# Patient Record
Sex: Male | Born: 1975 | Race: White | Hispanic: No | Marital: Married | Smoking: Never smoker
Health system: Southern US, Community
[De-identification: ages and names within clinical notes are randomized; demographics above are authoritative.]

## PROBLEM LIST (undated history)

## (undated) DIAGNOSIS — Z8619 Personal history of other infectious and parasitic diseases: Secondary | ICD-10-CM

## (undated) DIAGNOSIS — F32A Depression, unspecified: Secondary | ICD-10-CM

## (undated) DIAGNOSIS — F329 Major depressive disorder, single episode, unspecified: Secondary | ICD-10-CM

## (undated) DIAGNOSIS — M5126 Other intervertebral disc displacement, lumbar region: Secondary | ICD-10-CM

## (undated) DIAGNOSIS — M51369 Other intervertebral disc degeneration, lumbar region without mention of lumbar back pain or lower extremity pain: Secondary | ICD-10-CM

## (undated) DIAGNOSIS — K219 Gastro-esophageal reflux disease without esophagitis: Secondary | ICD-10-CM

## (undated) DIAGNOSIS — Z87442 Personal history of urinary calculi: Secondary | ICD-10-CM

## (undated) DIAGNOSIS — M5136 Other intervertebral disc degeneration, lumbar region: Secondary | ICD-10-CM

## (undated) HISTORY — PX: WISDOM TOOTH EXTRACTION: SHX21

## (undated) HISTORY — DX: Depression, unspecified: F32.A

## (undated) HISTORY — DX: Other intervertebral disc displacement, lumbar region: M51.26

## (undated) HISTORY — DX: Gastro-esophageal reflux disease without esophagitis: K21.9

## (undated) HISTORY — DX: Other intervertebral disc degeneration, lumbar region without mention of lumbar back pain or lower extremity pain: M51.369

## (undated) HISTORY — DX: Major depressive disorder, single episode, unspecified: F32.9

## (undated) HISTORY — DX: Personal history of other infectious and parasitic diseases: Z86.19

## (undated) HISTORY — DX: Other intervertebral disc degeneration, lumbar region: M51.36

---

## 2004-11-22 ENCOUNTER — Ambulatory Visit: Payer: Self-pay | Admitting: Internal Medicine

## 2005-02-13 ENCOUNTER — Ambulatory Visit: Payer: Self-pay | Admitting: Internal Medicine

## 2005-02-20 ENCOUNTER — Ambulatory Visit: Payer: Self-pay | Admitting: Internal Medicine

## 2006-01-08 ENCOUNTER — Ambulatory Visit: Payer: Self-pay | Admitting: Internal Medicine

## 2006-02-08 ENCOUNTER — Ambulatory Visit: Payer: Self-pay | Admitting: Internal Medicine

## 2006-07-11 ENCOUNTER — Ambulatory Visit: Payer: Self-pay | Admitting: Internal Medicine

## 2006-07-11 LAB — CONVERTED CEMR LAB: Influenza B Ag: NEGATIVE

## 2007-01-10 ENCOUNTER — Ambulatory Visit: Payer: Self-pay | Admitting: Internal Medicine

## 2007-02-21 ENCOUNTER — Ambulatory Visit: Payer: Self-pay | Admitting: Internal Medicine

## 2007-08-15 ENCOUNTER — Telehealth: Payer: Self-pay | Admitting: Internal Medicine

## 2007-08-15 ENCOUNTER — Encounter: Payer: Self-pay | Admitting: Internal Medicine

## 2008-02-05 ENCOUNTER — Ambulatory Visit: Payer: Self-pay | Admitting: Internal Medicine

## 2008-02-05 DIAGNOSIS — G4700 Insomnia, unspecified: Secondary | ICD-10-CM

## 2008-02-05 DIAGNOSIS — K5900 Constipation, unspecified: Secondary | ICD-10-CM | POA: Insufficient documentation

## 2008-05-25 ENCOUNTER — Telehealth: Payer: Self-pay | Admitting: Internal Medicine

## 2008-09-03 ENCOUNTER — Telehealth: Payer: Self-pay | Admitting: Internal Medicine

## 2009-05-04 ENCOUNTER — Ambulatory Visit: Payer: Self-pay | Admitting: Internal Medicine

## 2009-05-04 DIAGNOSIS — L259 Unspecified contact dermatitis, unspecified cause: Secondary | ICD-10-CM | POA: Insufficient documentation

## 2009-05-04 DIAGNOSIS — K589 Irritable bowel syndrome without diarrhea: Secondary | ICD-10-CM | POA: Insufficient documentation

## 2009-07-02 ENCOUNTER — Ambulatory Visit: Payer: Self-pay | Admitting: Internal Medicine

## 2009-07-02 DIAGNOSIS — E785 Hyperlipidemia, unspecified: Secondary | ICD-10-CM

## 2009-07-02 LAB — CONVERTED CEMR LAB
AST: 15 units/L (ref 0–37)
Albumin: 4.7 g/dL (ref 3.5–5.2)
Alkaline Phosphatase: 74 units/L (ref 39–117)
Calcium: 9.9 mg/dL (ref 8.4–10.5)
Creatinine, Ser: 0.96 mg/dL (ref 0.40–1.50)
HDL: 36 mg/dL — ABNORMAL LOW (ref >39)
Tissue Transglutaminase Ab, IgA: 0.4 units (ref <7)
Total Bilirubin: 0.5 mg/dL (ref 0.3–1.2)
Triglycerides: 131 mg/dL (ref <150)

## 2009-07-08 ENCOUNTER — Ambulatory Visit: Payer: Self-pay | Admitting: Internal Medicine

## 2009-07-08 DIAGNOSIS — F419 Anxiety disorder, unspecified: Secondary | ICD-10-CM

## 2009-07-08 DIAGNOSIS — F329 Major depressive disorder, single episode, unspecified: Secondary | ICD-10-CM

## 2009-10-26 ENCOUNTER — Telehealth (INDEPENDENT_AMBULATORY_CARE_PROVIDER_SITE_OTHER): Payer: Self-pay | Admitting: *Deleted

## 2009-11-24 ENCOUNTER — Telehealth: Payer: Self-pay | Admitting: Internal Medicine

## 2010-07-07 NOTE — Assessment & Plan Note (Signed)
Summary: cpx/hea   Vital Signs:  Patient profile:   35 year old male Weight:      199 pounds BMI:     28.66 O2 Sat:      100 % on Room air Temp:     97.4 degrees F oral Pulse rate:   76 / minute Pulse rhythm:   regular Resp:     16 per minute BP sitting:   130 / 80  (left arm) Cuff size:   large  Vitals Entered By: Glendell Docker CMA (July 08, 2009 8:27 AM)  O2 Flow:  Room air  Primary Care Provider:  D. Thomos Lemons DO  CC:  CPX.  History of Present Illness: CPX  35 y/o white male for routine CPX.   IBS - BMs are more regular.  no loose stools.  No blood in stools  Asthma - asymptomatic.  minimal wheezing with change of seasons atopic dermatitis -  xyzal did not help.  skin on upper neck and chest gets red when he feels stressed or nervous  Preventive Screening-Counseling & Management  Alcohol-Tobacco     Smoking Status: never  Allergies: No Known Drug Allergies  Past History:  Past Medical History: Asthma  GERD     Hx of Adjustment disorder with depressed mood IBS  Family History: Brother has hypothyroidism Mother - tobacco abuse, hypothyroidism    colon cancer - no prostate cancer - no    Social History: Occupation: Personnel officer Divorced Never Smoked Alcohol use-yes (occasional)      Physical Exam  General:  alert, well-developed, and well-nourished.   Head:  normocephalic and atraumatic.   Eyes:  pupils equal, pupils round, and pupils reactive to light.   Ears:  R ear normal and L ear normal.   Mouth:  Oral mucosa and oropharynx without lesions or exudates.  Teeth in good repair. Neck:  No deformities, masses, or tenderness noted. Lungs:  Normal respiratory effort, chest expands symmetrically. Lungs are clear to auscultation, no crackles or wheezes. Heart:  Normal rate and regular rhythm. S1 and S2 normal without gallop, murmur, click, rub or other extra sounds. Abdomen:  Bowel sounds positive,abdomen soft and non-tender without  masses, organomegaly or hernias noted. Pulses:  dorsalis pedis and posterior tibial pulses are full and equal bilaterally Extremities:  No lower extremity edema' Neurologic:  cranial nerves II-XII intact and gait normal.   Psych:  normally interactive, good eye contact, not anxious appearing, and not depressed appearing.     Impression & Recommendations:  Problem # 1:  PREVENTIVE HEALTH CARE (ICD-V70.0) Reviewed adult health maintenance protocols.  Td Booster: Historical (10/20/2003)   Flu Vax: Fluvax Non-MCR (07/08/2009)   Chol: 192 (07/02/2009)   HDL: 36 (07/02/2009)   LDL: 130 (07/02/2009)   TG: 131 (07/02/2009) TSH: 1.806 (07/02/2009)     Problem # 2:  ANXIETY (ICD-300.00) Pt gets very anxious before public speaking.  trial of low dose b blocker.  Complete Medication List: 1)  Qvar 40 Mcg/act Aers (Beclomethasone dipropionate) .... 2 puffs two times a day 2)  Proair Hfa 108 (90 Base) Mcg/act Aers (Albuterol sulfate) .... 2 puffs q 4-6 hrs as needed 3)  Triamcinolone Acetonide 0.1 % Crea (Triamcinolone acetonide) .... Apply two times a day as directed x 2 weeks 4)  Xyzal 5 Mg Tabs (Levocetirizine dihydrochloride) .... One by mouth once daily 5)  Metoprolol Tartrate 25 Mg Tabs (Metoprolol tartrate) .... 1/2 to one tab once daily as directed  Other Orders: Influenza  Vaccine NON MCR [phone]) Admin 1st Vaccine ([id number])  Patient Instructions: 1)  Please schedule a follow-up appointment as needed. Prescriptions: METOPROLOL TARTRATE 25 MG TABS (METOPROLOL TARTRATE) 1/2 to one tab once daily as directed  #30 x 1   Entered and Authorized by:   D. Thomos Lemons DO   Signed by:   D. Thomos Lemons DO on 07/08/2009   Method used:   Print then Give to Patient   RxID:   [id number]    Immunization History:  Tetanus/Td Immunization History:    Tetanus/Td:  historical (10/20/2003)  Immunizations Administered:  Influenza Vaccine # 1:    Vaccine Type: Fluvax Non-MCR    Site: right  deltoid    Mfr: GlaxoSmithKline    Dose: 0.5 ml    Route: IM    Given by: Glendell Docker CMA    Exp. Date: 12/02/2009    Lot #: [id number]    VIS given: 01/12/2009  Flu Vaccine Consent Questions:    Do you have a history of severe allergic reactions to this vaccine? no    Any prior history of allergic reactions to egg and/or gelatin? no    Do you have a sensitivity to the preservative Thimersol? no    Do you have a past history of Guillan-Barre Syndrome? no    Do you currently have an acute febrile illness? no    Have you ever had a severe reaction to latex? no    Vaccine information given and explained to patient? yes

## 2010-07-07 NOTE — Progress Notes (Signed)
  Phone Note Other Incoming   Request: Send information Summary of Call: Request for records received from [facility].Request forwarded to [facility].

## 2010-07-07 NOTE — Progress Notes (Signed)
Summary: Proair Inhaler  Phone Note Refill Request Message from:  Fax from Pharmacy on November 24, 2009 12:43 PM  Refills Requested: Medication #1:  PROAIR HFA 108 (90 BASE) MCG/ACT AERS 2 puffs q 4-6 hrs as needed   Dosage confirmed as above?Dosage Confirmed   Brand Name Necessary? No   Supply Requested: 1 month   Last Refilled: 05/02/2009  Method Requested: Electronic Next Appointment Scheduled: None Initial call taken by: Glendell Docker CMA,  November 24, 2009 12:44 PM  Follow-up for Phone Call        Pt going out of town tomorrow at [employer], needs Rx  Follow-up by: Lannette Donath,  November 25, 2009 4:46 PM  Additional Follow-up for Phone Call Additional follow up Details #1::        patient advised rx sent to pharmacy Additional Follow-up by: Glendell Docker CMA,  November 26, 2009 1:59 PM    Prescriptions: PROAIR HFA 108 (90 BASE) MCG/ACT AERS (ALBUTEROL SULFATE) 2 puffs q 4-6 hrs as needed  #1 x 5   Entered and Authorized by:   D. Thomos Lemons DO   Signed by:   D. Thomos Lemons DO on 11/25/2009   Method used:   [facility]  [address] [id number]       [address]       [place], [place]  [address]       Ph: [phone] or [phone]       Fax: [phone]   RxID:   [id number]

## 2011-03-10 ENCOUNTER — Telehealth: Payer: Self-pay | Admitting: Internal Medicine

## 2011-03-10 NOTE — Telephone Encounter (Signed)
Pt needs an OV first

## 2011-03-10 NOTE — Telephone Encounter (Signed)
Refill- Proair HFA inhaler. Use 2 puffs by mouth every 4-6 hours as needed. Qty 8.5gm. Last fill 4.21.12

## 2011-03-13 NOTE — Telephone Encounter (Signed)
ok 

## 2011-03-13 NOTE — Telephone Encounter (Signed)
Pt has scheduled an appt for 03/24/11 but said to disregard refill because he would like to talk to Dr.Yoo about changing the inhaler

## 2011-03-17 ENCOUNTER — Other Ambulatory Visit: Payer: Self-pay | Admitting: Internal Medicine

## 2011-03-17 MED ORDER — ALBUTEROL SULFATE HFA 108 (90 BASE) MCG/ACT IN AERS: 2.0000 | INHALATION_SPRAY | Freq: Four times a day (QID) | RESPIRATORY_TRACT | Status: DC | PRN

## 2011-03-17 NOTE — Telephone Encounter (Signed)
rx sent in electronically 

## 2011-03-17 NOTE — Telephone Encounter (Signed)
Pt need refill on pro-air inhaler call [facility] [address] [phone]. Pt appt was rsc until 04-17-2011.

## 2011-03-24 ENCOUNTER — Ambulatory Visit: Payer: Self-pay | Admitting: Internal Medicine

## 2011-04-17 ENCOUNTER — Ambulatory Visit: Payer: Self-pay | Admitting: Internal Medicine

## 2011-07-18 ENCOUNTER — Ambulatory Visit (INDEPENDENT_AMBULATORY_CARE_PROVIDER_SITE_OTHER): Payer: BC Managed Care – PPO | Admitting: General Surgery

## 2011-07-18 ENCOUNTER — Encounter (INDEPENDENT_AMBULATORY_CARE_PROVIDER_SITE_OTHER): Payer: Self-pay

## 2011-07-18 ENCOUNTER — Encounter (INDEPENDENT_AMBULATORY_CARE_PROVIDER_SITE_OTHER): Payer: Self-pay | Admitting: General Surgery

## 2011-07-18 VITALS — BP 150/92 | HR 96 | Resp 16 | Ht 73.0 in | Wt 199.0 lb

## 2011-07-18 DIAGNOSIS — K645 Perianal venous thrombosis: Secondary | ICD-10-CM

## 2011-07-18 NOTE — Patient Instructions (Signed)

## 2011-07-18 NOTE — Progress Notes (Signed)
Subjective:     Patient ID: Kyle Miller, male   DOB: Jan 28, 1976, 36 y.o.   MRN: [id number]  HPI 38 yom with history of prior external thrombosed hemorrhoid who presents with less than 24 hour history of rectal pain and mass.  Denies bleeding.  Having bowel movements.  He thinks this may be in same position.  He does have history of straining associated with this.  No fevers.  Review of Systems  Constitutional: Negative for fever, chills and unexpected weight change.  HENT: Negative for hearing loss, congestion, sore throat, trouble swallowing and voice change.   Eyes: Negative for visual disturbance.  Respiratory: Negative for cough and wheezing.   Cardiovascular: Negative for chest pain, palpitations and leg swelling.  Gastrointestinal: Positive for rectal pain. Negative for nausea, vomiting, abdominal pain, diarrhea, constipation, blood in stool, abdominal distention and anal bleeding.  Genitourinary: Negative for hematuria and difficulty urinating.  Musculoskeletal: Negative for arthralgias.  Skin: Negative for rash and wound.  Neurological: Negative for seizures, syncope, weakness and headaches.  Hematological: Negative for adenopathy. Does not bruise/bleed easily.  Psychiatric/Behavioral: Negative for confusion.       Objective:   Physical Exam  Vitals reviewed. Genitourinary: Rectal exam shows external hemorrhoid (tender right posterior external hemorrhoid with some necrosis, thrombosed).       Assessment:     Thrombosed external hemorrhoid    Plan:     We discussed option of observation vs evacuation vs excision. We decided on clot evacuation for symptoms.  I anesthetized this area with lidocaine and then made an incision.  I then evacuated the clot.  Dressing was placed. Return in 3 weeks or call for any problems.

## 2011-11-01 ENCOUNTER — Ambulatory Visit (INDEPENDENT_AMBULATORY_CARE_PROVIDER_SITE_OTHER): Payer: Self-pay | Admitting: Internal Medicine

## 2011-11-01 ENCOUNTER — Encounter: Payer: Self-pay | Admitting: Internal Medicine

## 2011-11-01 VITALS — BP 144/88 | HR 76 | Temp 98.1°F | Wt 191.0 lb

## 2011-11-01 DIAGNOSIS — G4700 Insomnia, unspecified: Secondary | ICD-10-CM

## 2011-11-01 DIAGNOSIS — R39198 Other difficulties with micturition: Secondary | ICD-10-CM

## 2011-11-01 DIAGNOSIS — R3912 Poor urinary stream: Secondary | ICD-10-CM

## 2011-11-01 DIAGNOSIS — R6882 Decreased libido: Secondary | ICD-10-CM

## 2011-11-01 DIAGNOSIS — F411 Generalized anxiety disorder: Secondary | ICD-10-CM

## 2011-11-01 LAB — POCT URINALYSIS DIPSTICK
Bilirubin, UA: NEGATIVE
Glucose, UA: NEGATIVE
Ketones, UA: NEGATIVE
Spec Grav, UA: 1.015

## 2011-11-01 MED ORDER — SERTRALINE HCL 25 MG PO TABS: 25.0000 mg | ORAL_TABLET | Freq: Every day | ORAL | Status: DC

## 2011-11-01 MED ORDER — CLONAZEPAM 0.5 MG PO TABS: 0.5000 mg | ORAL_TABLET | Freq: Every evening | ORAL | Status: DC | PRN

## 2011-11-01 MED ORDER — ALBUTEROL SULFATE HFA 108 (90 BASE) MCG/ACT IN AERS: 2.0000 | INHALATION_SPRAY | Freq: Four times a day (QID) | RESPIRATORY_TRACT | Status: DC | PRN

## 2011-11-01 MED ORDER — CIPROFLOXACIN HCL 250 MG PO TABS: 250.0000 mg | ORAL_TABLET | Freq: Two times a day (BID) | ORAL | Status: AC

## 2011-11-01 NOTE — Progress Notes (Signed)
  Subjective:    Patient ID: Kyle Miller, male    DOB: 1975/10/09, 36 y.o.   MRN: [id number]  HPI  36 year old white male with history of asthma returns with multiple complaints. First, patient requests refill for albuterol metered-dose inhaler. He denies frequent asthma flares. It has been over 5-6 months since he last used albuterol.  Patient has been concerned with new genitourinary symptoms. Patient complains of weaker urinary stream. Patient also feels like his testicles and penis have gotten smaller. He denies any significant issues with sexual function. Question mild decrease in libido. He works third shift and has difficulty sleeping. He averages 3 hours per night.  He has chronic history of anxiety disorder. He is not  taking any medication. He has been worrying excessively about what is causing his genitourinary issues. He denies any new sexual partners. No dysuria or testicular pain.   Review of Systems Negative for fever or chills,  No urinary frequency  Past Medical History  Diagnosis Date  . Asthma     History   Social History  . Marital Status: Married    Spouse Name: N/A    Number of Children: N/A  . Years of Education: N/A   Occupational History  . Not on file.   Social History Main Topics  . Smoking status: Never Smoker   . Smokeless tobacco: Not on file  . Alcohol Use: Yes     socially  . Drug Use: No  . Sexually Active: Not on file   Other Topics Concern  . Not on file   Social History Narrative  . No narrative on file    No past surgical history on file.  No family history on file.  No Known Allergies  Current Outpatient Prescriptions on File Prior to Visit  Medication Sig Dispense Refill  . clonazePAM (KLONOPIN) 0.5 MG tablet Take 1 tablet (0.5 mg total) by mouth at bedtime as needed (as needed for sleep).  30 tablet  1  . sertraline (ZOLOFT) 25 MG tablet Take 1 tablet (25 mg total) by mouth daily.  30 tablet  2    BP 144/88  Pulse 76   Temp(Src) 98.1 F (36.7 C) (Oral)  Wt 191 lb (86.637 kg)       Objective:   Physical Exam  Constitutional: He is oriented to person, place, and time. He appears well-developed and well-nourished.  Cardiovascular: Normal rate, regular rhythm and normal heart sounds.   No murmur heard. Pulmonary/Chest: Effort normal and breath sounds normal. He has no wheezes.  Genitourinary: Rectum normal and penis normal.       Slightly boggy prostate gland.  No asymmetry or prostate nodules  Neurological: He is alert and oriented to person, place, and time.  Skin: Skin is warm and dry.  Psychiatric:       Appears anxious          Assessment & Plan:

## 2011-11-01 NOTE — Assessment & Plan Note (Signed)
Gen. exam is normal. Rule out hypogonadism. Obtain testosterone level. The symptoms may be anxiety related.

## 2011-11-01 NOTE — Assessment & Plan Note (Signed)
Restart SSRI. Sertraline 25 mg once daily.

## 2011-11-01 NOTE — Assessment & Plan Note (Addendum)
Patient could not tolerate zolpidem in the past.  It caused hallucinations. Trial of low-dose clonazepam. His insomnia is exacerbated by work schedule. He works third shift.

## 2011-11-01 NOTE — Patient Instructions (Signed)
Avoid over the counter decongestants and caffeinated beverages.

## 2011-11-01 NOTE — Assessment & Plan Note (Signed)
36 year old white male with symptoms of weak urinary stream. On exam patient has slightly boggy prostate. Consider mild prostatitis. Treat with Cipro 250 mg twice daily x10 days. Patient also advised to avoid decongestants and caffeinated beverages.

## 2011-11-02 LAB — CBC WITH DIFFERENTIAL/PLATELET
Basophils Absolute: 0 10*3/uL (ref 0.0–0.1)
Eosinophils Absolute: 0.2 10*3/uL (ref 0.0–0.7)
Hemoglobin: 15.2 g/dL (ref 13.0–17.0)
Lymphocytes Relative: 21.7 % (ref 12.0–46.0)
Lymphs Abs: 2.1 10*3/uL (ref 0.7–4.0)
MCHC: 32.5 g/dL (ref 30.0–36.0)
MCV: 92 fl (ref 78.0–100.0)
Monocytes Absolute: 0.5 10*3/uL (ref 0.1–1.0)
Neutro Abs: 7 10*3/uL (ref 1.4–7.7)
RDW: 13.2 % (ref 11.5–14.6)

## 2011-11-02 LAB — TESTOSTERONE, FREE, TOTAL, SHBG: Sex Hormone Binding: 40 nmol/L (ref 13–71)

## 2012-01-22 ENCOUNTER — Telehealth: Payer: Self-pay | Admitting: Internal Medicine

## 2012-01-22 NOTE — Telephone Encounter (Signed)
Pt calling in for lab results from 07/02/2009 for Wellness program at work.  Pt needed HDL, Triglycerides, LDL, and Glucose level. No triage.

## 2012-02-13 ENCOUNTER — Ambulatory Visit (INDEPENDENT_AMBULATORY_CARE_PROVIDER_SITE_OTHER): Payer: Self-pay | Admitting: Family

## 2012-02-13 ENCOUNTER — Encounter: Payer: Self-pay | Admitting: Family

## 2012-02-13 VITALS — BP 130/86 | HR 65 | Temp 98.0°F | Resp 18 | Wt 198.0 lb

## 2012-02-13 DIAGNOSIS — H6090 Unspecified otitis externa, unspecified ear: Secondary | ICD-10-CM

## 2012-02-13 DIAGNOSIS — H6692 Otitis media, unspecified, left ear: Secondary | ICD-10-CM | POA: Insufficient documentation

## 2012-02-13 DIAGNOSIS — H60399 Other infective otitis externa, unspecified ear: Secondary | ICD-10-CM

## 2012-02-13 DIAGNOSIS — H6690 Otitis media, unspecified, unspecified ear: Secondary | ICD-10-CM

## 2012-02-13 MED ORDER — CIPROFLOXACIN-HYDROCORTISONE 0.2-1 % OT SUSP: 3.0000 [drp] | Freq: Two times a day (BID) | OTIC | Status: AC

## 2012-02-13 MED ORDER — AMOXICILLIN-POT CLAVULANATE 875-125 MG PO TABS: 1.0000 | ORAL_TABLET | Freq: Two times a day (BID) | ORAL | Status: AC

## 2012-02-13 NOTE — Progress Notes (Signed)
  Subjective:    Patient ID: Kyle Miller, male    DOB: Sep 24, 1975, 36 y.o.   MRN: [id number]  HPI  Mr.  Miller is a 36 yr old male who presents today with chief complaint of left ear pain.  Pain has been present x 2 weeks.  He completed a zpak with slight improvement in his symptoms.  However, yesterday he developed difficulty hearing out of the left ear.   Mild nasal drainage- clear.  Denies associated fever.  Has had sharp shooting pains out of the left ear.   Review of Systems    see HPI  Past Medical History  Diagnosis Date  . Asthma     History   Social History  . Marital Status: Married    Spouse Name: N/A    Number of Children: N/A  . Years of Education: N/A   Occupational History  . Not on file.   Social History Main Topics  . Smoking status: Never Smoker   . Smokeless tobacco: Never Used  . Alcohol Use: Yes     socially  . Drug Use: No  . Sexually Active: Not on file   Other Topics Concern  . Not on file   Social History Narrative  . No narrative on file    No past surgical history on file.  No family history on file.  No Known Allergies  Current Outpatient Prescriptions on File Prior to Visit  Medication Sig Dispense Refill  . albuterol (PROVENTIL HFA;VENTOLIN HFA) 108 (90 BASE) MCG/ACT inhaler Inhale 2 puffs into the lungs every 6 (six) hours as needed for wheezing.  1 Inhaler  5  . DISCONTD: sertraline (ZOLOFT) 25 MG tablet Take 1 tablet (25 mg total) by mouth daily.  30 tablet  2  . clonazePAM (KLONOPIN) 0.5 MG tablet Take 1 tablet (0.5 mg total) by mouth at bedtime as needed (as needed for sleep).  30 tablet  1    BP 130/86  Pulse 65  Temp 98 F (36.7 C) (Oral)  Resp 18  Wt 198 lb 0.6 oz (89.83 kg)  SpO2 99%    Objective:   Physical Exam  Constitutional: He is oriented to person, place, and time. He appears well-developed and well-nourished. No distress.  HENT:  Head: Normocephalic and atraumatic.  Right Ear: Hearing and tympanic  membrane normal.  Left Ear: Tympanic membrane is erythematous and bulging.  Mouth/Throat: No oropharyngeal exudate, posterior oropharyngeal edema or posterior oropharyngeal erythema.       White exudate in left canal with erythematous canal.    Cardiovascular: Normal rate and regular rhythm.   No murmur heard. Pulmonary/Chest: Effort normal and breath sounds normal. No respiratory distress. He has no wheezes. He has no rales. He exhibits no tenderness.  Neurological: He is alert and oriented to person, place, and time.  Skin: Skin is warm and dry. No rash noted. No erythema. No pallor.  Psychiatric: He has a normal mood and affect. His behavior is normal. Judgment and thought content normal.          Assessment & Plan:

## 2012-02-13 NOTE — Assessment & Plan Note (Signed)
Trial of cipro HC otic drops to left ear.

## 2012-02-13 NOTE — Assessment & Plan Note (Signed)
Will rx with Augmentin. 

## 2012-02-13 NOTE — Patient Instructions (Addendum)
Please call if symptoms worsen or if no improvement in 2-3 days.  

## 2012-10-03 ENCOUNTER — Ambulatory Visit (INDEPENDENT_AMBULATORY_CARE_PROVIDER_SITE_OTHER): Payer: BC Managed Care – PPO | Admitting: Internal Medicine

## 2012-10-03 ENCOUNTER — Encounter: Payer: Self-pay | Admitting: Internal Medicine

## 2012-10-03 VITALS — BP 138/98 | Temp 98.3°F | Ht 73.0 in | Wt 207.0 lb

## 2012-10-03 DIAGNOSIS — N419 Inflammatory disease of prostate, unspecified: Secondary | ICD-10-CM

## 2012-10-03 DIAGNOSIS — R5381 Other malaise: Secondary | ICD-10-CM

## 2012-10-03 DIAGNOSIS — F411 Generalized anxiety disorder: Secondary | ICD-10-CM

## 2012-10-03 DIAGNOSIS — R6882 Decreased libido: Secondary | ICD-10-CM

## 2012-10-03 DIAGNOSIS — R5383 Other fatigue: Secondary | ICD-10-CM

## 2012-10-03 LAB — POCT URINALYSIS DIPSTICK
Bilirubin, UA: NEGATIVE
Blood, UA: NEGATIVE
Ketones, UA: NEGATIVE
Leukocytes, UA: NEGATIVE
pH, UA: 6

## 2012-10-03 LAB — CBC WITH DIFFERENTIAL/PLATELET
Eosinophils Absolute: 0.6 10*3/uL (ref 0.0–0.7)
MCHC: 33.7 g/dL (ref 30.0–36.0)
MCV: 88.6 fl (ref 78.0–100.0)
Monocytes Absolute: 0.6 10*3/uL (ref 0.1–1.0)
Neutrophils Relative %: 59.1 % (ref 43.0–77.0)
Platelets: 308 10*3/uL (ref 150.0–400.0)
WBC: 10.3 10*3/uL (ref 4.5–10.5)

## 2012-10-03 LAB — BASIC METABOLIC PANEL
BUN: 8 mg/dL (ref 6–23)
Chloride: 102 mEq/L (ref 96–112)
Creatinine, Ser: 1 mg/dL (ref 0.4–1.5)

## 2012-10-03 LAB — HEPATIC FUNCTION PANEL
Bilirubin, Direct: 0.1 mg/dL (ref 0.0–0.3)
Total Bilirubin: 0.8 mg/dL (ref 0.3–1.2)

## 2012-10-03 LAB — T4, FREE: Free T4: 0.87 ng/dL (ref 0.60–1.60)

## 2012-10-03 MED ORDER — CIPROFLOXACIN HCL 500 MG PO TABS: 500.0000 mg | ORAL_TABLET | Freq: Two times a day (BID) | ORAL | Status: DC

## 2012-10-03 MED ORDER — CLONAZEPAM 1 MG PO TABS: 1.0000 mg | ORAL_TABLET | Freq: Every evening | ORAL | Status: DC | PRN

## 2012-10-03 MED ORDER — BUPROPION HCL ER (XL) 150 MG PO TB24: 150.0000 mg | ORAL_TABLET | Freq: Every day | ORAL | Status: DC

## 2012-10-03 MED ORDER — ALBUTEROL SULFATE HFA 108 (90 BASE) MCG/ACT IN AERS: 2.0000 | INHALATION_SPRAY | Freq: Four times a day (QID) | RESPIRATORY_TRACT | Status: DC | PRN

## 2012-10-03 NOTE — Progress Notes (Signed)
  Subjective:    Patient ID: Kyle Miller, male    DOB: 07-29-1975, 37 y.o.   MRN: [id number]  HPI  37 year old white male with history of anxiety presents with fatigue and mood swings. His symptoms have been ongoing for several months. He also reports intermittent urinary symptoms. He has occasional weak urine stream. He also complains of hot flashes and feeling cold. He is also experienced occasional hip pain and low back pain.  He has history of anxiety and was previously started on Zoloft. Zoloft 25 mg caused significant diarrhea. His symptoms resolved after discontinuing medication.  He has chronic anxiety. He also works different shifts.  He reports very poor sleep quality.  "I am lucky if I sleep 3 hrs per night."  Review of Systems Negative for fever or chills,  Chronic insomnia    Past Medical History  Diagnosis Date  . Asthma     History   Social History  . Marital Status: Married    Spouse Name: N/A    Number of Children: N/A  . Years of Education: N/A   Occupational History  . Not on file.   Social History Main Topics  . Smoking status: Never Smoker   . Smokeless tobacco: Never Used  . Alcohol Use: Yes     Comment: socially  . Drug Use: No  . Sexually Active: Not on file   Other Topics Concern  . Not on file   Social History Narrative  . No narrative on file    No past surgical history on file.  No family history on file.  No Known Allergies  No current outpatient prescriptions on file prior to visit.   No current facility-administered medications on file prior to visit.    BP 138/98  Temp(Src) 98.3 F (36.8 C) (Oral)  Ht 6\' 1"  (1.854 m)  Wt 207 lb (93.895 kg)  BMI 27.32 kg/m2    Objective:   Physical Exam  Constitutional: He is oriented to person, place, and time. He appears well-developed.  HENT:  Head: Normocephalic and atraumatic.  Right Ear: External ear normal.  Left Ear: External ear normal.  Mouth/Throat: Oropharynx is clear  and moist.  Cardiovascular: Normal rate, regular rhythm and normal heart sounds.   Pulmonary/Chest: Effort normal. He has no wheezes.  Abdominal: Soft. Bowel sounds are normal. He exhibits no mass. There is no tenderness.  Genitourinary: Rectum normal.  Slightly boggy tender prostate  Neurological: He is alert and oriented to person, place, and time. No cranial nerve deficit.  Skin: Skin is warm and dry.  Psychiatric: He has a normal mood and affect. His behavior is normal.          Assessment & Plan:

## 2012-10-03 NOTE — Assessment & Plan Note (Signed)
Testosterone levels were normal.  His symptoms likely secondary to stress.

## 2012-10-03 NOTE — Assessment & Plan Note (Signed)
Patient could not tolerate sertraline due to side effect of diarrhea. Trial of Wellbutrin XL 150 mg once daily.  Patient with symptoms of apathy and lethargy.  Rule out metabolic etiology.  He reports family hx of hypothyroidism.   Poor sleep quality due to the fact patient works different shifts likely contributing to mood issues.  He could not tolerate Ambien. Use clonazepam 0.5-1 mg at bedtime as needed

## 2012-10-03 NOTE — Assessment & Plan Note (Signed)
37 year old white male with intermittent urinary symptoms. He also has associated low back pain and hip pain. He has tender boggy prostate on exam. Treat with ciprofloxacin 500 mg twice daily for 10 days.

## 2012-11-07 ENCOUNTER — Ambulatory Visit: Payer: BC Managed Care – PPO | Admitting: Internal Medicine

## 2012-11-25 ENCOUNTER — Ambulatory Visit: Payer: BC Managed Care – PPO | Admitting: Internal Medicine

## 2013-01-08 ENCOUNTER — Other Ambulatory Visit: Payer: Self-pay | Admitting: Internal Medicine

## 2013-02-07 ENCOUNTER — Ambulatory Visit (INDEPENDENT_AMBULATORY_CARE_PROVIDER_SITE_OTHER): Payer: BC Managed Care – PPO | Admitting: Internal Medicine

## 2013-02-07 ENCOUNTER — Encounter: Payer: Self-pay | Admitting: Internal Medicine

## 2013-02-07 VITALS — BP 128/90 | Temp 98.0°F | Wt 202.0 lb

## 2013-02-07 DIAGNOSIS — F411 Generalized anxiety disorder: Secondary | ICD-10-CM

## 2013-02-07 DIAGNOSIS — N419 Inflammatory disease of prostate, unspecified: Secondary | ICD-10-CM

## 2013-02-07 DIAGNOSIS — G4700 Insomnia, unspecified: Secondary | ICD-10-CM

## 2013-02-07 DIAGNOSIS — Z23 Encounter for immunization: Secondary | ICD-10-CM

## 2013-02-07 DIAGNOSIS — J45909 Unspecified asthma, uncomplicated: Secondary | ICD-10-CM

## 2013-02-07 LAB — POCT URINALYSIS DIPSTICK
Bilirubin, UA: NEGATIVE
Blood, UA: NEGATIVE
Glucose, UA: NEGATIVE
Nitrite, UA: NEGATIVE

## 2013-02-07 MED ORDER — BECLOMETHASONE DIPROPIONATE 80 MCG/ACT IN AERS: 2.0000 | INHALATION_SPRAY | Freq: Two times a day (BID) | RESPIRATORY_TRACT | Status: DC

## 2013-02-07 MED ORDER — ALBUTEROL SULFATE HFA 108 (90 BASE) MCG/ACT IN AERS: 2.0000 | INHALATION_SPRAY | Freq: Four times a day (QID) | RESPIRATORY_TRACT | Status: DC | PRN

## 2013-02-07 MED ORDER — PANTOPRAZOLE SODIUM 40 MG PO TBEC: 40.0000 mg | DELAYED_RELEASE_TABLET | Freq: Every day | ORAL | Status: DC

## 2013-02-07 MED ORDER — CIPROFLOXACIN HCL 500 MG PO TABS: 500.0000 mg | ORAL_TABLET | Freq: Two times a day (BID) | ORAL | Status: DC

## 2013-02-07 MED ORDER — BUPROPION HCL ER (XL) 150 MG PO TB24: 150.0000 mg | ORAL_TABLET | ORAL | Status: DC

## 2013-02-07 MED ORDER — CLONAZEPAM 1 MG PO TABS: 1.0000 mg | ORAL_TABLET | Freq: Every evening | ORAL | Status: DC | PRN

## 2013-02-07 NOTE — Assessment & Plan Note (Signed)
Patient experiencing recurrence of prostatitis. Treat with ciprofloxacin 500 mg twice daily for 3 weeks.

## 2013-02-07 NOTE — Progress Notes (Signed)
  Subjective:    Patient ID: Kyle Miller, male    DOB: 06-22-1975, 37 y.o.   MRN: [id number]  HPI  37 year old white male with history of anxiety/fatigue and mood swings for followup. Patient previously started on Wellbutrin XL 150 mg. Patient reports energy levels have improved as his anxiety symptoms. He has been monitoring his blood pressure at home and they're somewhat labile.  He was previously treated for possible prostatitis. He reports urinary symptoms promptly resolved after taking course of ciprofloxacin for 10 days.  Unfortunately patient reports prostate symptoms recurred 2 weeks ago. He has intermittent pelvic discomfort and low back pain. His urine flow also feels "tight". He has pelvic muscle tightness. He denies any new sexual contacts. No joint pains or low-grade fever.  History of asthma -patient has been using his inhaler more than usual the summer. It has slowed down over the last one month. He reports chronic reflux symptoms. He takes over-the-counter proton pump inhibitor when necessary.  Review of Systems Negative for cough or shortness of breath. No hx of STDs Negative for chest pain  Past Medical History  Diagnosis Date  . Asthma     History   Social History  . Marital Status: Married    Spouse Name: N/A    Number of Children: N/A  . Years of Education: N/A   Occupational History  . Not on file.   Social History Main Topics  . Smoking status: Never Smoker   . Smokeless tobacco: Never Used  . Alcohol Use: Yes     Comment: socially  . Drug Use: No  . Sexual Activity: Not on file   Other Topics Concern  . Not on file   Social History Narrative  . No narrative on file    No past surgical history on file.  No family history on file.  No Known Allergies  No current outpatient prescriptions on file prior to visit.   No current facility-administered medications on file prior to visit.    BP 128/90  Temp(Src) 98 F (36.7 C) (Oral)  Wt  202 lb (91.627 kg)  BMI 26.66 kg/m2       Objective:   Physical Exam  Constitutional: He is oriented to person, place, and time. He appears well-developed and well-nourished.  HENT:  Head: Normocephalic and atraumatic.  Neck: Neck supple.  Cardiovascular: Normal rate and normal heart sounds.   Pulmonary/Chest: Effort normal and breath sounds normal. He has no wheezes.  Abdominal: Soft. Bowel sounds are normal.  Genitourinary: Rectum normal.  Prostate boggy and tender  Musculoskeletal: He exhibits no edema.  Lymphadenopathy:    He has no cervical adenopathy.  Neurological: He is alert and oriented to person, place, and time. No cranial nerve deficit.  Skin: Skin is warm and dry.  Psychiatric: He has a normal mood and affect. His behavior is normal.          Assessment & Plan:

## 2013-02-07 NOTE — Assessment & Plan Note (Signed)
Improved with Wellbutrin XL 150 mg. Continue same dose.

## 2013-02-07 NOTE — Assessment & Plan Note (Addendum)
Patient reports more exacerbations early this summer. He has associated reflux. Start protonix 40 mg once daily. Also start Qvar 80 mcg 2 puffs twice daily. Reassess in 2 months.  Consider spirometry at next office visit.

## 2013-02-07 NOTE — Assessment & Plan Note (Signed)
Patient using clonazepam 1 mg 1/2-1 tablet as needed. Insomnia sometimes exacerbated by shift work. Continue same dose of clonazepam.

## 2013-02-10 ENCOUNTER — Telehealth: Payer: Self-pay | Admitting: Internal Medicine

## 2013-02-10 MED ORDER — CIPROFLOXACIN HCL 500 MG PO TABS: 500.0000 mg | ORAL_TABLET | Freq: Two times a day (BID) | ORAL | Status: DC

## 2013-02-10 NOTE — Telephone Encounter (Signed)
rx corrected and sent in electronically 

## 2013-02-10 NOTE — Telephone Encounter (Signed)
It is actually 500 mg bid for 21 days, so call in Qty 42.  Thank you.

## 2013-02-10 NOTE — Telephone Encounter (Signed)
[facility] that [facility] [address] [facility] his [facility]floxacin (CIPRO) 500 MG tablet incorrectly. He states that he was supposed to take this 2 times daily for 28 days, but he only received 28 pills. When he made the pharmacy aware of this mistake, they requested that he let us know so that we could send another RX over. Please assist.

## 2013-05-10 ENCOUNTER — Other Ambulatory Visit: Payer: Self-pay | Admitting: Internal Medicine

## 2013-05-12 ENCOUNTER — Other Ambulatory Visit: Payer: Self-pay | Admitting: Internal Medicine

## 2013-07-02 ENCOUNTER — Encounter: Payer: Self-pay | Admitting: Internal Medicine

## 2013-07-02 ENCOUNTER — Ambulatory Visit (INDEPENDENT_AMBULATORY_CARE_PROVIDER_SITE_OTHER): Payer: BC Managed Care – PPO | Admitting: Internal Medicine

## 2013-07-02 VITALS — BP 132/98 | HR 80 | Temp 97.8°F | Ht 73.0 in | Wt 207.0 lb

## 2013-07-02 DIAGNOSIS — M791 Myalgia, unspecified site: Secondary | ICD-10-CM

## 2013-07-02 DIAGNOSIS — M6281 Muscle weakness (generalized): Secondary | ICD-10-CM | POA: Insufficient documentation

## 2013-07-02 DIAGNOSIS — N419 Inflammatory disease of prostate, unspecified: Secondary | ICD-10-CM

## 2013-07-02 DIAGNOSIS — IMO0001 Reserved for inherently not codable concepts without codable children: Secondary | ICD-10-CM

## 2013-07-02 LAB — BASIC METABOLIC PANEL
BUN: 8 mg/dL (ref 6–23)
CHLORIDE: 102 meq/L (ref 96–112)
CO2: 31 meq/L (ref 19–32)
Calcium: 9.4 mg/dL (ref 8.4–10.5)
Creatinine, Ser: 1 mg/dL (ref 0.4–1.5)
GFR: 90.46 mL/min (ref >60.00)
GLUCOSE: 82 mg/dL (ref 70–99)
POTASSIUM: 3.9 meq/L (ref 3.5–5.1)
SODIUM: 139 meq/L (ref 135–145)

## 2013-07-02 LAB — T4, FREE: Free T4: 0.7 ng/dL (ref 0.60–1.60)

## 2013-07-02 LAB — SEDIMENTATION RATE: Sed Rate: 1 mm/hr (ref 0–22)

## 2013-07-02 LAB — HEPATIC FUNCTION PANEL
ALK PHOS: 73 U/L (ref 39–117)
ALT: 33 U/L (ref 0–53)
AST: 20 U/L (ref 0–37)
Albumin: 4.4 g/dL (ref 3.5–5.2)
BILIRUBIN DIRECT: 0 mg/dL (ref 0.0–0.3)
BILIRUBIN TOTAL: 0.6 mg/dL (ref 0.3–1.2)
Total Protein: 7.6 g/dL (ref 6.0–8.3)

## 2013-07-02 LAB — CK: Total CK: 98 U/L (ref 7–232)

## 2013-07-02 LAB — TSH: TSH: 0.66 u[IU]/mL (ref 0.35–5.50)

## 2013-07-02 MED ORDER — DOXYCYCLINE HYCLATE 100 MG PO TABS: 100.0000 mg | ORAL_TABLET | Freq: Two times a day (BID) | ORAL | Status: DC

## 2013-07-02 NOTE — Progress Notes (Signed)
Pre visit review using our clinic review tool, if applicable. No additional management support is needed unless otherwise documented below in the visit note. 

## 2013-07-02 NOTE — Assessment & Plan Note (Signed)
38 year old white male with unexplained lower extremity weakness after starting Wellbutrin. Patient also unclear whether symptoms started after getting influenza vaccine. He has mild fatigability with performing multiple squats on exam. Otherwise his neurologic exam is unremarkable. Check CPK, sedimentation rate and thyroid studies. Refer to neurology for further evaluation/possible EMG.

## 2013-07-02 NOTE — Assessment & Plan Note (Signed)
Patient with recurrent symptoms.  Treat with doxycycline 100 mg bid for 3 weeks.

## 2013-07-02 NOTE — Progress Notes (Signed)
Subjective:    Patient ID: Kyle Miller, male    DOB: December 16, 1975, 38 y.o.   MRN: [id number]  HPI  38 year old white male with history of mild asthma since an anxiety disorder for routine followup. Patient treated for possible mild prostatitis at previous visit. His symptoms resolved with finishing course of Cipro however symptoms of weak urine stream and urinary frequency have returned.  Patient reports discontinuing Wellbutrin XL 150 mg. Patient reports his muscles started aching after starting medication. Patient reports symptoms somewhat improved after stopping Wellbutrin however patient still experiencing intermittent muscle pain and weakness. He describes symptoms limited to hip girdle, hamstrings and quadriceps.  He describes  "lactic acid burn" with activity.  On some days, he is asymptomatic and denies muscle fatigue.  He is not sure whether his symptoms started after getting flu vaccine.   Review of Systems Intermittent low back pain,  No abnormal sensation.  He denies dark urine    Past Medical History  Diagnosis Date  . Asthma     History   Social History  . Marital Status: Married    Spouse Name: N/A    Number of Children: N/A  . Years of Education: N/A   Occupational History  . Not on file.   Social History Main Topics  . Smoking status: Never Smoker   . Smokeless tobacco: Never Used  . Alcohol Use: Yes     Comment: socially  . Drug Use: No  . Sexual Activity: Not on file   Other Topics Concern  . Not on file   Social History Narrative  . No narrative on file    No past surgical history on file.  No family history on file.  No Known Allergies  Current Outpatient Prescriptions on File Prior to Visit  Medication Sig Dispense Refill  . albuterol (PROVENTIL HFA;VENTOLIN HFA) 108 (90 BASE) MCG/ACT inhaler Inhale 2 puffs into the lungs every 6 (six) hours as needed for wheezing.  1 Inhaler  5  . beclomethasone (QVAR) 80 MCG/ACT inhaler Inhale 2 puffs  into the lungs 2 (two) times daily.  1 Inhaler  5  . clonazePAM (KLONOPIN) 1 MG tablet Take 1 tablet (1 mg total) by mouth at bedtime as needed.  30 tablet  2   No current facility-administered medications on file prior to visit.    BP 132/98  Pulse 80  Temp(Src) 97.8 F (36.6 C) (Oral)  Ht 6\' 1"  (1.854 m)  Wt 207 lb (93.895 kg)  BMI 27.32 kg/m2    Objective:   Physical Exam  Constitutional: He is oriented to person, place, and time. He appears well-developed and well-nourished. No distress.  HENT:  Head: Normocephalic and atraumatic.  Mouth/Throat: Oropharynx is clear and moist.  Neck: Neck supple.  Cardiovascular: Normal rate, regular rhythm and normal heart sounds.   No murmur heard. Pulmonary/Chest: Effort normal and breath sounds normal. He has no wheezes.  Abdominal: Soft. Bowel sounds are normal. He exhibits no mass. There is no tenderness.  Musculoskeletal: He exhibits no edema and no tenderness.  Lymphadenopathy:    He has no cervical adenopathy.  Neurological: He is alert and oriented to person, place, and time. He has normal reflexes. He displays normal reflexes. No cranial nerve deficit. He exhibits normal muscle tone. Coordination normal.  Normal upper extremity strength 5/5 bilaterally.  Mild fatigability with repeated squats  Skin: Skin is warm and dry.  Psychiatric: He has a normal mood and affect. His behavior is normal.  Assessment & Plan:

## 2013-07-04 LAB — ALDOLASE: Aldolase: 4.6 U/L (ref <=8.1)

## 2013-07-17 ENCOUNTER — Other Ambulatory Visit: Payer: Self-pay | Admitting: Internal Medicine

## 2013-08-11 ENCOUNTER — Ambulatory Visit: Payer: BC Managed Care – PPO | Admitting: Neurology

## 2013-08-26 ENCOUNTER — Ambulatory Visit: Payer: BC Managed Care – PPO | Admitting: Neurology

## 2013-09-22 ENCOUNTER — Ambulatory Visit: Payer: BC Managed Care – PPO | Admitting: Neurology

## 2013-10-29 ENCOUNTER — Ambulatory Visit (INDEPENDENT_AMBULATORY_CARE_PROVIDER_SITE_OTHER): Payer: BC Managed Care – PPO | Admitting: Physician Assistant

## 2013-10-29 ENCOUNTER — Encounter: Payer: Self-pay | Admitting: Physician Assistant

## 2013-10-29 VITALS — BP 142/90 | HR 69 | Temp 98.2°F | Resp 18 | Ht 73.0 in | Wt 202.2 lb

## 2013-10-29 DIAGNOSIS — J45909 Unspecified asthma, uncomplicated: Secondary | ICD-10-CM

## 2013-10-29 DIAGNOSIS — L259 Unspecified contact dermatitis, unspecified cause: Secondary | ICD-10-CM

## 2013-10-29 MED ORDER — PREDNISONE 20 MG PO TABS: ORAL_TABLET | ORAL | Status: DC

## 2013-10-29 NOTE — Progress Notes (Signed)
Pre visit review using our clinic review tool, if applicable. No additional management support is needed unless otherwise documented below in the visit note/SLS  

## 2013-10-29 NOTE — Patient Instructions (Signed)
Please take prednisone as directed until all tablets are gone.  Apply cool compresses and Sarna lotion to help with itch.  Avoid scratching.   For asthma -- continue rescue inhaler as needed.  Start Qvar 80 mcg -- take 1 puff twice daily.  Follow-up with Dr. Shawna Orleans for asthma management.

## 2013-10-30 NOTE — Progress Notes (Signed)
Patient presents to clinic today c/o itchy rash of bilateral upper and lower extremities first noticed 4-5 days ago after he spent time in the woods.  Denies drainage from area.  Has been applying topical Calamine lotion with some relief of itch.   Patient also requesting an additional medication for his asthma.  Endorses having an albuterol inhaler but no other long-term asthma medications.  Endorses nighttime symptoms weekly with increased use of albuterol over the past couple of months.  Past Medical History  Diagnosis Date  . Asthma     Current Outpatient Prescriptions on File Prior to Visit  Medication Sig Dispense Refill  . albuterol (PROVENTIL HFA;VENTOLIN HFA) 108 (90 BASE) MCG/ACT inhaler Inhale 2 puffs into the lungs every 6 (six) hours as needed for wheezing.  1 Inhaler  5  . clonazePAM (KLONOPIN) 1 MG tablet TAKE 1 TABLET BY MOUTH AT BEDTIME AS NEEDED  30 tablet  2  . omeprazole (PRILOSEC OTC) 20 MG tablet Take 20 mg by mouth daily.       No current facility-administered medications on file prior to visit.   No Known Allergies  No family history on file.  History   Social History  . Marital Status: Married    Spouse Name: N/A    Number of Children: N/A  . Years of Education: N/A   Social History Main Topics  . Smoking status: Never Smoker   . Smokeless tobacco: Never Used  . Alcohol Use: Yes     Comment: socially  . Drug Use: No  . Sexual Activity: None   Other Topics Concern  . None   Social History Narrative  . None   Review of Systems - See HPI.  All other ROS are negative.  BP 142/90  Pulse 69  Temp(Src) 98.2 F (36.8 C) (Oral)  Resp 18  Ht 6\' 1"  (1.854 m)  Wt 202 lb 4 oz (91.74 kg)  BMI 26.69 kg/m2  SpO2 98%  Physical Exam  Vitals reviewed. Constitutional: He is oriented to person, place, and time and well-developed, well-nourished, and in no distress.  HENT:  Head: Normocephalic and atraumatic.  Right Ear: External ear normal.  Left Ear:  External ear normal.  Nose: Nose normal.  Mouth/Throat: Oropharynx is clear and moist. No oropharyngeal exudate.  Eyes: Conjunctivae are normal.  Neck: Neck supple.  Cardiovascular: Normal rate, regular rhythm, normal heart sounds and intact distal pulses.   Pulmonary/Chest: Effort normal and breath sounds normal. No respiratory distress. He has no wheezes. He has no rales. He exhibits no tenderness.  Lymphadenopathy:    He has no cervical adenopathy.  Neurological: He is alert and oriented to person, place, and time.  Skin: Skin is warm and dry.  Psychiatric: Affect normal.   Assessment/Plan: Asthma, moderate Continue Albuterol.  Rx Qvar 80 mcg.  Follow-up with PCP.  Contact dermatitis Rx Prednisone taper.  Cool compresses.  Sarna lotion.  Antihistamine for itch.  Call or RTC if symptoms are not improving.

## 2013-10-31 DIAGNOSIS — L259 Unspecified contact dermatitis, unspecified cause: Secondary | ICD-10-CM | POA: Insufficient documentation

## 2013-10-31 MED ORDER — BECLOMETHASONE DIPROPIONATE 80 MCG/ACT IN AERS: 1.0000 | INHALATION_SPRAY | Freq: Two times a day (BID) | RESPIRATORY_TRACT | Status: DC

## 2013-10-31 NOTE — Assessment & Plan Note (Signed)
Rx Prednisone taper.  Cool compresses.  Sarna lotion.  Antihistamine for itch.  Call or RTC if symptoms are not improving.

## 2013-10-31 NOTE — Assessment & Plan Note (Signed)
Continue Albuterol.  Rx Qvar 80 mcg.  Follow-up with PCP.

## 2013-12-10 ENCOUNTER — Other Ambulatory Visit: Payer: Self-pay | Admitting: Internal Medicine

## 2013-12-17 ENCOUNTER — Encounter: Payer: Self-pay | Admitting: Physician Assistant

## 2013-12-17 ENCOUNTER — Ambulatory Visit (INDEPENDENT_AMBULATORY_CARE_PROVIDER_SITE_OTHER): Payer: BC Managed Care – PPO | Admitting: Physician Assistant

## 2013-12-17 VITALS — BP 140/96 | HR 73 | Temp 98.0°F | Resp 16 | Ht 73.0 in | Wt 206.2 lb

## 2013-12-17 DIAGNOSIS — M545 Low back pain, unspecified: Secondary | ICD-10-CM

## 2013-12-17 DIAGNOSIS — M5440 Lumbago with sciatica, unspecified side: Principal | ICD-10-CM

## 2013-12-17 DIAGNOSIS — M5430 Sciatica, unspecified side: Secondary | ICD-10-CM

## 2013-12-17 MED ORDER — HYDROCODONE-ACETAMINOPHEN 7.5-325 MG PO TABS: 1.0000 | ORAL_TABLET | Freq: Four times a day (QID) | ORAL | Status: DC | PRN

## 2013-12-17 MED ORDER — METHYLPREDNISOLONE (PAK) 4 MG PO TABS: ORAL_TABLET | ORAL | Status: DC

## 2013-12-17 NOTE — Progress Notes (Signed)
Pre visit review using our clinic review tool, if applicable. No additional management support is needed unless otherwise documented below in the visit note/SLS  

## 2013-12-17 NOTE — Patient Instructions (Signed)
Please take medications as directed.  [place] for severe pain.  As pain improves, substitute Ibuprofen or Tylenol for pain.  Avoid heavy lifting or overexertion.  Apply topical [place] to lower back.  Call or return to clinic if symptoms are not improving.

## 2013-12-18 DIAGNOSIS — M5440 Lumbago with sciatica, unspecified side: Secondary | ICD-10-CM

## 2013-12-18 DIAGNOSIS — M545 Low back pain, unspecified: Secondary | ICD-10-CM | POA: Insufficient documentation

## 2013-12-18 NOTE — Assessment & Plan Note (Signed)
Rx Medrol dose pack.  Rx Norco for moderate to severe pain.  Can resume Ibuprofen or Tylenol once pain begins to improve.  Avoid heavy lifting or overexertion.  Topical Aspercreme to area. Call or return if no improvement within 48 hours of initiating therapy.

## 2013-12-18 NOTE — Progress Notes (Signed)
Patient presents to clinic today c/o LBP x 3 days after bending over to pick something up.  Patient endorses back pain that radiated into LE bilaterally.  Patient denies numbness, tingling or weakness.  Denies saddle anesthesia.  Denies change to bowel or bladder habits.  Patient states symptoms improved initially, but have worsened again.  Patient has not taken anything for pain.  Past Medical History  Diagnosis Date  . Asthma     Current Outpatient Prescriptions on File Prior to Visit  Medication Sig Dispense Refill  . albuterol (PROVENTIL HFA;VENTOLIN HFA) 108 (90 BASE) MCG/ACT inhaler Inhale 2 puffs into the lungs every 6 (six) hours as needed for wheezing.  1 Inhaler  5  . beclomethasone (QVAR) 80 MCG/ACT inhaler Inhale 1 puff into the lungs 2 (two) times daily.  1 Inhaler  3  . clonazePAM (KLONOPIN) 1 MG tablet TAKE 1 TABLET BY MOUTH EVERY NIGHT AT BEDTIME AS NEEDED  30 tablet  2  . omeprazole (PRILOSEC OTC) 20 MG tablet Take 20 mg by mouth daily.       No current facility-administered medications on file prior to visit.    No Known Allergies  No family history on file.  History   Social History  . Marital Status: Married    Spouse Name: N/A    Number of Children: N/A  . Years of Education: N/A   Social History Main Topics  . Smoking status: Never Smoker   . Smokeless tobacco: Never Used  . Alcohol Use: Yes     Comment: socially  . Drug Use: No  . Sexual Activity: None   Other Topics Concern  . None   Social History Narrative  . None   Review of Systems - See HPI.  All other ROS are negative.  BP 140/96  Pulse 73  Temp(Src) 98 F (36.7 C) (Oral)  Resp 16  Ht 6\' 1"  (1.854 m)  Wt 206 lb 4 oz (93.554 kg)  BMI 27.22 kg/m2  SpO2 98%  Physical Exam  Vitals reviewed. Constitutional: He is oriented to person, place, and time and well-developed, well-nourished, and in no distress.  HENT:  Head: Normocephalic and atraumatic.  Eyes: Conjunctivae are normal.   Cardiovascular: Normal rate, regular rhythm, normal heart sounds and intact distal pulses.   Pulmonary/Chest: Effort normal and breath sounds normal. No respiratory distress. He has no wheezes. He has no rales. He exhibits no tenderness.  Musculoskeletal:       Cervical back: Normal.       Thoracic back: Normal.       Lumbar back: He exhibits tenderness, pain and spasm. He exhibits no bony tenderness.       Right upper leg: He exhibits tenderness.       Left upper leg: He exhibits tenderness.  Neurological: He is alert and oriented to person, place, and time. He has normal reflexes.  Skin: Skin is warm and dry. No rash noted.  Psychiatric: Affect normal.   Assessment/Plan: Low back pain with radiation Rx Medrol dose pack.  Rx Norco for moderate to severe pain.  Can resume Ibuprofen or Tylenol once pain begins to improve.  Avoid heavy lifting or overexertion.  Topical Aspercreme to area. Call or return if no improvement within 48 hours of initiating therapy.

## 2013-12-22 ENCOUNTER — Ambulatory Visit (INDEPENDENT_AMBULATORY_CARE_PROVIDER_SITE_OTHER): Payer: BC Managed Care – PPO | Admitting: Physician Assistant

## 2013-12-22 ENCOUNTER — Encounter: Payer: Self-pay | Admitting: Physician Assistant

## 2013-12-22 VITALS — BP 137/84 | HR 82 | Temp 98.3°F | Resp 16 | Ht 73.0 in | Wt 204.5 lb

## 2013-12-22 DIAGNOSIS — N410 Acute prostatitis: Secondary | ICD-10-CM

## 2013-12-22 MED ORDER — SULFAMETHOXAZOLE-TMP DS 800-160 MG PO TABS: 1.0000 | ORAL_TABLET | Freq: Two times a day (BID) | ORAL | Status: DC

## 2013-12-22 NOTE — Progress Notes (Signed)
Pre visit review using our clinic review tool, if applicable. No additional management support is needed unless otherwise documented below in the visit note/SLS  

## 2013-12-22 NOTE — Patient Instructions (Addendum)
Please take antibiotic as directed. I will call you with your results.  Stay well hydrated. Let me know if symptoms do not begin to improve.  I am glad that your back and leg symptoms have improved.  Still avoid heavy lifting and overexertion.   Prostatitis The prostate gland is about the size and shape of a walnut. It is located just below your bladder. It produces one of the components of semen, which is made up of sperm and the fluids that help nourish and transport it out from the testicles. Prostatitis is inflammation of the prostate gland.  There are four types of prostatitis:  Acute bacterial prostatitis. This is the least common type of prostatitis. It starts quickly and usually is associated with a bladder infection, high fever, and shaking chills. It can occur at any age.  Chronic bacterial prostatitis. This is a persistent bacterial infection in the prostate. It usually develops from repeated acute bacterial prostatitis or acute bacterial prostatitis that was not properly treated. It can occur in men of any age but is most common in middle-aged men whose prostate has begun to enlarge. The symptoms are not as severe as those in acute bacterial prostatitis. Discomfort in the part of your body that is in front of your rectum and below your scrotum (perineum), lower abdomen, or in the head of your penis (glans) may represent your primary discomfort.  Chronic prostatitis (nonbacterial). This is the most common type of prostatitis. It is inflammation of the prostate gland that is not caused by a bacterial infection. The cause is unknown and may be associated with a viral infection or autoimmune disorder.  Prostatodynia (pelvic floor disorder). This is associated with increased muscular tone in the pelvis surrounding the prostate. CAUSES The causes of bacterial prostatitis are bacterial infection. The causes of the other types of prostatitis are unknown.  SYMPTOMS  Symptoms can vary depending  upon the type of prostatitis that exists. There can also be overlap in symptoms. Possible symptoms for each type of prostatitis are listed below. Acute Bacterial Prostatitis  Painful urination.  Fever or chills.  Muscle or joint pains.  Low back pain.  Low abdominal pain.  Inability to empty bladder completely. Chronic Bacterial Prostatitis, Chronic Nonbacterial Prostatitis, and Prostatodynia  Sudden urge to urinate.  Frequent urination.  Difficulty starting urine stream.  Weak urine stream.  Discharge from the urethra.  Dribbling after urination.  Rectal pain.  Pain in the testicles, penis, or tip of the penis.  Pain in the perineum.  Problems with sexual function.  Painful ejaculation.  Bloody semen. DIAGNOSIS  In order to diagnose prostatitis, your health care provider will ask about your symptoms. One or more urine samples will be taken and tested (urinalysis). If the urinalysis result is negative for bacteria, your health care provider may use a finger to feel your prostate (digital rectal exam). This exam helps your health care provider determine if your prostate is swollen and tender. It will also produce a specimen of semen that can be analyzed. TREATMENT  Treatment for prostatitis depends on the cause. If a bacterial infection is the cause, it can be treated with antibiotic medicine. In cases of chronic bacterial prostatitis, the use of antibiotics for up to 1 month or 6 weeks may be necessary. Your health care provider may instruct you to take sitz baths to help relieve pain. A sitz bath is a bath of hot water in which your hips and buttocks are under water. This relaxes the  pelvic floor muscles and often helps to relieve the pressure on your prostate. HOME CARE INSTRUCTIONS   Take all medicines as directed by your health care provider.  Take sitz baths as directed by your health care provider. SEEK MEDICAL CARE IF:   Your symptoms get worse, not  better.  You have a fever. SEEK IMMEDIATE MEDICAL CARE IF:   You have chills.  You feel nauseous or vomit.  You feel lightheaded or faint.  You are unable to urinate.  You have blood or blood clots in your urine. MAKE SURE YOU:  Understand these instructions.  Will watch your condition.  Will get help right away if you are not doing well or get worse. Document Released: 05/19/2000 Document Revised: 05/27/2013 Document Reviewed: 12/09/2012 [facility] Patient Information 2015 [place], [place]. This information is not intended to replace advice given to you by your health care provider. Make sure you discuss any questions you have with your health care provider.

## 2013-12-23 DIAGNOSIS — N410 Acute prostatitis: Secondary | ICD-10-CM | POA: Insufficient documentation

## 2013-12-23 LAB — URINALYSIS, ROUTINE W REFLEX MICROSCOPIC
BILIRUBIN URINE: NEGATIVE
Hgb urine dipstick: NEGATIVE
KETONES UR: NEGATIVE
LEUKOCYTES UA: NEGATIVE
Nitrite: NEGATIVE
PH: 6.5 (ref 5.0–8.0)
RBC / HPF: NONE SEEN (ref 0–?)
Specific Gravity, Urine: <=1.005 — AB (ref 1.000–1.030)
TOTAL PROTEIN, URINE-UPE24: NEGATIVE
URINE GLUCOSE: NEGATIVE
Urobilinogen, UA: 0.2 (ref 0.0–1.0)
WBC UA: NONE SEEN (ref 0–?)

## 2013-12-23 NOTE — Assessment & Plan Note (Signed)
Concern for acute flare.  Will obtain UA and culture.  Patient does not tolerate Fluoroquinolones.  Will Rx Bactrim.  Begin probiotic.  Will call with results.  OTC pain medications as needed.

## 2013-12-23 NOTE — Progress Notes (Signed)
Patient presents to clinic today c/o hip and groin pain and tenderness that is throbbing in nature.  Patient also noting mild rectal discomfort with prolonged sitting.  Patient has history of chronic prostatitis with rare flare-ups.  States this feels like previous flares.  Endorses feeling feverish and fatigued.  Denies dysuria, urgency, frequency, hematuria or change to erectile function.  Past Medical History  Diagnosis Date  . Asthma     Current Outpatient Prescriptions on File Prior to Visit  Medication Sig Dispense Refill  . albuterol (PROVENTIL HFA;VENTOLIN HFA) 108 (90 BASE) MCG/ACT inhaler Inhale 2 puffs into the lungs every 6 (six) hours as needed for wheezing.  1 Inhaler  5  . beclomethasone (QVAR) 80 MCG/ACT inhaler Inhale 1 puff into the lungs 2 (two) times daily.  1 Inhaler  3  . omeprazole (PRILOSEC OTC) 20 MG tablet Take 20 mg by mouth daily.       No current facility-administered medications on file prior to visit.    Allergies  Allergen Reactions  . Hydrocodone Itching and Other (See Comments)    Hypersensitivity    No family history on file.  History   Social History  . Marital Status: Married    Spouse Name: N/A    Number of Children: N/A  . Years of Education: N/A   Social History Main Topics  . Smoking status: Never Smoker   . Smokeless tobacco: Never Used  . Alcohol Use: Yes     Comment: socially  . Drug Use: No  . Sexual Activity: None   Other Topics Concern  . None   Social History Narrative  . None    Review of Systems - See HPI.  All other ROS are negative.  BP 137/84  Pulse 82  Temp(Src) 98.3 F (36.8 C) (Oral)  Resp 16  Ht 6\' 1"  (1.854 m)  Wt 204 lb 8 oz (92.761 kg)  BMI 26.99 kg/m2  SpO2 98%  Physical Exam  Vitals reviewed. Constitutional: He is well-developed, well-nourished, and in no distress.  HENT:  Head: Normocephalic and atraumatic.  Cardiovascular: Normal rate, regular rhythm, normal heart sounds and intact distal  pulses.   Pulmonary/Chest: Effort normal and breath sounds normal.  Abdominal: Soft. Bowel sounds are normal. He exhibits no distension and no mass. There is no tenderness. There is no rebound and no guarding.  Genitourinary:  Patient defers rectal exam.  GU within normal limits.  Skin: Skin is warm and dry. No rash noted.    Recent Results (from the past 2160 hour(s))  URINALYSIS, ROUTINE W REFLEX MICROSCOPIC     Status: Abnormal   Collection Time    12/22/13  3:09 PM      Result Value Ref Range   Color, Urine YELLOW  Yellow;Lt. Yellow   APPearance CLEAR  Clear   Specific Gravity, Urine <=1.005 (*) 1.000 - 1.030   pH 6.5  5.0 - 8.0   Total Protein, Urine NEGATIVE  Negative   Urine Glucose NEGATIVE  Negative   Ketones, ur NEGATIVE  Negative   Bilirubin Urine NEGATIVE  Negative   Hgb urine dipstick NEGATIVE  Negative   Urobilinogen, UA 0.2  0.0 - 1.0   Leukocytes, UA NEGATIVE  Negative   Nitrite NEGATIVE  Negative   WBC, UA none seen  0-2/hpf   RBC / HPF none seen  0-2/hpf    Assessment/Plan: Prostatitis, acute Concern for acute flare.  Will obtain UA and culture.  Patient does not tolerate Fluoroquinolones.  Will  Rx Bactrim.  Begin probiotic.  Will call with results.  OTC pain medications as needed.

## 2013-12-24 LAB — CULTURE, URINE COMPREHENSIVE
Colony Count: NO GROWTH
Organism ID, Bacteria: NO GROWTH

## 2014-01-08 ENCOUNTER — Ambulatory Visit (INDEPENDENT_AMBULATORY_CARE_PROVIDER_SITE_OTHER): Payer: BC Managed Care – PPO | Admitting: Internal Medicine

## 2014-01-08 ENCOUNTER — Encounter: Payer: Self-pay | Admitting: Internal Medicine

## 2014-01-08 VITALS — BP 124/90 | Temp 98.1°F | Ht 73.0 in | Wt 202.0 lb

## 2014-01-08 DIAGNOSIS — F529 Unspecified sexual dysfunction not due to a substance or known physiological condition: Secondary | ICD-10-CM

## 2014-01-08 DIAGNOSIS — M791 Myalgia, unspecified site: Secondary | ICD-10-CM

## 2014-01-08 DIAGNOSIS — IMO0001 Reserved for inherently not codable concepts without codable children: Secondary | ICD-10-CM

## 2014-01-08 DIAGNOSIS — R37 Sexual dysfunction, unspecified: Secondary | ICD-10-CM

## 2014-01-08 DIAGNOSIS — M6281 Muscle weakness (generalized): Secondary | ICD-10-CM

## 2014-01-08 DIAGNOSIS — F411 Generalized anxiety disorder: Secondary | ICD-10-CM

## 2014-01-08 MED ORDER — FLUOXETINE HCL 10 MG PO TABS: 10.0000 mg | ORAL_TABLET | Freq: Every day | ORAL | Status: DC

## 2014-01-08 NOTE — Assessment & Plan Note (Signed)
Start fluoxetine 10 mg at bedtime.  Also use higher dose of clonazepam 1mg  to treat insomnia.  He can not tolerate ambien due to hallucinations.

## 2014-01-08 NOTE — Progress Notes (Signed)
   Subjective:    Patient ID: Kyle Miller, male    DOB: 1976-04-17, 38 y.o.   MRN: [id number]  HPI  38 year old white male previously seen for possible prostatitis and unexplained muscle fatigue for followup. Interval medical history over the last several weeks he was seen by urgent care physician and again treated for presumed prostatitis.  Patient unsure whether his muscle symptoms related to possible chronic prostate infection. He reports experiencing muscle tightness isolated to his hip girdle and lower extremities on 12/07/2013. He later experienced low back pain. He was seen by urgent care physician and treated with Medrol Dosepak. He reports within 2 days all of his muscle symptoms improved.  He is currently experiencing recurrence of symptoms but his muscle symptoms are less prominent.   Patient also complains of lack of sexual drive. He is also very concerned that his genitals seem to be "shrinking".  This is causing significant anxiety.   Review of Systems Mild erectile dysfunction, chronic anxiety and insomnia    Past Medical History  Diagnosis Date  . Asthma     History   Social History  . Marital Status: Married    Spouse Name: N/A    Number of Children: N/A  . Years of Education: N/A   Occupational History  . Not on file.   Social History Main Topics  . Smoking status: Never Smoker   . Smokeless tobacco: Never Used  . Alcohol Use: Yes     Comment: socially  . Drug Use: No  . Sexual Activity: Not on file   Other Topics Concern  . Not on file   Social History Narrative  . No narrative on file    No past surgical history on file.  No family history on file.  Allergies  Allergen Reactions  . Hydrocodone Itching and Other (See Comments)    Hypersensitivity    Current Outpatient Prescriptions on File Prior to Visit  Medication Sig Dispense Refill  . albuterol (PROVENTIL HFA;VENTOLIN HFA) 108 (90 BASE) MCG/ACT inhaler Inhale 2 puffs into the  lungs every 6 (six) hours as needed for wheezing.  1 Inhaler  5  . beclomethasone (QVAR) 80 MCG/ACT inhaler Inhale 1 puff into the lungs 2 (two) times daily.  1 Inhaler  3  . omeprazole (PRILOSEC OTC) 20 MG tablet Take 20 mg by mouth daily.       No current facility-administered medications on file prior to visit.    BP 124/90  Temp(Src) 98.1 F (36.7 C) (Oral)  Ht 6\' 1"  (1.854 m)  Wt 202 lb (91.627 kg)  BMI 26.66 kg/m2    Objective:   Physical Exam  Constitutional: He is oriented to person, place, and time. He appears well-developed and well-nourished. No distress.  HENT:  Head: Normocephalic and atraumatic.  Cardiovascular: Normal rate, regular rhythm and normal heart sounds.   No murmur heard. Pulmonary/Chest: Effort normal and breath sounds normal. He has no wheezes.  Musculoskeletal: He exhibits no edema.  Neurological: He is alert and oriented to person, place, and time.  Skin: Skin is warm and dry.  Psychiatric:  anxious          Assessment & Plan:

## 2014-01-08 NOTE — Assessment & Plan Note (Addendum)
Rule out hypogonadism.  Obtain screening iron studies.

## 2014-01-08 NOTE — Assessment & Plan Note (Signed)
Patient still experiencing unexplained muscle fatigue.  He describes burning sensation with activity.  Check muscle enzymes and sed rate.  Rearrange referral to neurology.

## 2014-01-08 NOTE — Progress Notes (Signed)
Pre visit review using our clinic review tool, if applicable. No additional management support is needed unless otherwise documented below in the visit note. 

## 2014-01-09 LAB — BASIC METABOLIC PANEL
BUN: 11 mg/dL (ref 6–23)
CALCIUM: 9.7 mg/dL (ref 8.4–10.5)
CO2: 27 meq/L (ref 19–32)
CREATININE: 1.2 mg/dL (ref 0.4–1.5)
Chloride: 99 mEq/L (ref 96–112)
GFR: 72.81 mL/min (ref >60.00)
GLUCOSE: 86 mg/dL (ref 70–99)
Potassium: 4.2 mEq/L (ref 3.5–5.1)
Sodium: 136 mEq/L (ref 135–145)

## 2014-01-09 LAB — HEPATIC FUNCTION PANEL
ALBUMIN: 4.7 g/dL (ref 3.5–5.2)
ALT: 21 U/L (ref 0–53)
AST: 21 U/L (ref 0–37)
Alkaline Phosphatase: 66 U/L (ref 39–117)
Bilirubin, Direct: 0 mg/dL (ref 0.0–0.3)
Total Bilirubin: 1 mg/dL (ref 0.2–1.2)
Total Protein: 7.8 g/dL (ref 6.0–8.3)

## 2014-01-09 LAB — FERRITIN: Ferritin: 115.8 ng/mL (ref 22.0–322.0)

## 2014-01-09 LAB — CBC WITH DIFFERENTIAL/PLATELET
BASOS ABS: 0 10*3/uL (ref 0.0–0.1)
Basophils Relative: 0.5 % (ref 0.0–3.0)
Eosinophils Absolute: 0.1 10*3/uL (ref 0.0–0.7)
Eosinophils Relative: 2 % (ref 0.0–5.0)
HCT: 47.5 % (ref 39.0–52.0)
HEMOGLOBIN: 15.6 g/dL (ref 13.0–17.0)
Lymphocytes Relative: 29 % (ref 12.0–46.0)
Lymphs Abs: 2 10*3/uL (ref 0.7–4.0)
MCHC: 32.7 g/dL (ref 30.0–36.0)
MCV: 90.8 fl (ref 78.0–100.0)
Monocytes Absolute: 0.5 10*3/uL (ref 0.1–1.0)
Monocytes Relative: 6.6 % (ref 3.0–12.0)
NEUTROS ABS: 4.3 10*3/uL (ref 1.4–7.7)
NEUTROS PCT: 61.9 % (ref 43.0–77.0)
PLATELETS: 270 10*3/uL (ref 150.0–400.0)
RBC: 5.23 Mil/uL (ref 4.22–5.81)
RDW: 13.4 % (ref 11.5–15.5)
WBC: 7 10*3/uL (ref 4.0–10.5)

## 2014-01-09 LAB — IBC PANEL
IRON: 159 ug/dL (ref 42–165)
Saturation Ratios: 36.3 % (ref 20.0–50.0)
Transferrin: 312.7 mg/dL (ref 212.0–360.0)

## 2014-01-09 LAB — CK: Total CK: 76 U/L (ref 7–232)

## 2014-01-09 LAB — TESTOSTERONE, FREE, TOTAL, SHBG
Sex Hormone Binding: 35 nmol/L (ref 13–71)
TESTOSTERONE-% FREE: 2 % (ref 1.6–2.9)
TESTOSTERONE: 474 ng/dL (ref 300–890)
Testosterone, Free: 95.2 pg/mL (ref 47.0–244.0)

## 2014-01-09 LAB — SEDIMENTATION RATE: SED RATE: 3 mm/h (ref 0–22)

## 2014-01-09 LAB — T4, FREE: Free T4: 1.05 ng/dL (ref 0.60–1.60)

## 2014-01-09 LAB — TSH: TSH: 0.36 u[IU]/mL (ref 0.35–4.50)

## 2014-01-10 LAB — ALDOLASE: Aldolase: 5.8 U/L (ref <=8.1)

## 2014-02-06 ENCOUNTER — Ambulatory Visit (INDEPENDENT_AMBULATORY_CARE_PROVIDER_SITE_OTHER): Payer: BC Managed Care – PPO | Admitting: Neurology

## 2014-02-06 ENCOUNTER — Encounter: Payer: Self-pay | Admitting: Neurology

## 2014-02-06 VITALS — BP 130/90 | HR 72 | Ht 73.0 in | Wt 201.1 lb

## 2014-02-06 DIAGNOSIS — M6289 Other specified disorders of muscle: Secondary | ICD-10-CM

## 2014-02-06 DIAGNOSIS — F411 Generalized anxiety disorder: Secondary | ICD-10-CM

## 2014-02-06 DIAGNOSIS — M629 Disorder of muscle, unspecified: Secondary | ICD-10-CM

## 2014-02-06 NOTE — Patient Instructions (Signed)
Discuss with your primary care provider whether you would like to start therapy for coping mechanisms to minimize stress/anxiety Please contact my office if your symptoms worsen or do not improve.

## 2014-02-06 NOTE — Progress Notes (Signed)
[facility] Clinic Note - Initial Visit   Date: 02/06/2014  Mali N Sasso MRN: [id number] DOB: 01-28-1976   Dear Dr. Shawna Orleans:  Thank you for your kind referral of Mali N Ghazarian for consultation of myalgias. Although his history is well known to you, please allow [place] to reiterate it for the purpose of our medical record. The patient was accompanied to the clinic by self.    History of Present Illness: Mali N Fessenden is a 38 y.o. right-handed Caucasian male with history of asthma, depression, irritable bowel, and GERD presenting for evaluation of muscle tightness.    Starting in October 2014, he started feeling muscle tightness especially over the quadriceps.  Spells are always triggered by increased stress and described as if his muscles are "jello and worked out too much".  He denies any muscle cramps, twitches, dark-colored urine, or pain.    Over the summer, he was picking up a stack of paper off the floor and the following day, he developed severe back pain with shooting pain into his legs.  He went to see his PCP who gave him prednisone taper which alleviated the pain.  Denies any numbness or tingling.   Currently, he feels that it is 90% back to baseline, but symptoms are always fluctuating.  He was recently started on prozac and reports having improvement of stress because of it.  He reports being a very worried about everything and feels symptoms may be related to anxiety.    He has still been able to continue with his usual ADLs without any difficulty.  No falls.  Of note, he was treated with ciprofloxacin for prostatitis ~ 2012 and has severe whole body pain with that.  Out-side paper records, electronic medical record, and images have been reviewed where available and summarized as:  Labs 01/08/2014:  Ferritin 115, Na 136, K 4.2, Ca 9.7, AST 21, ALT 21, TSH 0.36, fT4 1.05, ESR 3, CK 76, aldolase 5.8,   Past Medical History  Diagnosis Date  . Asthma   .  Depression   . GERD (gastroesophageal reflux disease)     Past Surgical History  Procedure Laterality Date  . None       Medications:  Current Outpatient Prescriptions on File Prior to Visit  Medication Sig Dispense Refill  . albuterol (PROVENTIL HFA;VENTOLIN HFA) 108 (90 BASE) MCG/ACT inhaler Inhale 2 puffs into the lungs every 6 (six) hours as needed for wheezing.  1 Inhaler  5  . beclomethasone (QVAR) 80 MCG/ACT inhaler Inhale 1 puff into the lungs 2 (two) times daily.  1 Inhaler  3  . clonazePAM (KLONOPIN) 1 MG tablet Take 1 tablet by mouth daily.      Marland Kitchen FLUoxetine (PROZAC) 10 MG tablet Take 1 tablet (10 mg total) by mouth at bedtime.  30 tablet  3  . omeprazole (PRILOSEC OTC) 20 MG tablet Take 20 mg by mouth daily.       No current facility-administered medications on file prior to visit.    Allergies:  Allergies  Allergen Reactions  . Hydrocodone Itching and Other (See Comments)    Hypersensitivity    Family History: Family History  Problem Relation Age of Onset  . Healthy Mother   . Healthy Father   . Thyroid disease Brother   . Asthma Daughter     Social History: History   Social History  . Marital Status: Married    Spouse Name: N/A    Number of Children: N/A  .  Years of Education: N/A   Occupational History  . Not on file.   Social History Main Topics  . Smoking status: Never Smoker   . Smokeless tobacco: Never Used  . Alcohol Use: No  . Drug Use: No  . Sexual Activity: Not on file   Other Topics Concern  . Not on file   Social History Narrative   He live with wife and youngest daughter.   He works as a Civil Service fast streamer.   Highest level of education:  College, BS    Review of Systems:  CONSTITUTIONAL: No fevers, chills, night sweats, or weight loss.  EYES: No visual changes or eye pain ENT: No hearing changes.  No history of nose bleeds.   RESPIRATORY: No cough, wheezing and shortness of breath.   CARDIOVASCULAR: Negative for chest pain, and  palpitations.   GI: Negative for abdominal discomfort, blood in stools or black stools.  +recent change in bowel habits.   GU:  No history of incontinence.   MUSCLOSKELETAL: No history of joint pain or swelling.  No myalgias.   SKIN: Negative for lesions, rash, and itching.   HEMATOLOGY/ONCOLOGY: Negative for prolonged bleeding, bruising easily, and swollen nodes.  No history of cancer.   ENDOCRINE: Negative for cold or heat intolerance, polydipsia or goiter.   PSYCH:  +depression ++anxiety symptoms.   NEURO: As Above.   Vital Signs:  BP 130/90  Pulse 72  Ht _0  (1.854 m)  Wt 201 lb 1 oz (91.201 kg)  BMI 26.53 kg/m2  SpO2 98%   General Medical Exam:   General:  Well appearing, comfortable.   Eyes/ENT: see cranial nerve examination.   Neck: No masses appreciated.  Full range of motion without tenderness.  No carotid bruits. Respiratory:  Clear to auscultation, good air entry bilaterally.   Cardiac:  Regular rate and rhythm, no murmur.     Extremities:  No deformities, edema, or skin discoloration. Good capillary refill.   Skin:  Skin color, texture, turgor normal. No rashes or lesions.  Neurological Exam: MENTAL STATUS including orientation to time, place, person, recent and remote memory, attention span and concentration, language, and fund of knowledge is normal.  Speech is not dysarthric.  CRANIAL NERVES: II:  No visual field defects.  Unremarkable fundi.   III-IV-VI: Pupils equal round and reactive to light.  Normal conjugate, extra-ocular eye movements in all directions of gaze.  No nystagmus.  No ptosis.   V:  Normal facial sensation.    VII:  Normal facial symmetry and movements.  No pathologic facial reflexes.  VIII:  Normal hearing and vestibular function.   IX-X:  Normal palatal movement.   XI:  Normal shoulder shrug and head rotation.   XII:  Normal tongue strength and range of motion, no deviation or fasciculation.  MOTOR:  No atrophy, fasciculations or abnormal  movements.  No pronator drift.  Tone is normal.    Right Upper Extremity:    Left Upper Extremity:    Deltoid  5/5   Deltoid  5/5   Biceps  5/5   Biceps  5/5   Triceps  5/5   Triceps  5/5   Wrist extensors  5/5   Wrist extensors  5/5   Wrist flexors  5/5   Wrist flexors  5/5   Finger extensors  5/5   Finger extensors  5/5   Finger flexors  5/5   Finger flexors  5/5   Dorsal interossei  5/5   Dorsal interossei  5/5  Abductor pollicis  5/5   Abductor pollicis  5/5   Tone (Ashworth scale)  0  Tone (Ashworth scale)  0   Right Lower Extremity:    Left Lower Extremity:    Hip flexors  5/5   Hip flexors  5/5   Hip extensors  5/5   Hip extensors  5/5   Knee flexors  5/5   Knee flexors  5/5   Knee extensors  5/5   Knee extensors  5/5   Dorsiflexors  5/5   Dorsiflexors  5/5   Plantarflexors  5/5   Plantarflexors  5/5   Toe extensors  5/5   Toe extensors  5/5   Toe flexors  5/5   Toe flexors  5/5   Tone (Ashworth scale)  0  Tone (Ashworth scale)  0   MSRs:  Right                                                                 Left brachioradialis 2+  brachioradialis 2+  biceps 2+  biceps 2+  triceps 2+  triceps 2+  patellar 2+  patellar 2+  ankle jerk 2+  ankle jerk 2+  Hoffman no  Hoffman no  plantar response down  plantar response down   SENSORY:  Normal and symmetric perception of light touch, pinprick, vibration, and proprioception.  Romberg's sign absent.   COORDINATION/GAIT: Normal finger-to- nose-finger and heel-to-shin.  Intact rapid alternating movements bilaterally.  Able to rise from a chair without using arms.  Gait narrow based and stable. Tandem and stressed gait intact.    IMPRESSION: Mr. Amores is a 38 year-old gentleman presenting for evaluation of episodic muscle tightness and low back pain.  His neurological examination is entirely normal and non-focal.  Previous laboratory studies including CK, aldolase, ESR, TSH, and CMP is normal. Because symptoms have since  resolved and exam is normal, I do not feel additional testing is necessary at this time.  Going forward, labs for lupus can be checked as well as EMG, but at this juncture, these tests are not indicated as he is asymptomatic. Low suspicion for myopathy given normal CK and muscle strength.    He endorses a significant amount of anxiety which provokes his symptoms and I explained that stress can be manifested differently in individuals, including increased muscle tension.  I agree that there is a component of anxiety which is contributing to his symptoms, especially since he is doing much better after starting prozac.  I recommended that he see a therapist for coping mechanism and stress management strategies, which he was acceptable with but would like to think about it first.   If symptoms worsen, patient will contact my office for further evaluation.   The duration of this appointment visit was 40 minutes of face-to-face time with the patient.  Greater than 50% of this time was spent in counseling, explanation of diagnosis, planning of further management, and coordination of care.   Thank you for allowing me to participate in patient's care.  If I can answer any additional questions, I would be pleased to do so.    Sincerely,    Tonica Brasington K. Posey Pronto, DO

## 2014-04-01 ENCOUNTER — Telehealth: Payer: Self-pay | Admitting: Internal Medicine

## 2014-04-04 ENCOUNTER — Other Ambulatory Visit: Payer: Self-pay | Admitting: Internal Medicine

## 2014-04-06 NOTE — Telephone Encounter (Signed)
2nd re-fill request was received for the below medications

## 2014-04-08 NOTE — Telephone Encounter (Signed)
rx's called into [facility]

## 2014-04-30 ENCOUNTER — Other Ambulatory Visit: Payer: Self-pay | Admitting: Internal Medicine

## 2014-05-08 ENCOUNTER — Other Ambulatory Visit: Payer: Self-pay | Admitting: Internal Medicine

## 2014-06-02 ENCOUNTER — Other Ambulatory Visit: Payer: Self-pay | Admitting: Internal Medicine

## 2014-06-11 ENCOUNTER — Other Ambulatory Visit: Payer: Self-pay | Admitting: Internal Medicine

## 2014-06-23 ENCOUNTER — Ambulatory Visit (INDEPENDENT_AMBULATORY_CARE_PROVIDER_SITE_OTHER): Payer: BC Managed Care – PPO | Admitting: Neurology

## 2014-06-23 ENCOUNTER — Encounter: Payer: Self-pay | Admitting: Neurology

## 2014-06-23 VITALS — BP 130/90 | HR 77 | Ht 73.0 in | Wt 213.1 lb

## 2014-06-23 VITALS — BP 130/90 | HR 77

## 2014-06-23 DIAGNOSIS — M5430 Sciatica, unspecified side: Secondary | ICD-10-CM

## 2014-06-23 DIAGNOSIS — M5440 Lumbago with sciatica, unspecified side: Secondary | ICD-10-CM

## 2014-06-23 DIAGNOSIS — M6289 Other specified disorders of muscle: Secondary | ICD-10-CM

## 2014-06-23 DIAGNOSIS — G729 Myopathy, unspecified: Secondary | ICD-10-CM

## 2014-06-23 MED ORDER — GABAPENTIN 300 MG PO CAPS: 300.0000 mg | ORAL_CAPSULE | Freq: Every day | ORAL | Status: DC

## 2014-06-23 MED ORDER — CYCLOBENZAPRINE HCL 5 MG PO TABS: ORAL_TABLET | ORAL | Status: DC

## 2014-06-23 NOTE — Patient Instructions (Addendum)
1.  Start flexeril 5mg  at bedtime x 1 week, then increase to 1 tablet twice daily. 2.  Start neurontin 300mg  at bedtime. 3.  Start physical therapy for leg stretching and low back pain 4.  Consider MRI lumbar spine, if no improvement 5.  Return to clinic 79-months

## 2014-06-23 NOTE — Progress Notes (Signed)
Follow-up Visit   Date: 06/23/2014  Kyle Miller MRN: [id number] DOB: 01-01-1976   Interim History: Kyle Miller is a 39 y.o. right-handed Caucasian male with asthma, depression, IBS, and GERD returning to the clinic for follow-up of bilateral leg pain.  The patient was accompanied to the clinic by wife who also provides collateral information.    History of present illness: Starting in October 2014, he started feeling muscle tightness especially over the quadriceps. Spells are always triggered by increased stress and described as if his muscles are "jello and worked out too much". He denies any muscle cramps, twitches, dark-colored urine, or pain.   Over the summer, he was picking up a stack of paper off the floor and the following day, he developed severe back pain with shooting pain into his legs. He went to see his PCP who gave him prednisone taper which alleviated the pain. Denies any numbness or tingling.  He feels that it is 90% back to baseline, but symptoms are always fluctuating. He was recently started on prozac and reports having improvement of stress because of it. He reports being a very worried about everything and feels symptoms may be related to anxiety.   Of note, he was treated with ciprofloxacin for prostatitis ~ 2012 and has severe whole body pain with that.  UPDATE 06/23/2014:  He reports having worsening spells of sciatica since his last visit, especially in the morning which is improved as he goes on with his day.  Hot baths help his pain.  Pain is daily and starts in his upper hip and radiates down his thigh.  It is improved by rest, sitting, and laying.  Pain is worse when he stands up.   He has still been able to continue with his usual ADLs without any difficulty and denies any falls.  He does endorse feeling of muscle stiffness. Denies any numbness or tingling.  He works as an Garment/textile technologist and also moves furniture, but denies problems when lifting  objects.   Medications:  Current Outpatient Prescriptions on File Prior to Visit  Medication Sig Dispense Refill  . clonazePAM (KLONOPIN) 1 MG tablet TAKE 1 TABLET BY MOUTH AT BEDTIME AS NEEDED 30 tablet 0  . FLUoxetine (PROZAC) 10 MG tablet TAKE 1 TABLET (10 MG TOTAL) BY MOUTH AT BEDTIME. 30 tablet 3  . omeprazole (PRILOSEC OTC) 20 MG tablet Take 20 mg by mouth daily.    Marland Kitchen QVAR 80 MCG/ACT inhaler INHALE 2 PUFFS INTO THE LUNGS 2 (TWO) TIMES DAILY. 8.7 g 1  . VENTOLIN HFA 108 (90 BASE) MCG/ACT inhaler INHALE 2 PUFFS INTO THE LUNGS EVERY 6 (SIX) HOURS AS NEEDED FOR WHEEZING (NO ADDITIONAL REFILLS) 18 each 0   No current facility-administered medications on file prior to visit.    Allergies:  Allergies  Allergen Reactions  . Hydrocodone Itching and Other (See Comments)    Hypersensitivity    Review of Systems:  CONSTITUTIONAL: No fevers, chills, night sweats, or weight loss.  EYES: No visual changes or eye pain ENT: No hearing changes.  No history of nose bleeds.   RESPIRATORY: No cough, wheezing and shortness of breath.   CARDIOVASCULAR: Negative for chest pain, and palpitations.   GI: Negative for abdominal discomfort, blood in stools or black stools.  No recent change in bowel habits.   GU:  No history of incontinence.   MUSCLOSKELETAL: No history of joint pain or swelling.  No myalgias.   SKIN: Negative for lesions, rash, and  itching.   ENDOCRINE: Negative for cold or heat intolerance, polydipsia or goiter.   PSYCH:  ++ depression or anxiety symptoms.   NEURO: As Above.   Vital Signs:  BP 130/90  HR 77  O2 98%  Neurological Exam: MENTAL STATUS including orientation to time, place, person, recent and remote memory, attention span and concentration, language, and fund of knowledge is normal.  Speech is not dysarthric.  CRANIAL NERVES: No visual field defects. Pupils equal round and reactive to light.  Normal conjugate, extra-ocular eye movements in all directions of gaze.   No ptosis. Normal facial sensation.  Face is symmetric. Palate elevates symmetrically.  Tongue is midline.  MOTOR:  Motor strength is 5/5 in all extremities.  No atrophy, fasciculations or abnormal movements.  No pronator drift.  Tone is normal.  Straight leg raise positive on left.  Tenderness to palpation over lumbosacral paraspinal muscles, especially on left.  MSRs:  Reflexes are 2+/4 throughout.  Plantars are down going..  SENSORY:  Intact to pin prick, vibration, and temperature throughout.  COORDINATION/GAIT:  Normal finger-to- nose-finger and heel-to-shin.  Intact rapid alternating movements bilaterally.  Gait narrow based and stable. Stressed gait intact.  He is able to perform squat without difficulty.  Data: Lab Results  Component Value Date   TSH 0.36 01/08/2014    IMPRESSION: 1.  Low back pain with radicular pain.  ?Sciatica vs S1 radiculopathy.  There is tenderness with point palpation of the lower back suggestive a musculoskeletal component.  Will try conservative therapy with PT and supportive treatment.  If no improvement, consider MRI L-spine. 2.  Anxiety  PLAN/RECOMMENDATIONS:  1.  Start flexeril 5mg  at bedtime x 1 week, then increase to 1 tablet twice daily. 2.  Start neurontin 300mg  at bedtime. 3.  Start physical therapy for leg stretching and low back pain 4.  Consider MRI lumbar spine, if no improvement 5.  Return to clinic 2- months   The duration of this appointment visit was 25 minutes of face-to-face time with the patient.  Greater than 50% of this time was spent in counseling, explanation of diagnosis, planning of further management, and coordination of care.   Thank you for allowing me to participate in patient's care.  If I can answer any additional questions, I would be pleased to do so.    Sincerely,    Deshae Dickison K. Posey Pronto, DO

## 2014-06-24 NOTE — Progress Notes (Signed)
Opened in error

## 2014-07-01 ENCOUNTER — Other Ambulatory Visit: Payer: Self-pay | Admitting: Internal Medicine

## 2014-07-07 ENCOUNTER — Other Ambulatory Visit: Payer: Self-pay | Admitting: Internal Medicine

## 2014-07-09 NOTE — Telephone Encounter (Signed)
Ok per Dr. Shawna Orleans to send in rx x 1 rf.  Rx called in to pharmacy.

## 2014-08-31 ENCOUNTER — Encounter: Payer: Self-pay | Admitting: Internal Medicine

## 2014-08-31 ENCOUNTER — Ambulatory Visit (INDEPENDENT_AMBULATORY_CARE_PROVIDER_SITE_OTHER): Payer: BC Managed Care – PPO | Admitting: Internal Medicine

## 2014-08-31 VITALS — BP 144/100 | HR 87 | Temp 98.1°F | Ht 73.0 in | Wt 217.0 lb

## 2014-08-31 DIAGNOSIS — F418 Other specified anxiety disorders: Secondary | ICD-10-CM

## 2014-08-31 DIAGNOSIS — F419 Anxiety disorder, unspecified: Secondary | ICD-10-CM

## 2014-08-31 DIAGNOSIS — M5440 Lumbago with sciatica, unspecified side: Secondary | ICD-10-CM | POA: Diagnosis not present

## 2014-08-31 DIAGNOSIS — M6281 Muscle weakness (generalized): Secondary | ICD-10-CM

## 2014-08-31 DIAGNOSIS — M545 Low back pain, unspecified: Secondary | ICD-10-CM

## 2014-08-31 DIAGNOSIS — F329 Major depressive disorder, single episode, unspecified: Secondary | ICD-10-CM

## 2014-08-31 MED ORDER — FLUOXETINE HCL 10 MG PO TABS: 10.0000 mg | ORAL_TABLET | Freq: Every day | ORAL | Status: DC

## 2014-08-31 MED ORDER — CLONAZEPAM 0.5 MG PO TABS: 0.5000 mg | ORAL_TABLET | Freq: Every day | ORAL | Status: DC

## 2014-08-31 NOTE — Assessment & Plan Note (Signed)
Patient having ongoing issues with bilateral back pain/sciatica. No significant improvement with use of gabapentin or Flexeril. I agree with proceeding with MRI of lumbar spine.  Copy of MRI of lumbar spine to Dr. Posey Pronto.

## 2014-08-31 NOTE — Assessment & Plan Note (Signed)
Good response to fluoxetine 10 mg and clonazepam 0.5 mg at bedtime. There has been some weight gain since previous visit. Patient to monitor caloric intake and reduce portion sizes. Reassess in 3 months.

## 2014-08-31 NOTE — Progress Notes (Signed)
Pre visit review using our clinic review tool, if applicable. No additional management support is needed unless otherwise documented below in the visit note. 

## 2014-08-31 NOTE — Progress Notes (Signed)
Subjective:    Patient ID: Kyle Miller, male    DOB: [birth year], 39 y.o.   MRN: [id number]  HPI  39 year old white male produces seen for anxiety and lower extremity muscle weakness for follow-up. Interval medical history-patient seen by neurology.  Patient continues to have spells of sciatica pain. His discomfort is especially worse in the morning. Hot baths seems to help his discomfort. He was treated with gabapentin and Flexeril. He reports no improvement with gabapentin. He has been taking over-the-counter ibuprofen 600 mg twice daily.  Patient also notes minimal improvement with physical therapy.  Depression/anxiety-at previous visit patient started on fluoxetine 10 mg and clonazepam at bedtime. Patient currently taking clonazepam 0.5 mg qhs. Patient reports depressive and anxiety symptoms much improved. There has been some weight gain since previous visit.  Review of Systems Negative for fever, weight gain    Past Medical History  Diagnosis Date  . Asthma   . Depression   . GERD (gastroesophageal reflux disease)     History   Social History  . Marital Status: Married    Spouse Name: N/A  . Number of Children: N/A  . Years of Education: N/A   Occupational History  . Not on file.   Social History Main Topics  . Smoking status: Never Smoker   . Smokeless tobacco: Never Used  . Alcohol Use: No  . Drug Use: No  . Sexual Activity: Not on file   Other Topics Concern  . Not on file   Social History Narrative   He live with wife and youngest daughter.   He works as a [employer] fast streamer.   Highest level of education:  College, BS    Past Surgical History  Procedure Laterality Date  . None      Family History  Problem Relation Age of Onset  . Healthy Mother   . Healthy Father   . Thyroid disease Brother   . Asthma Daughter     Allergies  Allergen Reactions  . Hydrocodone Itching and Other (See Comments)    Hypersensitivity    Current Outpatient Prescriptions  on File Prior to Visit  Medication Sig Dispense Refill  . cyclobenzaprine (FLEXERIL) 5 MG tablet Take one tablet at bedtime x 1 week, then increase to 1 tablet twice daily 60 tablet 3  . omeprazole (PRILOSEC OTC) 20 MG tablet Take 20 mg by mouth daily.    Marland Kitchen QVAR 80 MCG/ACT inhaler INHALE 2 PUFFS INTO THE LUNGS 2 (TWO) TIMES DAILY. 8.7 g 1  . VENTOLIN HFA 108 (90 BASE) MCG/ACT inhaler INHALE 2 PUFFS INTO THE LUNGS EVERY 6 (SIX) HOURS AS NEEDED FOR WHEEZING (NO ADDITIONAL REFILLS) 18 each 0   No current facility-administered medications on file prior to visit.    BP 144/100 mmHg  Pulse 87  Temp(Src) 98.1 F (36.7 C) (Oral)  Ht 6\' 1"  (1.854 m)  Wt 217 lb (98.431 kg)  BMI 28.64 kg/m2    Objective:   Physical Exam  Constitutional: He is oriented to person, place, and time. He appears well-developed and well-nourished. No distress.  HENT:  Head: Normocephalic and atraumatic.  Cardiovascular: Normal rate, regular rhythm and normal heart sounds.   No murmur heard. Pulmonary/Chest: Effort normal and breath sounds normal. He has no wheezes.  Musculoskeletal: He exhibits no edema.  Neurological: He is alert and oriented to person, place, and time. No cranial nerve deficit.  Skin: Skin is warm and dry.  Psychiatric: He has a normal mood and affect. His  behavior is normal.          Assessment & Plan:

## 2014-08-31 NOTE — Assessment & Plan Note (Signed)
Patient currently taking Ibuprofen 600 mg twice daily. Monitor renal function. Oral hydration encouraged.

## 2014-09-01 ENCOUNTER — Other Ambulatory Visit: Payer: Self-pay | Admitting: *Deleted

## 2014-09-01 LAB — BASIC METABOLIC PANEL
BUN: 11 mg/dL (ref 6–23)
CHLORIDE: 103 meq/L (ref 96–112)
CO2: 30 meq/L (ref 19–32)
Calcium: 9.9 mg/dL (ref 8.4–10.5)
Creatinine, Ser: 1.11 mg/dL (ref 0.40–1.50)
GFR: 77.88 mL/min (ref >60.00)
GLUCOSE: 85 mg/dL (ref 70–99)
Potassium: 4.4 mEq/L (ref 3.5–5.1)
Sodium: 139 mEq/L (ref 135–145)

## 2014-09-01 LAB — SEDIMENTATION RATE: Sed Rate: 3 mm/hr (ref 0–22)

## 2014-09-01 LAB — C-REACTIVE PROTEIN: CRP: 0.2 mg/dL — ABNORMAL LOW (ref 0.5–20.0)

## 2014-09-01 MED ORDER — SULINDAC 200 MG PO TABS: 200.0000 mg | ORAL_TABLET | Freq: Two times a day (BID) | ORAL | Status: DC | PRN

## 2014-09-16 ENCOUNTER — Ambulatory Visit (HOSPITAL_COMMUNITY)
Admission: RE | Admit: 2014-09-16 | Discharge: 2014-09-16 | Disposition: A | Discharge status: Home / Self Care | Payer: BC Managed Care – PPO | Source: Ambulatory Visit | Attending: Internal Medicine | Admitting: Internal Medicine

## 2014-09-16 DIAGNOSIS — M5126 Other intervertebral disc displacement, lumbar region: Secondary | ICD-10-CM | POA: Diagnosis not present

## 2014-09-16 DIAGNOSIS — G8929 Other chronic pain: Secondary | ICD-10-CM | POA: Insufficient documentation

## 2014-09-16 DIAGNOSIS — M5440 Lumbago with sciatica, unspecified side: Secondary | ICD-10-CM

## 2014-09-16 DIAGNOSIS — M545 Low back pain, unspecified: Secondary | ICD-10-CM

## 2014-09-18 ENCOUNTER — Other Ambulatory Visit: Payer: Self-pay | Admitting: Internal Medicine

## 2014-09-18 DIAGNOSIS — M5126 Other intervertebral disc displacement, lumbar region: Secondary | ICD-10-CM

## 2014-09-25 ENCOUNTER — Other Ambulatory Visit: Payer: Self-pay | Admitting: Internal Medicine

## 2014-11-13 ENCOUNTER — Other Ambulatory Visit: Payer: Self-pay | Admitting: Internal Medicine

## 2014-12-14 ENCOUNTER — Other Ambulatory Visit: Payer: Self-pay | Admitting: Internal Medicine

## 2015-02-23 ENCOUNTER — Telehealth: Payer: Self-pay | Admitting: Behavioral Health

## 2015-02-23 ENCOUNTER — Encounter: Payer: Self-pay | Admitting: Behavioral Health

## 2015-02-23 NOTE — Telephone Encounter (Signed)
Pre-Visit Call completed with patient and chart updated.   Pre-Visit Info documented in Specialty Comments under [employer].    

## 2015-02-24 ENCOUNTER — Encounter: Payer: Self-pay | Admitting: Physician Assistant

## 2015-02-24 ENCOUNTER — Ambulatory Visit (INDEPENDENT_AMBULATORY_CARE_PROVIDER_SITE_OTHER): Payer: BC Managed Care – PPO | Admitting: Physician Assistant

## 2015-02-24 VITALS — BP 120/84 | HR 72 | Temp 98.2°F | Resp 16 | Ht 73.0 in | Wt 224.0 lb

## 2015-02-24 DIAGNOSIS — F419 Anxiety disorder, unspecified: Secondary | ICD-10-CM

## 2015-02-24 DIAGNOSIS — L409 Psoriasis, unspecified: Secondary | ICD-10-CM | POA: Insufficient documentation

## 2015-02-24 DIAGNOSIS — J45909 Unspecified asthma, uncomplicated: Secondary | ICD-10-CM

## 2015-02-24 DIAGNOSIS — J45998 Other asthma: Secondary | ICD-10-CM

## 2015-02-24 DIAGNOSIS — F418 Other specified anxiety disorders: Secondary | ICD-10-CM | POA: Diagnosis not present

## 2015-02-24 DIAGNOSIS — F32A Depression, unspecified: Secondary | ICD-10-CM

## 2015-02-24 DIAGNOSIS — F329 Major depressive disorder, single episode, unspecified: Secondary | ICD-10-CM

## 2015-02-24 MED ORDER — SULINDAC 200 MG PO TABS: ORAL_TABLET | ORAL | Status: DC

## 2015-02-24 MED ORDER — BECLOMETHASONE DIPROPIONATE 80 MCG/ACT IN AERS: INHALATION_SPRAY | RESPIRATORY_TRACT | Status: DC

## 2015-02-24 MED ORDER — TRIAMCINOLONE ACETONIDE 0.1 % EX CREA: 1.0000 "application " | TOPICAL_CREAM | Freq: Two times a day (BID) | CUTANEOUS | Status: DC

## 2015-02-24 MED ORDER — CLONAZEPAM 0.5 MG PO TABS: 0.5000 mg | ORAL_TABLET | Freq: Every day | ORAL | Status: DC

## 2015-02-24 NOTE — Assessment & Plan Note (Signed)
Qvar restarted. Albuterol as directed.

## 2015-02-24 NOTE — Progress Notes (Signed)
Patient presents to clinic today to transfer care from Dr. Shawna Orleans. Wishes to discuss medication management.  Depression -- Currently of Prozac 10 mg daily and Klonopin 0.5 mg at bedtime.  Klonopin is helping with sleep as well as anxiety.  Is taking Fluoxetine 5 mg at bedtime with good relief of symptoms. Noticing weight gain despite exercise and diet regimen.   Asthma -- Previously well-controlled with Qvar and Albuterol. Has been out of Qvar for several months and is using Albuterol inhaler multiple times per day. Denies nighttime symptoms.   GERD -- Is taking Prilosec OTC as needed.   Patient with hx of psoriasis of posterior forearms bilaterally, previously well-controlled with Kenalog ointment. Is requesting refill and referral to Dermatology for other dermatologic complaints.  Past Medical History  Diagnosis Date  . Asthma   . Depression   . GERD (gastroesophageal reflux disease)   . Bulging lumbar disc   . History of chicken pox     Current Outpatient Prescriptions on File Prior to Visit  Medication Sig Dispense Refill  . FLUoxetine (PROZAC) 10 MG tablet Take 1 tablet (10 mg total) by mouth daily. 90 tablet 1  . omeprazole (PRILOSEC OTC) 20 MG tablet Take 20 mg by mouth daily.    . VENTOLIN HFA 108 (90 BASE) MCG/ACT inhaler INHALE 2 PUFFS INTO THE LUNGS EVERY 6 (SIX) HOURS AS NEEDED FOR WHEEZING (NO ADDITIONAL REFILLS) 18 Inhaler 2   No current facility-administered medications on file prior to visit.    Allergies  Allergen Reactions  . Hydrocodone Itching and Other (See Comments)    Hypersensitivity    Family History  Problem Relation Age of Onset  . Healthy Mother     Living  . Healthy Father     Living  . Thyroid disease Brother   . Asthma Daughter     Social History   Social History  . Marital Status: Married    Spouse Name: N/A  . Number of Children: N/A  . Years of Education: N/A   Social History Main Topics  . Smoking status: Never Smoker   .  Smokeless tobacco: Never Used  . Alcohol Use: No  . Drug Use: No  . Sexual Activity: Not Asked   Other Topics Concern  . None   Social History Narrative   He live with wife and youngest daughter.   He works as a [employer] fast streamer.   Highest level of education:  College, BS   Review of Systems - See HPI.  All other ROS are negative.  BP 120/84 mmHg  Pulse 72  Temp(Src) 98.2 F (36.8 C) (Oral)  Resp 16  Ht 6\' 1"  (1.854 m)  Wt 224 lb (101.606 kg)  BMI 29.56 kg/m2  SpO2 98%  Physical Exam  Constitutional: He is oriented to person, place, and time and well-developed, well-nourished, and in no distress.  HENT:  Head: Normocephalic and atraumatic.  Mouth/Throat: Oropharynx is clear and moist.  Eyes: Conjunctivae are normal.  Neck: Neck supple. No thyromegaly present.  Cardiovascular: Normal rate, regular rhythm, normal heart sounds and intact distal pulses.   Pulmonary/Chest: Effort normal and breath sounds normal. No respiratory distress. He has no wheezes. He has no rales. He exhibits no tenderness.  Genitourinary: Testes/scrotum normal.  Neurological: He is alert and oriented to person, place, and time.  Skin: Skin is warm and dry. No rash noted.  Psychiatric: Affect normal.  Vitals reviewed.   No results found for this or any previous visit (from the  past 2160 hour(s)).  Assessment/Plan: Asthma, moderate Qvar restarted. Albuterol as directed.   Anxiety and depression Stable. Fluoxetine as cause of weight gain. Patient already weaned to 5 mg daily without recurrence of anxiety. Will decrease to 5 mg QOD x 2 weeks before stopping. If symptoms recur he is to call or see [place] in office.  Psoriasis Mild. Kenalog refilled to use as directed. Referral to Dermatology placed.

## 2015-02-24 NOTE — Assessment & Plan Note (Signed)
Stable. Fluoxetine as cause of weight gain. Patient already weaned to 5 mg daily without recurrence of anxiety. Will decrease to 5 mg QOD x 2 weeks before stopping. If symptoms recur he is to call or see [place] in office.

## 2015-02-24 NOTE — Progress Notes (Signed)
Pre visit review using our clinic review tool, if applicable. No additional management support is needed unless otherwise documented below in the visit note/SLS  

## 2015-02-24 NOTE — Patient Instructions (Signed)
Please continue medications as directed with the following exceptions: (1) Restart the Qvar as directed for asthma symptoms (2) Decrease the Fluoxetine to 1/2 tablet every other day for 2 weeks before stopping medication. If anxiety returns, restart current dose and call me.  Keep up the diet and exercise regimen. You will be contacted by Dermatology so keep your phone on!

## 2015-02-24 NOTE — Assessment & Plan Note (Signed)
Mild. Kenalog refilled to use as directed. Referral to Dermatology placed.

## 2015-04-14 ENCOUNTER — Other Ambulatory Visit: Payer: Self-pay | Admitting: Internal Medicine

## 2015-06-13 ENCOUNTER — Other Ambulatory Visit: Payer: Self-pay | Admitting: Internal Medicine

## 2015-06-13 ENCOUNTER — Other Ambulatory Visit: Payer: Self-pay | Admitting: Physician Assistant

## 2015-06-16 ENCOUNTER — Other Ambulatory Visit: Payer: Self-pay | Admitting: Physician Assistant

## 2015-06-16 ENCOUNTER — Other Ambulatory Visit: Payer: Self-pay | Admitting: Internal Medicine

## 2015-06-29 ENCOUNTER — Encounter: Payer: Self-pay | Admitting: Physician Assistant

## 2015-06-29 ENCOUNTER — Ambulatory Visit (INDEPENDENT_AMBULATORY_CARE_PROVIDER_SITE_OTHER): Payer: BC Managed Care – PPO | Admitting: Physician Assistant

## 2015-06-29 VITALS — BP 128/90 | HR 97 | Temp 97.9°F | Ht 73.0 in | Wt 213.5 lb

## 2015-06-29 DIAGNOSIS — J019 Acute sinusitis, unspecified: Secondary | ICD-10-CM | POA: Diagnosis not present

## 2015-06-29 DIAGNOSIS — B9689 Other specified bacterial agents as the cause of diseases classified elsewhere: Secondary | ICD-10-CM | POA: Insufficient documentation

## 2015-06-29 MED ORDER — AMOXICILLIN-POT CLAVULANATE 875-125 MG PO TABS: 1.0000 | ORAL_TABLET | Freq: Two times a day (BID) | ORAL | Status: DC

## 2015-06-29 NOTE — Assessment & Plan Note (Signed)
Rx Augmentin.  Increase fluids.  Rest.  Saline nasal spray.  Probiotic.  Mucinex as directed.  Humidifier in bedroom. Tylenol Sinus as directed.  Call or return to clinic if symptoms are not improving.

## 2015-06-29 NOTE — Patient Instructions (Signed)
Please take antibiotic as directed.  Increase fluid intake.  Use Saline nasal spray.  Take a daily multivitamin. Continue Tylenol Sinus.  Place a humidifier in the bedroom.  Please call or return clinic if symptoms are not improving.  Sinusitis Sinusitis is redness, soreness, and swelling (inflammation) of the paranasal sinuses. Paranasal sinuses are air pockets within the bones of your face (beneath the eyes, the middle of the forehead, or above the eyes). In healthy paranasal sinuses, mucus is able to drain out, and air is able to circulate through them by way of your nose. However, when your paranasal sinuses are inflamed, mucus and air can become trapped. This can allow bacteria and other germs to grow and cause infection. Sinusitis can develop quickly and last only a short time (acute) or continue over a long period (chronic). Sinusitis that lasts for more than 12 weeks is considered chronic.  CAUSES  Causes of sinusitis include:  Allergies.  Structural abnormalities, such as displacement of the cartilage that separates your nostrils (deviated septum), which can decrease the air flow through your nose and sinuses and affect sinus drainage.  Functional abnormalities, such as when the small hairs (cilia) that line your sinuses and help remove mucus do not work properly or are not present. SYMPTOMS  Symptoms of acute and chronic sinusitis are the same. The primary symptoms are pain and pressure around the affected sinuses. Other symptoms include:  Upper toothache.  Earache.  Headache.  Bad breath.  Decreased sense of smell and taste.  A cough, which worsens when you are lying flat.  Fatigue.  Fever.  Thick drainage from your nose, which often is green and may contain pus (purulent).  Swelling and warmth over the affected sinuses. DIAGNOSIS  Your caregiver will perform a physical exam. During the exam, your caregiver may:  Look in your nose for signs of abnormal growths in your  nostrils (nasal polyps).  Tap over the affected sinus to check for signs of infection.  View the inside of your sinuses (endoscopy) with a special imaging device with a light attached (endoscope), which is inserted into your sinuses. If your caregiver suspects that you have chronic sinusitis, one or more of the following tests may be recommended:  Allergy tests.  Nasal culture A sample of mucus is taken from your nose and sent to a lab and screened for bacteria.  Nasal cytology A sample of mucus is taken from your nose and examined by your caregiver to determine if your sinusitis is related to an allergy. TREATMENT  Most cases of acute sinusitis are related to a viral infection and will resolve on their own within 10 days. Sometimes medicines are prescribed to help relieve symptoms (pain medicine, decongestants, nasal steroid sprays, or saline sprays).  However, for sinusitis related to a bacterial infection, your caregiver will prescribe antibiotic medicines. These are medicines that will help kill the bacteria causing the infection.  Rarely, sinusitis is caused by a fungal infection. In theses cases, your caregiver will prescribe antifungal medicine. For some cases of chronic sinusitis, surgery is needed. Generally, these are cases in which sinusitis recurs more than 3 times per year, despite other treatments. HOME CARE INSTRUCTIONS   Drink plenty of water. Water helps thin the mucus so your sinuses can drain more easily.  Use a humidifier.  Inhale steam 3 to 4 times a day (for example, sit in the bathroom with the shower running).  Apply a warm, moist washcloth to your face 3 to 4   times a day, or as directed by your caregiver.  Use saline nasal sprays to help moisten and clean your sinuses.  Take over-the-counter or prescription medicines for pain, discomfort, or fever only as directed by your caregiver. SEEK IMMEDIATE MEDICAL CARE IF:  You have increasing pain or severe  headaches.  You have nausea, vomiting, or drowsiness.  You have swelling around your face.  You have vision problems.  You have a stiff neck.  You have difficulty breathing. MAKE SURE YOU:   Understand these instructions.  Will watch your condition.  Will get help right away if you are not doing well or get worse. Document Released: 05/22/2005 Document Revised: 08/14/2011 Document Reviewed: 06/06/2011 [employer] Patient Information 2014 [employer].   

## 2015-06-29 NOTE — Progress Notes (Signed)
Patient presents to clinic today c/o 1.5 weeks of sinus pressure, sinus headache with L facial pressure. Endorses ear pressure and R ear pain. Denies fever.  Denies productive cough. Denies recent foreign travel. Denies sick contact. Has used a Catering manager and Taken tylenol sinus with only some relief in symptoms.   Past Medical History  Diagnosis Date  . Asthma   . Depression   . GERD (gastroesophageal reflux disease)   . Bulging lumbar disc   . History of chicken pox     Current Outpatient Prescriptions on File Prior to Visit  Medication Sig Dispense Refill  . beclomethasone (QVAR) 80 MCG/ACT inhaler INHALE 2 PUFFS INTO THE LUNGS 2 (TWO) TIMES DAILY. 8.7 g 3  . clonazePAM (KLONOPIN) 0.5 MG tablet Take 1 tablet (0.5 mg total) by mouth at bedtime. 30 tablet 3  . FLUoxetine (PROZAC) 10 MG tablet TAKE 1 TABLET (10 MG TOTAL) BY MOUTH DAILY. 90 tablet 1  . omeprazole (PRILOSEC OTC) 20 MG tablet Take 20 mg by mouth daily.    . sulindac (CLINORIL) 200 MG tablet TAKE 1 TABLET (200 MG TOTAL) BY MOUTH 2 (TWO) TIMES DAILY AS NEEDED. 60 tablet 1  . triamcinolone cream (KENALOG) 0.1 % Apply 1 application topically 2 (two) times daily. 30 g 0  . VENTOLIN HFA 108 (90 BASE) MCG/ACT inhaler INHALE 2 PUFFS INTO THE LUNGS EVERY 6 (SIX) HOURS AS NEEDED FOR WHEEZING (NO ADDITIONAL REFILLS) 18 Inhaler 2  . vitamin E 400 UNIT capsule Take 400 Units by mouth daily.     No current facility-administered medications on file prior to visit.    Allergies  Allergen Reactions  . Hydrocodone Itching and Other (See Comments)    Hypersensitivity    Family History  Problem Relation Age of Onset  . Healthy Mother     Living  . Healthy Father     Living  . Thyroid disease Brother   . Asthma Daughter     Social History   Social History  . Marital Status: Married    Spouse Name: N/A  . Number of Children: N/A  . Years of Education: N/A   Social History Main Topics  . Smoking status: Never Smoker   .  Smokeless tobacco: Never Used  . Alcohol Use: No  . Drug Use: No  . Sexual Activity: Not Asked   Other Topics Concern  . None   Social History Narrative   He live with wife and youngest daughter.   He works as a [employer] fast streamer.   Highest level of education:  College, BS   Review of Systems - See HPI.  All other ROS are negative.  BP 128/90 mmHg  Pulse 97  Temp(Src) 97.9 F (36.6 C) (Oral)  Ht 6\' 1"  (1.854 m)  Wt 213 lb 8 oz (96.843 kg)  BMI 28.17 kg/m2  SpO2 97%  Physical Exam  Constitutional: He is oriented to person, place, and time and well-developed, well-nourished, and in no distress.  HENT:  Head: Normocephalic and atraumatic.  Right Ear: Tympanic membrane normal.  Left Ear: Tympanic membrane normal.  Nose: Mucosal edema present. Left sinus exhibits maxillary sinus tenderness and frontal sinus tenderness.  Mouth/Throat: Uvula is midline, oropharynx is clear and moist and mucous membranes are normal.  Cardiovascular: Normal rate, regular rhythm, normal heart sounds and intact distal pulses.   Pulmonary/Chest: Effort normal and breath sounds normal. No respiratory distress. He has no wheezes. He has no rales. He exhibits no tenderness.  Neurological:  He is alert and oriented to person, place, and time.  Skin: Skin is warm and dry. No rash noted.  Psychiatric: Affect normal.     Assessment/Plan: Acute bacterial sinusitis Rx Augmentin.  Increase fluids.  Rest.  Saline nasal spray.  Probiotic.  Mucinex as directed.  Humidifier in bedroom. Tylenol Sinus as directed.  Call or return to clinic if symptoms are not improving.

## 2015-06-30 ENCOUNTER — Telehealth: Payer: Self-pay | Admitting: Physician Assistant

## 2015-06-30 NOTE — Telephone Encounter (Signed)
Patient declined Flu Shot °

## 2015-07-15 ENCOUNTER — Other Ambulatory Visit: Payer: Self-pay | Admitting: Physician Assistant

## 2015-08-09 ENCOUNTER — Ambulatory Visit (INDEPENDENT_AMBULATORY_CARE_PROVIDER_SITE_OTHER): Payer: BC Managed Care – PPO | Admitting: Physician Assistant

## 2015-08-09 ENCOUNTER — Encounter: Payer: Self-pay | Admitting: Physician Assistant

## 2015-08-09 VITALS — BP 136/86 | HR 83 | Temp 98.1°F | Ht 73.0 in | Wt 218.8 lb

## 2015-08-09 DIAGNOSIS — J329 Chronic sinusitis, unspecified: Secondary | ICD-10-CM | POA: Diagnosis not present

## 2015-08-09 MED ORDER — LEVOFLOXACIN 500 MG PO TABS: 500.0000 mg | ORAL_TABLET | Freq: Every day | ORAL | Status: DC

## 2015-08-09 NOTE — Assessment & Plan Note (Signed)
Rx Levaquin. Continue measures discussed at last visit. Will check CBC today due to recurrent infection.

## 2015-08-09 NOTE — Patient Instructions (Signed)
Go to lab for blood work.  I will call with your results.  Please take antibiotic as directed.  Increase fluid intake.  Use Saline nasal spray.  Take a daily multivitamin. Restart Flonase.  Place a humidifier in the bedroom.  Please call or return clinic if symptoms are not improving.  Sinusitis Sinusitis is redness, soreness, and swelling (inflammation) of the paranasal sinuses. Paranasal sinuses are air pockets within the bones of your face (beneath the eyes, the middle of the forehead, or above the eyes). In healthy paranasal sinuses, mucus is able to drain out, and air is able to circulate through them by way of your nose. However, when your paranasal sinuses are inflamed, mucus and air can become trapped. This can allow bacteria and other germs to grow and cause infection. Sinusitis can develop quickly and last only a short time (acute) or continue over a long period (chronic). Sinusitis that lasts for more than 12 weeks is considered chronic.  CAUSES  Causes of sinusitis include:  Allergies.  Structural abnormalities, such as displacement of the cartilage that separates your nostrils (deviated septum), which can decrease the air flow through your nose and sinuses and affect sinus drainage.  Functional abnormalities, such as when the small hairs (cilia) that line your sinuses and help remove mucus do not work properly or are not present. SYMPTOMS  Symptoms of acute and chronic sinusitis are the same. The primary symptoms are pain and pressure around the affected sinuses. Other symptoms include:  Upper toothache.  Earache.  Headache.  Bad breath.  Decreased sense of smell and taste.  A cough, which worsens when you are lying flat.  Fatigue.  Fever.  Thick drainage from your nose, which often is green and may contain pus (purulent).  Swelling and warmth over the affected sinuses. DIAGNOSIS  Your caregiver will perform a physical exam. During the exam, your caregiver  may:  Look in your nose for signs of abnormal growths in your nostrils (nasal polyps).  Tap over the affected sinus to check for signs of infection.  View the inside of your sinuses (endoscopy) with a special imaging device with a light attached (endoscope), which is inserted into your sinuses. If your caregiver suspects that you have chronic sinusitis, one or more of the following tests may be recommended:  Allergy tests.  Nasal culture A sample of mucus is taken from your nose and sent to a lab and screened for bacteria.  Nasal cytology A sample of mucus is taken from your nose and examined by your caregiver to determine if your sinusitis is related to an allergy. TREATMENT  Most cases of acute sinusitis are related to a viral infection and will resolve on their own within 10 days. Sometimes medicines are prescribed to help relieve symptoms (pain medicine, decongestants, nasal steroid sprays, or saline sprays).  However, for sinusitis related to a bacterial infection, your caregiver will prescribe antibiotic medicines. These are medicines that will help kill the bacteria causing the infection.  Rarely, sinusitis is caused by a fungal infection. In theses cases, your caregiver will prescribe antifungal medicine. For some cases of chronic sinusitis, surgery is needed. Generally, these are cases in which sinusitis recurs more than 3 times per year, despite other treatments. HOME CARE INSTRUCTIONS   Drink plenty of water. Water helps thin the mucus so your sinuses can drain more easily.  Use a humidifier.  Inhale steam 3 to 4 times a day (for example, sit in the bathroom with the shower  running).  Apply a warm, moist washcloth to your face 3 to 4 times a day, or as directed by your caregiver.  Use saline nasal sprays to help moisten and clean your sinuses.  Take over-the-counter or prescription medicines for pain, discomfort, or fever only as directed by your caregiver. SEEK IMMEDIATE  MEDICAL CARE IF:  You have increasing pain or severe headaches.  You have nausea, vomiting, or drowsiness.  You have swelling around your face.  You have vision problems.  You have a stiff neck.  You have difficulty breathing. MAKE SURE YOU:   Understand these instructions.  Will watch your condition.  Will get help right away if you are not doing well or get worse. Document Released: 05/22/2005 Document Revised: 08/14/2011 Document Reviewed: 06/06/2011 [facility] Patient Information 2014 [place], [place].

## 2015-08-09 NOTE — Progress Notes (Signed)
Pre visit review using our clinic review tool, if applicable. No additional management support is needed unless otherwise documented below in the visit note. 

## 2015-08-09 NOTE — Progress Notes (Signed)
Patient presents to clinic today c/o sinus pressure, sinus pain, facial pain since Wednesday. Patient endorses history of sinusitis. Endorses symptoms have progressively worsened since onset. Endorses mild low-grade fever. Denies nausea, vomiting, diarrhea. Denies recent travel or sick contact. Was recently on Augmentin without complete relief of symptoms. Symptoms have worsened after completion of antibiotic.  Past Medical History  Diagnosis Date  . Asthma   . Depression   . GERD (gastroesophageal reflux disease)   . Bulging lumbar disc   . History of chicken pox     Current Outpatient Prescriptions on File Prior to Visit  Medication Sig Dispense Refill  . beclomethasone (QVAR) 80 MCG/ACT inhaler INHALE 2 PUFFS INTO THE LUNGS 2 (TWO) TIMES DAILY. 8.7 g 3  . clonazePAM (KLONOPIN) 0.5 MG tablet TAKE 1 TABLET BY MOUTH AT BEDTIME 30 tablet 2  . FLUoxetine (PROZAC) 10 MG tablet TAKE 1 TABLET (10 MG TOTAL) BY MOUTH DAILY. 90 tablet 1  . Melatonin 5 MG TABS Take by mouth.    Marland Kitchen omeprazole (PRILOSEC OTC) 20 MG tablet Take 20 mg by mouth daily.    . sulindac (CLINORIL) 200 MG tablet TAKE 1 TABLET (200 MG TOTAL) BY MOUTH 2 (TWO) TIMES DAILY AS NEEDED. 60 tablet 1  . triamcinolone cream (KENALOG) 0.1 % Apply 1 application topically 2 (two) times daily. 30 g 0  . VENTOLIN HFA 108 (90 BASE) MCG/ACT inhaler INHALE 2 PUFFS INTO THE LUNGS EVERY 6 (SIX) HOURS AS NEEDED FOR WHEEZING (NO ADDITIONAL REFILLS) 18 Inhaler 2  . vitamin E 400 UNIT capsule Take 400 Units by mouth daily.     No current facility-administered medications on file prior to visit.    Allergies  Allergen Reactions  . Hydrocodone Itching and Other (See Comments)    Hypersensitivity    Family History  Problem Relation Age of Onset  . Healthy Mother     Living  . Healthy Father     Living  . Thyroid disease Brother   . Asthma Daughter     Social History   Social History  . Marital Status: Married    Spouse Name: N/A    . Number of Children: N/A  . Years of Education: N/A   Social History Main Topics  . Smoking status: Never Smoker   . Smokeless tobacco: Never Used  . Alcohol Use: No  . Drug Use: No  . Sexual Activity: Not Asked   Other Topics Concern  . None   Social History Narrative   He live with wife and youngest daughter.   He works as a [employer] fast streamer.   Highest level of education:  College, BS   Review of Systems - See HPI.  All other ROS are negative.  BP 136/86 mmHg  Pulse 83  Temp(Src) 98.1 F (36.7 C) (Oral)  Ht 6\' 1"  (1.854 m)  Wt 218 lb 12.8 oz (99.247 kg)  BMI 28.87 kg/m2  SpO2 98%  Physical Exam  Constitutional: He is oriented to person, place, and time and well-developed, well-nourished, and in no distress.  HENT:  Head: Normocephalic and atraumatic.  Right Ear: Tympanic membrane and external ear normal.  Left Ear: Tympanic membrane and external ear normal.  Nose: Mucosal edema and rhinorrhea present. Right sinus exhibits maxillary sinus tenderness.  Mouth/Throat: Uvula is midline, oropharynx is clear and moist and mucous membranes are normal.  Eyes: Conjunctivae are normal.  Neck: Neck supple.  Cardiovascular: Normal rate, regular rhythm, normal heart sounds and intact distal pulses.  Pulmonary/Chest: Effort normal and breath sounds normal. No respiratory distress. He has no wheezes. He has no rales. He exhibits no tenderness.  Neurological: He is alert and oriented to person, place, and time.  Skin: Skin is warm and dry. No rash noted.  Psychiatric: Affect normal.  Vitals reviewed.    Assessment/Plan: Recurrent sinusitis Rx Levaquin. Continue measures discussed at last visit. Will check CBC today due to recurrent infection.

## 2015-08-10 ENCOUNTER — Encounter: Payer: Self-pay | Admitting: Physician Assistant

## 2015-08-10 LAB — CBC WITH DIFFERENTIAL/PLATELET
Basophils Absolute: 0 10*3/uL (ref 0.0–0.1)
Basophils Relative: 0.5 % (ref 0.0–3.0)
EOS ABS: 0.3 10*3/uL (ref 0.0–0.7)
Eosinophils Relative: 2.5 % (ref 0.0–5.0)
HCT: 46 % (ref 39.0–52.0)
HEMOGLOBIN: 15.3 g/dL (ref 13.0–17.0)
Lymphocytes Relative: 25.3 % (ref 12.0–46.0)
Lymphs Abs: 2.6 10*3/uL (ref 0.7–4.0)
MCHC: 33.2 g/dL (ref 30.0–36.0)
MCV: 90.1 fl (ref 78.0–100.0)
MONO ABS: 0.8 10*3/uL (ref 0.1–1.0)
Monocytes Relative: 7.5 % (ref 3.0–12.0)
Neutro Abs: 6.6 10*3/uL (ref 1.4–7.7)
Neutrophils Relative %: 64.2 % (ref 43.0–77.0)
Platelets: 301 10*3/uL (ref 150.0–400.0)
RBC: 5.11 Mil/uL (ref 4.22–5.81)
RDW: 13.2 % (ref 11.5–15.5)
WBC: 10.2 10*3/uL (ref 4.0–10.5)

## 2015-08-12 ENCOUNTER — Telehealth: Payer: Self-pay

## 2015-08-12 NOTE — Telephone Encounter (Signed)
[facility] note received via fax  Call:   Date:  08/08/15 Time: 12:10:00pm   Caller:  Patient Return number: [phone]  Nurse: Noah Delaine, RN  Chief Complaint:  Headache  Reason for call:  Caller states has headache, no energy; can't get warm; temp yesterday 99.5  Related visit to physician within the last 2 weeks:  No  Guideline:  Headache-Moderate  Disposition:  See Physician within 4 hours (or PCP triage)  Comments:  Patient states he has used a neti pot, taken Mucinex and Sinus Tylenol, but nothing is helping with the headache.  Rated 7 on 0-10 pain scale.  Nausea sometimes, but no vomiting.  Really feeling a lack of energy due to headache.

## 2015-08-12 NOTE — Telephone Encounter (Signed)
Pt was seen by Elyn Aquas, PA-C 08/09/15.

## 2015-08-25 ENCOUNTER — Encounter: Payer: Self-pay | Admitting: Physician Assistant

## 2015-08-25 ENCOUNTER — Telehealth: Payer: Self-pay | Admitting: Physician Assistant

## 2015-08-25 NOTE — Telephone Encounter (Signed)
See MyChart message. We need to keep an eye out on the forms for his inhaler.

## 2015-08-25 NOTE — Telephone Encounter (Signed)
Caller name: Self  Can be reached: [phone]  Pharmacy:  [facility] #[id number] - [place], [place]. [phone] (Phone) [phone] (Fax)        Reason for call: Request refill on VENTOLIN HFA 108 (90 BASE) MCG/ACT inhaler [id number]

## 2015-08-25 NOTE — Telephone Encounter (Signed)
Called and spoke with the pt and informed him of the note below.  Pt verbalized understanding.  Pt stated that he will wait for the PA.  He stated that he spoke with the pharmacy and they informed him that the inhaler would be ($170.00).  Informed him that the PA is been worked on.//AB/CMA

## 2015-08-25 NOTE — Telephone Encounter (Signed)
Call to check on this. Patient sent MyChart message stating PA was needed for refill. I have already forwarded this to Ivin Booty to be on lookout for PA. See if he is wanting to pay for refill before PA is complete.

## 2015-08-30 ENCOUNTER — Encounter: Payer: Self-pay | Admitting: Physician Assistant

## 2015-08-31 MED ORDER — ALBUTEROL SULFATE HFA 108 (90 BASE) MCG/ACT IN AERS: 1.0000 | INHALATION_SPRAY | Freq: Four times a day (QID) | RESPIRATORY_TRACT | Status: DC | PRN

## 2015-09-13 ENCOUNTER — Other Ambulatory Visit: Payer: Self-pay | Admitting: Physician Assistant

## 2015-10-06 NOTE — Telephone Encounter (Signed)
Jess, will you let me know if you receive this [PA on Albuterol/Ventolin HFA]/SLS Thanks.

## 2015-10-13 ENCOUNTER — Ambulatory Visit (INDEPENDENT_AMBULATORY_CARE_PROVIDER_SITE_OTHER): Payer: BC Managed Care – PPO | Admitting: Physician Assistant

## 2015-10-13 ENCOUNTER — Encounter: Payer: Self-pay | Admitting: Physician Assistant

## 2015-10-13 VITALS — BP 138/88 | HR 73 | Temp 98.4°F | Resp 16 | Ht 73.0 in | Wt 217.1 lb

## 2015-10-13 DIAGNOSIS — F418 Other specified anxiety disorders: Secondary | ICD-10-CM

## 2015-10-13 DIAGNOSIS — F419 Anxiety disorder, unspecified: Principal | ICD-10-CM

## 2015-10-13 DIAGNOSIS — R030 Elevated blood-pressure reading, without diagnosis of hypertension: Secondary | ICD-10-CM | POA: Diagnosis not present

## 2015-10-13 DIAGNOSIS — F329 Major depressive disorder, single episode, unspecified: Secondary | ICD-10-CM

## 2015-10-13 DIAGNOSIS — R5383 Other fatigue: Secondary | ICD-10-CM

## 2015-10-13 LAB — COMPREHENSIVE METABOLIC PANEL
ALK PHOS: 62 U/L (ref 39–117)
ALT: 17 U/L (ref 0–53)
AST: 12 U/L (ref 0–37)
Albumin: 4.7 g/dL (ref 3.5–5.2)
BILIRUBIN TOTAL: 0.3 mg/dL (ref 0.2–1.2)
BUN: 16 mg/dL (ref 6–23)
CALCIUM: 9.8 mg/dL (ref 8.4–10.5)
CO2: 30 meq/L (ref 19–32)
CREATININE: 0.94 mg/dL (ref 0.40–1.50)
Chloride: 101 mEq/L (ref 96–112)
GFR: 93.83 mL/min (ref >60.00)
GLUCOSE: 95 mg/dL (ref 70–99)
Potassium: 4.5 mEq/L (ref 3.5–5.1)
Sodium: 138 mEq/L (ref 135–145)
TOTAL PROTEIN: 7.5 g/dL (ref 6.0–8.3)

## 2015-10-13 LAB — CBC
HCT: 46.7 % (ref 39.0–52.0)
HEMOGLOBIN: 15.7 g/dL (ref 13.0–17.0)
MCHC: 33.6 g/dL (ref 30.0–36.0)
MCV: 89.3 fl (ref 78.0–100.0)
PLATELETS: 354 10*3/uL (ref 150.0–400.0)
RBC: 5.23 Mil/uL (ref 4.22–5.81)
RDW: 13.1 % (ref 11.5–15.5)
WBC: 14.5 10*3/uL — AB (ref 4.0–10.5)

## 2015-10-13 LAB — TESTOSTERONE: Testosterone: 230.66 ng/dL — ABNORMAL LOW (ref 300.00–890.00)

## 2015-10-13 LAB — TSH: TSH: 2.33 u[IU]/mL (ref 0.35–4.50)

## 2015-10-13 MED ORDER — VENLAFAXINE HCL ER 37.5 MG PO CP24: 37.5000 mg | ORAL_CAPSULE | Freq: Every day | ORAL | Status: DC

## 2015-10-13 NOTE — Patient Instructions (Signed)
Please go to the lab for blood work. I will call with results. Please follow the dietary instructions below to help with BP. Stay active.  Stop the Prozac and begin the Effexor taking as directed. Continue Klonopin.  Follow-up with me in 3-4 weeks.

## 2015-10-13 NOTE — Progress Notes (Signed)
Pre visit review using our clinic review tool, if applicable. No additional management support is needed unless otherwise documented below in the visit note/SLS  

## 2015-10-13 NOTE — Progress Notes (Signed)
Patient presents to clinic today at the request of his pain specialist due an elevated BP noted during an spinal injection. Endorses BP was >160/100 at time of procedure but states he was in pain. Does have history of transient elevation in BP. Patient denies chest pain, palpitations, lightheadedness, dizziness, vision changes or frequent headaches.  BP Readings from Last 3 Encounters:  10/13/15 138/88  08/09/15 136/86  06/29/15 128/90   Endorses he has had worsening anxiety and anhedonia due to multiple stressors including continued pain which specialist is working hard to alleviate. Denies SI/HI.  Past Medical History  Diagnosis Date  . Asthma   . Depression   . GERD (gastroesophageal reflux disease)   . Bulging lumbar disc   . History of chicken pox     Current Outpatient Prescriptions on File Prior to Visit  Medication Sig Dispense Refill  . albuterol (PROAIR HFA) 108 (90 Base) MCG/ACT inhaler Inhale 1-2 puffs into the lungs every 6 (six) hours as needed for wheezing or shortness of breath. 6.7 g 5  . clonazePAM (KLONOPIN) 0.5 MG tablet TAKE 1 TABLET BY MOUTH AT BEDTIME 30 tablet 2  . Melatonin 5 MG TABS Take by mouth.    Marland Kitchen omeprazole (PRILOSEC OTC) 20 MG tablet Take 20 mg by mouth daily.    . sulindac (CLINORIL) 200 MG tablet TAKE 1 TABLET BY MOUTH 2 TIMES DAILY AS NEEDED. 60 tablet 1  . triamcinolone cream (KENALOG) 0.1 % Apply 1 application topically 2 (two) times daily. (Patient taking differently: Apply 1 application topically 2 (two) times daily as needed. ) 30 g 0  . vitamin E 400 UNIT capsule Take 400 Units by mouth daily.    . beclomethasone (QVAR) 80 MCG/ACT inhaler INHALE 2 PUFFS INTO THE LUNGS 2 (TWO) TIMES DAILY. (Patient not taking: Reported on 10/13/2015) 8.7 g 3   No current facility-administered medications on file prior to visit.    Allergies  Allergen Reactions  . Hydrocodone Itching and Other (See Comments)    Hypersensitivity    Family History    Problem Relation Age of Onset  . Healthy Mother     Living  . Healthy Father     Living  . Thyroid disease Brother   . Asthma Daughter     Social History   Social History  . Marital Status: Married    Spouse Name: N/A  . Number of Children: N/A  . Years of Education: N/A   Social History Main Topics  . Smoking status: Never Smoker   . Smokeless tobacco: Never Used  . Alcohol Use: No  . Drug Use: No  . Sexual Activity: Not Asked   Other Topics Concern  . None   Social History Narrative   He live with wife and youngest daughter.   He works as a [employer] fast streamer.   Highest level of education:  College, BS   Review of Systems - See HPI.  All other ROS are negative.  BP 138/88 mmHg  Pulse 73  Temp(Src) 98.4 F (36.9 C) (Oral)  Resp 16  Ht _0  (1.854 m)  Wt 217 lb 2 oz (98.487 kg)  BMI 28.65 kg/m2  SpO2 98%  Physical Exam  Constitutional: He is oriented to person, place, and time and well-developed, well-nourished, and in no distress.  HENT:  Head: Normocephalic and atraumatic.  Eyes: Conjunctivae are normal.  Neck: Neck supple. No thyromegaly present.  Cardiovascular: Normal rate, regular rhythm, normal heart sounds and intact distal pulses.  Pulmonary/Chest: Effort normal and breath sounds normal. No respiratory distress. He has no wheezes. He has no rales. He exhibits no tenderness.  Neurological: He is alert and oriented to person, place, and time.  Skin: Skin is warm and dry. No rash noted.  Psychiatric: Affect normal.  Vitals reviewed.   Recent Results (from the past 2160 hour(s))  CBC w/Diff     Status: None   Collection Time: 08/09/15  5:03 PM  Result Value Ref Range   WBC 10.2 4.0 - 10.5 K/uL   RBC 5.11 4.22 - 5.81 Mil/uL   Hemoglobin 15.3 13.0 - 17.0 g/dL   HCT 46.0 39.0 - 52.0 %   MCV 90.1 78.0 - 100.0 fl   MCHC 33.2 30.0 - 36.0 g/dL   RDW 13.2 11.5 - 15.5 %   Platelets 301.0 150.0 - 400.0 K/uL   Neutrophils Relative % 64.2 43.0 - 77.0 %    Lymphocytes Relative 25.3 12.0 - 46.0 %   Monocytes Relative 7.5 3.0 - 12.0 %   Eosinophils Relative 2.5 0.0 - 5.0 %   Basophils Relative 0.5 0.0 - 3.0 %   Neutro Abs 6.6 1.4 - 7.7 K/uL   Lymphs Abs 2.6 0.7 - 4.0 K/uL   Monocytes Absolute 0.8 0.1 - 1.0 K/uL   Eosinophils Absolute 0.3 0.0 - 0.7 K/uL   Basophils Absolute 0.0 0.0 - 0.1 K/uL    Assessment/Plan: 1. Anxiety and depression Only taking 5 mg of Fluoxetine since he felt higher dose was affecting weight and was not helping symptoms. Will stop and begin Effexor to get better relief over symptoms. Will check TSH today. FU 1 month.  2. Other fatigue Multifactorial -- anxiety/pain a part of the issue. Will check lab panel to further assess. Supportive measures reviewed. SSRI changed for Effexor XR.   - CBC - Comp Met (CMET) - TSH - Testosterone  3. Prehypertension BP today at 138/88. DASH diet discussed. Will check labs today. Will FU 3-4 weeks. Feel once pain and anxiety are under better control, these levels will improve.  - Comp Met (CMET) - TSH

## 2015-10-17 ENCOUNTER — Encounter: Payer: Self-pay | Admitting: Physician Assistant

## 2015-10-18 NOTE — Telephone Encounter (Signed)
Please see result note for recent labs with my instructions. Call patient and advise of next steps.

## 2015-10-19 ENCOUNTER — Telehealth: Payer: Self-pay | Admitting: Physician Assistant

## 2015-10-19 NOTE — Telephone Encounter (Signed)
Pt returning your call for lab results  °

## 2015-10-19 NOTE — Telephone Encounter (Signed)
Initiated new PA for Ventolin HFA via Cover My Meds [Key [id number]]; awaiting response/SLS 05/15

## 2015-10-19 NOTE — Telephone Encounter (Signed)
SEE Result note. 

## 2015-10-20 ENCOUNTER — Other Ambulatory Visit: Payer: Self-pay | Admitting: *Deleted

## 2015-10-20 NOTE — Telephone Encounter (Signed)
Received Approval for Ventolin HFA via CMM; called and spoke with pharmacy, it does process but will still cost patient twice the price of the ProAir HFA. Called patient and [facility] with contact name and number RE: Approval & cost according to pharmacy/SLS 05/17

## 2015-10-26 ENCOUNTER — Other Ambulatory Visit: Payer: Self-pay | Admitting: Physician Assistant

## 2015-10-26 ENCOUNTER — Telehealth: Payer: Self-pay | Admitting: *Deleted

## 2015-10-26 DIAGNOSIS — R7989 Other specified abnormal findings of blood chemistry: Secondary | ICD-10-CM

## 2015-10-26 NOTE — Telephone Encounter (Signed)
-----   Message from Brunetta Jeans, PA-C sent at 10/14/2015  9:58 PM EDT ----- Please inform patient that labs look good overall. His testosterone level was slightly low in the low 200s. This can be contributing to his fatigue. Would recommend Urology referral for testosterone replacement therapy if he would like. Also his WBC count was elevated. I feel this was due to recent injection but want to make sure it comes down. We will recheck at his follow-up in a couple of weeks when we reassess his BP as long as he is feeling well. Please assess for any new symptoms.

## 2015-10-27 NOTE — Telephone Encounter (Signed)
Called and spoke with the pt on (10/26/15) regarding recent lab results and note.  Pt stated that he has already spoken to someone regarding his results.  Pt still verbalized understanding.  Referral for Urology was ordered and sent.//AB/CMA

## 2015-10-27 NOTE — Telephone Encounter (Signed)
Requesting Clonazepam 0.5mg -Take 1 tablet by mouth at bedtime. Last refill:07/16/15;#30,2 Last OV:10/13/15 UDS:Not Done Please advise.//AB/CMA

## 2015-11-10 ENCOUNTER — Ambulatory Visit (INDEPENDENT_AMBULATORY_CARE_PROVIDER_SITE_OTHER): Payer: BC Managed Care – PPO | Admitting: Physician Assistant

## 2015-11-10 ENCOUNTER — Encounter: Payer: Self-pay | Admitting: Physician Assistant

## 2015-11-10 VITALS — BP 130/84 | HR 85 | Temp 98.0°F | Resp 16 | Ht 73.0 in | Wt 215.0 lb

## 2015-11-10 DIAGNOSIS — F418 Other specified anxiety disorders: Secondary | ICD-10-CM

## 2015-11-10 DIAGNOSIS — D72829 Elevated white blood cell count, unspecified: Secondary | ICD-10-CM

## 2015-11-10 DIAGNOSIS — F419 Anxiety disorder, unspecified: Principal | ICD-10-CM

## 2015-11-10 DIAGNOSIS — F329 Major depressive disorder, single episode, unspecified: Secondary | ICD-10-CM

## 2015-11-10 NOTE — Patient Instructions (Signed)
Please go to the lab for blood work. I will call you with your results.  Continue your chronic medications as directed. Follow-up in 3 months.

## 2015-11-10 NOTE — Progress Notes (Signed)
Pre visit review using our clinic review tool, if applicable. No additional management support is needed unless otherwise documented below in the visit note/SLS  

## 2015-11-10 NOTE — Progress Notes (Signed)
Patient presents to clinic today for follow-up of anxiety and depression. Is currently on Effexor XR 37.5 mg daily. Endorses taking as directed. Denies side effects of medication. Endorses good mood without anxiety on this medication. Is no longer taking Klonopin and doing just fine.  Past Medical History  Diagnosis Date  . Asthma   . Depression   . GERD (gastroesophageal reflux disease)   . Bulging lumbar disc   . History of chicken pox     Current Outpatient Prescriptions on File Prior to Visit  Medication Sig Dispense Refill  . albuterol (PROAIR HFA) 108 (90 Base) MCG/ACT inhaler Inhale 1-2 puffs into the lungs every 6 (six) hours as needed for wheezing or shortness of breath. 6.7 g 5  . Melatonin 5 MG TABS Take by mouth.    Marland Kitchen omeprazole (PRILOSEC OTC) 20 MG tablet Take 20 mg by mouth daily.    . sulindac (CLINORIL) 200 MG tablet TAKE 1 TABLET BY MOUTH 2 TIMES DAILY AS NEEDED. 60 tablet 1  . venlafaxine XR (EFFEXOR-XR) 37.5 MG 24 hr capsule Take 1 capsule (37.5 mg total) by mouth daily with breakfast. 30 capsule 1  . vitamin E 400 UNIT capsule Take 400 Units by mouth daily.    . beclomethasone (QVAR) 80 MCG/ACT inhaler INHALE 2 PUFFS INTO THE LUNGS 2 (TWO) TIMES DAILY. (Patient not taking: Reported on 10/13/2015) 8.7 g 3   No current facility-administered medications on file prior to visit.    Allergies  Allergen Reactions  . Hydrocodone Itching and Other (See Comments)    Hypersensitivity    Family History  Problem Relation Age of Onset  . Healthy Mother     Living  . Healthy Father     Living  . Thyroid disease Brother   . Asthma Daughter     Social History   Social History  . Marital Status: Married    Spouse Name: N/A  . Number of Children: N/A  . Years of Education: N/A   Social History Main Topics  . Smoking status: Never Smoker   . Smokeless tobacco: Never Used  . Alcohol Use: No  . Drug Use: No  . Sexual Activity: Not Asked   Other Topics Concern   . None   Social History Narrative   He live with wife and youngest daughter.   He works as a [employer] fast streamer.   Highest level of education:  College, BS   Review of Systems - See HPI.  All other ROS are negative.  BP 130/84 mmHg  Pulse 85  Temp(Src) 98 F (36.7 C) (Oral)  Resp 16  Ht 6' 1"  (1.854 m)  Wt 215 lb (97.523 kg)  BMI 28.37 kg/m2  SpO2 98%  Physical Exam  Constitutional: He is oriented to person, place, and time and well-developed, well-nourished, and in no distress.  HENT:  Head: Normocephalic and atraumatic.  Eyes: Conjunctivae are normal.  Cardiovascular: Normal rate, regular rhythm, normal heart sounds and intact distal pulses.   Pulmonary/Chest: Effort normal and breath sounds normal. No respiratory distress. He has no wheezes. He has no rales. He exhibits no tenderness.  Neurological: He is alert and oriented to person, place, and time.  Skin: Skin is warm and dry. No rash noted.  Psychiatric: Affect normal.  Vitals reviewed.   Recent Results (from the past 2160 hour(s))  CBC     Status: Abnormal   Collection Time: 10/13/15  1:56 PM  Result Value Ref Range   WBC 14.5 (H)  4.0 - 10.5 K/uL   RBC 5.23 4.22 - 5.81 Mil/uL   Platelets 354.0 150.0 - 400.0 K/uL   Hemoglobin 15.7 13.0 - 17.0 g/dL   HCT 46.7 39.0 - 52.0 %   MCV 89.3 78.0 - 100.0 fl   MCHC 33.6 30.0 - 36.0 g/dL   RDW 13.1 11.5 - 15.5 %  Comp Met (CMET)     Status: None   Collection Time: 10/13/15  1:56 PM  Result Value Ref Range   Sodium 138 135 - 145 mEq/L   Potassium 4.5 3.5 - 5.1 mEq/L   Chloride 101 96 - 112 mEq/L   CO2 30 19 - 32 mEq/L   Glucose, Bld 95 70 - 99 mg/dL   BUN 16 6 - 23 mg/dL   Creatinine, Ser 0.94 0.40 - 1.50 mg/dL   Total Bilirubin 0.3 0.2 - 1.2 mg/dL   Alkaline Phosphatase 62 39 - 117 U/L   AST 12 0 - 37 U/L   ALT 17 0 - 53 U/L   Total Protein 7.5 6.0 - 8.3 g/dL   Albumin 4.7 3.5 - 5.2 g/dL   Calcium 9.8 8.4 - 10.5 mg/dL   GFR 93.83 >60.00 mL/min  TSH     Status: None    Collection Time: 10/13/15  1:56 PM  Result Value Ref Range   TSH 2.33 0.35 - 4.50 uIU/mL  Testosterone     Status: Abnormal   Collection Time: 10/13/15  1:56 PM  Result Value Ref Range   Testosterone 230.66 (L) 300.00 - 890.00 ng/dL    Assessment/Plan: 1. Leukocytosis Incidental on last labs. Asymptomatic. Patient had steroid injection a few days before labs were drawn. Will repeat today. - CBC  2. Anxiety and depression Dong very well. Will continue Effexor Xr 37.5 mg daily. FU 3 months.

## 2015-11-11 LAB — CBC
HCT: 46.2 % (ref 39.0–52.0)
HEMOGLOBIN: 15.1 g/dL (ref 13.0–17.0)
MCHC: 32.7 g/dL (ref 30.0–36.0)
MCV: 91.1 fl (ref 78.0–100.0)
Platelets: 273 10*3/uL (ref 150.0–400.0)
RBC: 5.07 Mil/uL (ref 4.22–5.81)
RDW: 13.6 % (ref 11.5–15.5)
WBC: 8.4 10*3/uL (ref 4.0–10.5)

## 2015-11-11 NOTE — Progress Notes (Signed)
Quick Note:  Pt has seen results on MyChart and message also sent for patient to call back if any questions. ______ 

## 2015-11-29 ENCOUNTER — Other Ambulatory Visit: Payer: Self-pay | Admitting: Physician Assistant

## 2015-11-30 NOTE — Telephone Encounter (Signed)
Rx request to pharmacy/SLS  

## 2016-01-06 ENCOUNTER — Telehealth: Payer: Self-pay | Admitting: Physician Assistant

## 2016-01-06 NOTE — Telephone Encounter (Signed)
Pt called in to update the pharmacy on file.   Pharmacy : [facility]

## 2016-01-07 NOTE — Telephone Encounter (Signed)
Noted and changed in Demographics/SLS 08/04

## 2016-01-10 ENCOUNTER — Other Ambulatory Visit: Payer: Self-pay | Admitting: Physician Assistant

## 2016-01-10 NOTE — Telephone Encounter (Signed)
Rx request to pharmacy/SLS  

## 2016-01-14 ENCOUNTER — Telehealth: Payer: Self-pay | Admitting: Physician Assistant

## 2016-01-14 ENCOUNTER — Encounter: Payer: Self-pay | Admitting: Physician Assistant

## 2016-01-14 ENCOUNTER — Encounter: Payer: BC Managed Care – PPO | Admitting: Physician Assistant

## 2016-01-14 MED ORDER — CYCLOBENZAPRINE HCL 5 MG PO TABS: 5.0000 mg | ORAL_TABLET | Freq: Three times a day (TID) | ORAL | 1 refills | Status: DC | PRN

## 2016-01-14 NOTE — Progress Notes (Signed)
This encounter was created in error - please disregard.

## 2016-01-14 NOTE — Telephone Encounter (Signed)
I have resent the prescription. Please inform the patient and then call the pharmacy to verify receipt of the medication.

## 2016-01-14 NOTE — Telephone Encounter (Signed)
Relationship to patient: self Can be reached: [phone] [facility]acy: [id number] [place], [place] [address]  Reason for call: [facility] Pt is going out of town today. He said you told him to call if he was not able to fill. Please send in asap. Thank you.

## 2016-01-14 NOTE — Telephone Encounter (Signed)
Called pharmacy and verified receipt of rx. Called pt and left detailed message that rx is available for pick up at pharmacy.

## 2016-02-09 ENCOUNTER — Other Ambulatory Visit: Payer: Self-pay | Admitting: Physician Assistant

## 2016-03-19 ENCOUNTER — Other Ambulatory Visit: Payer: Self-pay | Admitting: Physician Assistant

## 2016-05-08 ENCOUNTER — Other Ambulatory Visit: Payer: Self-pay | Admitting: Physician Assistant

## 2016-05-08 NOTE — Telephone Encounter (Signed)
Left message on VM @ [phone] informing patient to call the office to schedule appt.

## 2016-05-08 NOTE — Telephone Encounter (Signed)
Refill sent per [facility] refill protocol/SLS  Please call patient and schedule F/U appointment with his choice of provider, was due in 02/2016; for future refill authorizations/SLS 12/04

## 2016-06-02 ENCOUNTER — Other Ambulatory Visit: Payer: Self-pay | Admitting: Physician Assistant

## 2016-07-07 ENCOUNTER — Encounter: Payer: Self-pay | Admitting: Physician Assistant

## 2016-07-07 ENCOUNTER — Ambulatory Visit (INDEPENDENT_AMBULATORY_CARE_PROVIDER_SITE_OTHER): Payer: BC Managed Care – PPO | Admitting: Physician Assistant

## 2016-07-07 VITALS — BP 130/82 | HR 76 | Temp 97.8°F | Resp 14 | Ht 73.0 in | Wt 212.0 lb

## 2016-07-07 DIAGNOSIS — F418 Other specified anxiety disorders: Secondary | ICD-10-CM | POA: Diagnosis not present

## 2016-07-07 DIAGNOSIS — F32A Depression, unspecified: Secondary | ICD-10-CM

## 2016-07-07 DIAGNOSIS — F419 Anxiety disorder, unspecified: Secondary | ICD-10-CM

## 2016-07-07 DIAGNOSIS — Z23 Encounter for immunization: Secondary | ICD-10-CM

## 2016-07-07 DIAGNOSIS — F329 Major depressive disorder, single episode, unspecified: Secondary | ICD-10-CM

## 2016-07-07 MED ORDER — TRAZODONE HCL 50 MG PO TABS: 25.0000 mg | ORAL_TABLET | Freq: Every day | ORAL | 3 refills | Status: DC

## 2016-07-07 NOTE — Patient Instructions (Signed)
Please start the new medication as directed, taking 1/2 tablet at night.  Only take when you can devote 7-8 hours to sleep. This should help with sleep and anxiety.   Follow-up with me in 3-4 weeks.  If you note any worsening mood while on medication, please stop and call or come see me.

## 2016-07-07 NOTE — Progress Notes (Signed)
Pre visit review using our clinic review tool, if applicable. No additional management support is needed unless otherwise documented below in the visit note. 

## 2016-07-07 NOTE — Progress Notes (Signed)
   Patient presents to clinic today for follow-up of anxiety/depression. Patient most recently on Effexor XR 37.5 daily. Noted no major change in mood on medication but increase muscle aches. Was told by his Neurologist that this could be a side effect of medication. Patient endorses weaning off of medication since November. Notes continued generalized anxiety and being a "worry wart". Denies panic attack. Denies depressed mood or anhedonia. Endorses decreased sleep -- with difficulty falling and staying asleep. Averaging 5 hours per night.   Past Medical History:  Diagnosis Date  . Asthma   . Bulging lumbar disc   . Depression   . GERD (gastroesophageal reflux disease)   . History of chicken pox     Current Outpatient Prescriptions on File Prior to Visit  Medication Sig Dispense Refill  . cyclobenzaprine (FLEXERIL) 5 MG tablet Take 1 tablet (5 mg total) by mouth 3 (three) times daily as needed for muscle spasms. 90 tablet 1  . Melatonin 5 MG TABS Take by mouth.    . VENTOLIN HFA 108 (90 Base) MCG/ACT inhaler INHALE 1-2 PUFFS INTO LUNGS EVERY 6 HOURS AS NEEDED FOR WHEEZE OR SHORTNESS OF BREATH. 18 Inhaler 0   No current facility-administered medications on file prior to visit.     Allergies  Allergen Reactions  . Hydrocodone Itching and Other (See Comments)    Hypersensitivity    Family History  Problem Relation Age of Onset  . Healthy Mother     Living  . Healthy Father     Living  . Thyroid disease Brother   . Asthma Daughter     Social History   Social History  . Marital status: Married    Spouse name: N/A  . Number of children: N/A  . Years of education: N/A   Social History Main Topics  . Smoking status: Never Smoker  . Smokeless tobacco: Never Used  . Alcohol use No  . Drug use: No  . Sexual activity: Not Asked   Other Topics Concern  . None   Social History Narrative   He live with wife and youngest daughter.   He works as a [employer] fast streamer.   Highest level  of education:  College, BS   Review of Systems - See HPI.  All other ROS are negative.  BP 130/82   Pulse 76   Temp 97.8 F (36.6 C) (Oral)   Resp 14   Ht 6\' 1"  (1.854 m)   Wt 212 lb (96.2 kg)   SpO2 99%   BMI 27.97 kg/m   Physical Exam  Constitutional: He is well-developed, well-nourished, and in no distress.  HENT:  Head: Normocephalic and atraumatic.  Eyes: Conjunctivae are normal.  Neck: Neck supple.  Cardiovascular: Normal rate, regular rhythm, normal heart sounds and intact distal pulses.   Pulmonary/Chest: Effort normal and breath sounds normal. No respiratory distress. He has no wheezes. He has no rales. He exhibits no tenderness.  Neurological: He is alert.  Skin: Skin is warm and dry. No rash noted.  Psychiatric: Affect normal.  Vitals reviewed.  Assessment/Plan: Anxiety and depression Will attempt trial of Trazodone 25 mg to help with sleep and anxiety. FU scheduled for 3-4 weeks.    Leeanne Rio, PA-C

## 2016-07-07 NOTE — Assessment & Plan Note (Signed)
Will attempt trial of Trazodone 25 mg to help with sleep and anxiety. FU scheduled for 3-4 weeks.

## 2016-07-19 ENCOUNTER — Telehealth: Payer: Self-pay | Admitting: Physician Assistant

## 2016-07-20 NOTE — Telephone Encounter (Signed)
Patient missed a call from [place] prescription Sulindac.  Thank you.  -LL

## 2016-07-20 NOTE — Telephone Encounter (Signed)
LMOVM advising patient how often is he having to take the Sulindac and how is his pain level?

## 2016-07-21 NOTE — Telephone Encounter (Signed)
Spoke with patient about how often he takes the Sulindac. When he gets the shot/injection he doesn't need for about 4 weeks after. He gets a shot about every 4 months He does take twice a day when the shot wears off.

## 2016-07-21 NOTE — Telephone Encounter (Signed)
Patient missed a call again from [place] prescription Sulindac.  Thank you.  -LL

## 2016-07-21 NOTE — Telephone Encounter (Signed)
Per Einar Pheasant ok to refill medication. Patient needs to schedule an appointment for physical. He is agreeable and will call back to schedule

## 2016-07-25 NOTE — Telephone Encounter (Signed)
Thank you.  I called the patient and got him scheduled for Wednesday Feb 21st at 10:30 for a physical exam with PA Hassell Done.  -LL

## 2016-07-26 ENCOUNTER — Encounter: Payer: Self-pay | Admitting: Physician Assistant

## 2016-07-26 ENCOUNTER — Ambulatory Visit (INDEPENDENT_AMBULATORY_CARE_PROVIDER_SITE_OTHER): Payer: BC Managed Care – PPO | Admitting: Physician Assistant

## 2016-07-26 VITALS — BP 126/88 | HR 80 | Temp 98.1°F | Resp 14 | Ht 73.0 in | Wt 213.0 lb

## 2016-07-26 DIAGNOSIS — M5440 Lumbago with sciatica, unspecified side: Secondary | ICD-10-CM | POA: Diagnosis not present

## 2016-07-26 DIAGNOSIS — J4540 Moderate persistent asthma, uncomplicated: Secondary | ICD-10-CM

## 2016-07-26 DIAGNOSIS — Z0000 Encounter for general adult medical examination without abnormal findings: Secondary | ICD-10-CM | POA: Diagnosis not present

## 2016-07-26 DIAGNOSIS — M545 Low back pain, unspecified: Secondary | ICD-10-CM

## 2016-07-26 DIAGNOSIS — G4700 Insomnia, unspecified: Secondary | ICD-10-CM | POA: Diagnosis not present

## 2016-07-26 LAB — COMPREHENSIVE METABOLIC PANEL
ALK PHOS: 68 U/L (ref 39–117)
ALT: 18 U/L (ref 0–53)
AST: 15 U/L (ref 0–37)
Albumin: 4.8 g/dL (ref 3.5–5.2)
BUN: 10 mg/dL (ref 6–23)
CO2: 31 mEq/L (ref 19–32)
Calcium: 9.8 mg/dL (ref 8.4–10.5)
Chloride: 103 mEq/L (ref 96–112)
Creatinine, Ser: 0.96 mg/dL (ref 0.40–1.50)
GFR: 91.22 mL/min (ref >60.00)
GLUCOSE: 98 mg/dL (ref 70–99)
POTASSIUM: 4.5 meq/L (ref 3.5–5.1)
Sodium: 139 mEq/L (ref 135–145)
TOTAL PROTEIN: 7 g/dL (ref 6.0–8.3)
Total Bilirubin: 0.4 mg/dL (ref 0.2–1.2)

## 2016-07-26 LAB — URINALYSIS, ROUTINE W REFLEX MICROSCOPIC
Bilirubin Urine: NEGATIVE
Hgb urine dipstick: NEGATIVE
Ketones, ur: NEGATIVE
Leukocytes, UA: NEGATIVE
Nitrite: NEGATIVE
PH: 7 (ref 5.0–8.0)
RBC / HPF: NONE SEEN (ref 0–?)
SPECIFIC GRAVITY, URINE: 1.01 (ref 1.000–1.030)
Total Protein, Urine: NEGATIVE
Urine Glucose: NEGATIVE
Urobilinogen, UA: 0.2 (ref 0.0–1.0)
WBC UA: NONE SEEN (ref 0–?)

## 2016-07-26 LAB — CBC
HEMATOCRIT: 46.6 % (ref 39.0–52.0)
HEMOGLOBIN: 15.6 g/dL (ref 13.0–17.0)
MCHC: 33.6 g/dL (ref 30.0–36.0)
MCV: 89.9 fl (ref 78.0–100.0)
Platelets: 294 10*3/uL (ref 150.0–400.0)
RBC: 5.18 Mil/uL (ref 4.22–5.81)
RDW: 13.4 % (ref 11.5–15.5)
WBC: 7.8 10*3/uL (ref 4.0–10.5)

## 2016-07-26 LAB — LIPID PANEL
CHOLESTEROL: 238 mg/dL — AB (ref 0–200)
HDL: 33.7 mg/dL — AB (ref >39.00)
NonHDL: 204.76
Total CHOL/HDL Ratio: 7
Triglycerides: 216 mg/dL — ABNORMAL HIGH (ref 0.0–149.0)
VLDL: 43.2 mg/dL — ABNORMAL HIGH (ref 0.0–40.0)

## 2016-07-26 LAB — TSH: TSH: 1.54 u[IU]/mL (ref 0.35–4.50)

## 2016-07-26 LAB — LDL CHOLESTEROL, DIRECT: Direct LDL: 173 mg/dL

## 2016-07-26 MED ORDER — MOMETASONE FUROATE 110 MCG/INH IN AEPB: INHALATION_SPRAY | RESPIRATORY_TRACT | 2 refills | Status: DC

## 2016-07-26 NOTE — Assessment & Plan Note (Signed)
Increase Trazodone to 50 mg.  Evening exercise.

## 2016-07-26 NOTE — Assessment & Plan Note (Signed)
Secondary to herniated disc.  Followed by Neurosurgery. Continue injections as scheduled with specialist. Will continue Clinoral intermittently for now.  Patient to give some thought to Gabapentin for pain relief.

## 2016-07-26 NOTE — Patient Instructions (Addendum)
Please go to the lab for blood work.   Our office will call you with your results unless you have chosen to receive results via MyChart.  If your blood work is normal we will follow-up each year for physicals and as scheduled for chronic medical problems.  If anything is abnormal we will treat accordingly and get you in for a follow-up.  Continue medications as directed with the following exception: Increase Trazodone to 1 whole tablet at bedtime for sleep.  I will reach out to a Sports Medicine colleague about the muscle tension you note. Please follow-up with Dr. Ellene Route as scheduled for injections.   Use Asmanex as directed to cut back use of rescue inhaler.   Preventive Care 40-64 Years, Male Preventive care refers to lifestyle choices and visits with your health care provider that can promote health and wellness. What does preventive care include?  A yearly physical exam. This is also called an annual well check.  Dental exams once or twice a year.  Routine eye exams. Ask your health care provider how often you should have your eyes checked.  Personal lifestyle choices, including:  Daily care of your teeth and gums.  Regular physical activity.  Eating a healthy diet.  Avoiding tobacco and drug use.  Limiting alcohol use.  Practicing safe sex.  Taking low-dose aspirin every day starting at age 39. What happens during an annual well check? The services and screenings done by your health care provider during your annual well check will depend on your age, overall health, lifestyle risk factors, and family history of disease. Counseling  Your health care provider may ask you questions about your:  Alcohol use.  Tobacco use.  Drug use.  Emotional well-being.  Home and relationship well-being.  Sexual activity.  Eating habits.  Work and work Statistician. Screening  You may have the following tests or measurements:  Height, weight, and BMI.  Blood  pressure.  Lipid and cholesterol levels. These may be checked every 5 years, or more frequently if you are over 72 years old.  Skin check.  Lung cancer screening. You may have this screening every year starting at age 46 if you have a 30-pack-year history of smoking and currently smoke or have quit within the past 15 years.  Fecal occult blood test (FOBT) of the stool. You may have this test every year starting at age 49.  Flexible sigmoidoscopy or colonoscopy. You may have a sigmoidoscopy every 5 years or a colonoscopy every 10 years starting at age 77.  Prostate cancer screening. Recommendations will vary depending on your family history and other risks.  Hepatitis C blood test.  Hepatitis B blood test.  Sexually transmitted disease (STD) testing.  Diabetes screening. This is done by checking your blood sugar (glucose) after you have not eaten for a while (fasting). You may have this done every 1-3 years. Discuss your test results, treatment options, and if necessary, the need for more tests with your health care provider. Vaccines  Your health care provider may recommend certain vaccines, such as:  Influenza vaccine. This is recommended every year.  Tetanus, diphtheria, and acellular pertussis (Tdap, Td) vaccine. You may need a Td booster every 10 years.  Varicella vaccine. You may need this if you have not been vaccinated.  Zoster vaccine. You may need this after age 63.  Measles, mumps, and rubella (MMR) vaccine. You may need at least one dose of MMR if you were born in 1957 or later. You may  also need a second dose.  Pneumococcal 13-valent conjugate (PCV13) vaccine. You may need this if you have certain conditions and have not been vaccinated.  Pneumococcal polysaccharide (PPSV23) vaccine. You may need one or two doses if you smoke cigarettes or if you have certain conditions.  Meningococcal vaccine. You may need this if you have certain conditions.  Hepatitis A  vaccine. You may need this if you have certain conditions or if you travel or work in places where you may be exposed to hepatitis A.  Hepatitis B vaccine. You may need this if you have certain conditions or if you travel or work in places where you may be exposed to hepatitis B.  Haemophilus influenzae type b (Hib) vaccine. You may need this if you have certain risk factors. Talk to your health care provider about which screenings and vaccines you need and how often you need them. This information is not intended to replace advice given to you by your health care provider. Make sure you discuss any questions you have with your health care provider. Document Released: 06/18/2015 Document Revised: 02/09/2016 Document Reviewed: 03/23/2015 Elsevier Interactive Patient Education  2017 [employer].

## 2016-07-26 NOTE — Progress Notes (Signed)
Pre visit review using our clinic review tool, if applicable. No additional management support is needed unless otherwise documented below in the visit note. 

## 2016-07-26 NOTE — Assessment & Plan Note (Signed)
Depression screen negative. Health Maintenance reviewed -- Declines flu shot. Tetanus up-to-date. Preventive schedule discussed and handout given in AVS. Will obtain fasting labs today.  

## 2016-07-26 NOTE — Progress Notes (Signed)
Patient presents to clinic today for annual exam.  Patient is fasting for labs. Body mass index is 28.1 kg/m. Engages in regular activity.  Chronic Issues: Chronic Low Back Pain -- Followed by Neurosurgery. Recent MRI. He has brought a report for review and to be placed in EMR. Reveals significant disc bulging at L4/5 with some stenosis. Has had 4 injections total for the issue. Now getting less relief with each injection. Bilateral sciatica when present. Takes Clinoril twice daily when injection relief wears off, usually a couple of months after injections. Does not take when pain is not present after injection. Has upcoming injection of a different type per patient.   Asthma -- Mild, intermittent. Only persistent with changing season. Has noted increased use of rescure inhaler over the past month. Taking daily at present. Denies nighttime symptoms or exacerbations  Insomnia -- Is currently on Trazodone 25 mg. Is not noting significant improvement in sleep with this regimen.   Health Maintenance: Immunizations -- Declines flu shot. Tetanus up-to-date.  Past Medical History:  Diagnosis Date  . Asthma   . Bulging lumbar disc   . Depression   . GERD (gastroesophageal reflux disease)   . History of chicken pox     Past Surgical History:  Procedure Laterality Date  . WISDOM TOOTH EXTRACTION      Current Outpatient Prescriptions on File Prior to Visit  Medication Sig Dispense Refill  . cyclobenzaprine (FLEXERIL) 5 MG tablet Take 1 tablet (5 mg total) by mouth 3 (three) times daily as needed for muscle spasms. 90 tablet 1  . Melatonin 5 MG TABS Take by mouth.    . sulindac (CLINORIL) 200 MG tablet TAKE 1 TABLET BY MOUTH TWICE A DAY AS NEEDED 60 tablet 0  . traZODone (DESYREL) 50 MG tablet Take 0.5 tablets (25 mg total) by mouth at bedtime. 30 tablet 3  . VENTOLIN HFA 108 (90 Base) MCG/ACT inhaler INHALE 1-2 PUFFS INTO LUNGS EVERY 6 HOURS AS NEEDED FOR WHEEZE OR SHORTNESS OF BREATH.  18 Inhaler 0   No current facility-administered medications on file prior to visit.     Allergies  Allergen Reactions  . Hydrocodone Itching and Other (See Comments)    Hypersensitivity with whole pill    Family History  Problem Relation Age of Onset  . Healthy Mother     Living  . Healthy Father     Living  . Thyroid disease Brother   . Asthma Daughter     Social History   Social History  . Marital status: Married    Spouse name: N/A  . Number of children: N/A  . Years of education: N/A   Occupational History  . Not on file.   Social History Main Topics  . Smoking status: Never Smoker  . Smokeless tobacco: Never Used  . Alcohol use No  . Drug use: No  . Sexual activity: Yes    Partners: Female   Other Topics Concern  . Not on file   Social History Narrative   He live with wife and youngest daughter.   He works as a [employer] fast streamer.   Highest level of education:  College, BS   Review of Systems  Constitutional: Negative for fever and weight loss.  HENT: Negative for ear discharge, ear pain, hearing loss and tinnitus.   Eyes: Negative for blurred vision, double vision, photophobia and pain.  Respiratory: Negative for cough and shortness of breath.   Cardiovascular: Negative for chest pain and palpitations.  Gastrointestinal: Negative for abdominal pain, blood in stool, constipation, diarrhea, heartburn, melena, nausea and vomiting.  Genitourinary: Negative for dysuria, flank pain, frequency, hematuria and urgency.  Musculoskeletal: Positive for back pain. Negative for falls.       Chronic back pain secondary to herniated disc  Neurological: Negative for dizziness, loss of consciousness and headaches.  Endo/Heme/Allergies: Negative for environmental allergies.  Psychiatric/Behavioral: Negative for depression, hallucinations, substance abuse and suicidal ideas. The patient has insomnia. The patient is not nervous/anxious.    BP 126/88   Pulse 80   Temp 98.1 F  (36.7 C) (Oral)   Resp 14   Ht 6\' 1"  (1.854 m)   Wt 213 lb (96.6 kg)   SpO2 99%   BMI 28.10 kg/m   Physical Exam  Constitutional: He is oriented to person, place, and time and well-developed, well-nourished, and in no distress.  HENT:  Head: Normocephalic and atraumatic.  Right Ear: External ear normal.  Left Ear: External ear normal.  Nose: Nose normal.  Mouth/Throat: Oropharynx is clear and moist. No oropharyngeal exudate.  Eyes: Conjunctivae and EOM are normal. Pupils are equal, round, and reactive to light.  Neck: Neck supple. No thyromegaly present.  Cardiovascular: Normal rate, regular rhythm, normal heart sounds and intact distal pulses.   Pulmonary/Chest: Effort normal and breath sounds normal. No respiratory distress. He has no wheezes. He has no rales. He exhibits no tenderness.  Abdominal: Soft. Bowel sounds are normal. He exhibits no distension and no mass. There is no tenderness. There is no rebound and no guarding.  Genitourinary: Testes/scrotum normal and penis normal. No discharge found.  Lymphadenopathy:    He has no cervical adenopathy.  Neurological: He is alert and oriented to person, place, and time.  Skin: Skin is warm and dry. No rash noted.  Psychiatric: Affect normal.  Vitals reviewed.  Assessment/Plan: Visit for preventive health examination Depression screen negative. Health Maintenance reviewed -- Declines flu shot. Tetanus up-to-date. Preventive schedule discussed and handout given in AVS. Will obtain fasting labs today.     Low back pain with radiation Secondary to herniated disc.  Followed by Neurosurgery. Continue injections as scheduled with specialist. Will continue Clinoral intermittently for now.  Patient to give some thought to Gabapentin for pain relief.  Insomnia Increase Trazodone to 50 mg.  Evening exercise.   Asthma, moderate Start Asmanex twice daily during season change. Supportive measures reviewed. FU if not improving.       Leeanne Rio, PA-C

## 2016-07-26 NOTE — Assessment & Plan Note (Signed)
Start Asmanex twice daily during season change. Supportive measures reviewed. FU if not improving.

## 2016-07-27 ENCOUNTER — Encounter: Payer: Self-pay | Admitting: Physician Assistant

## 2016-08-05 ENCOUNTER — Other Ambulatory Visit: Payer: Self-pay | Admitting: Physician Assistant

## 2016-08-16 ENCOUNTER — Encounter: Payer: Self-pay | Admitting: Physician Assistant

## 2016-08-16 ENCOUNTER — Other Ambulatory Visit: Payer: Self-pay | Admitting: Emergency Medicine

## 2016-08-16 MED ORDER — TRAZODONE HCL 50 MG PO TABS: 50.0000 mg | ORAL_TABLET | Freq: Every day | ORAL | 3 refills | Status: DC

## 2016-08-31 ENCOUNTER — Ambulatory Visit (INDEPENDENT_AMBULATORY_CARE_PROVIDER_SITE_OTHER): Payer: BC Managed Care – PPO | Admitting: Family Medicine

## 2016-08-31 ENCOUNTER — Encounter: Payer: Self-pay | Admitting: Family Medicine

## 2016-08-31 VITALS — BP 110/84 | HR 90 | Temp 98.2°F | Ht 73.0 in | Wt 215.3 lb

## 2016-08-31 DIAGNOSIS — J989 Respiratory disorder, unspecified: Secondary | ICD-10-CM | POA: Diagnosis not present

## 2016-08-31 DIAGNOSIS — J4541 Moderate persistent asthma with (acute) exacerbation: Secondary | ICD-10-CM

## 2016-08-31 DIAGNOSIS — R030 Elevated blood-pressure reading, without diagnosis of hypertension: Secondary | ICD-10-CM

## 2016-08-31 MED ORDER — PREDNISONE 20 MG PO TABS: 40.0000 mg | ORAL_TABLET | Freq: Every day | ORAL | 0 refills | Status: DC

## 2016-08-31 NOTE — Progress Notes (Signed)
HPI:  URI: -started: 3 days ago -symptoms:nasal congestion, sore throat, PND, mild cough, ears feel full, chills occasionally, asthma symptoms- feels wheezy and tight - using albuterol several times daily and this helps -denies:fever, body aches, rash, NVD, tooth pain -has tried: albuterol -sick contacts/travel/risks: no reported flu, strep or tick exposure - wife had upper resp illness a few days ago and went to ucc and was given amoxicillin for "sinus infection" -Hx of: asthma, uses albuterol as needed  ROS: See pertinent positives and negatives per HPI.  Past Medical History:  Diagnosis Date  . Asthma   . Bulging lumbar disc   . Depression   . GERD (gastroesophageal reflux disease)   . History of chicken pox     Past Surgical History:  Procedure Laterality Date  . WISDOM TOOTH EXTRACTION      Family History  Problem Relation Age of Onset  . Healthy Mother     Living  . Healthy Father     Living  . Thyroid disease Brother   . Asthma Daughter     Social History   Social History  . Marital status: Married    Spouse name: N/A  . Number of children: N/A  . Years of education: N/A   Social History Main Topics  . Smoking status: Never Smoker  . Smokeless tobacco: Never Used  . Alcohol use No  . Drug use: No  . Sexual activity: Yes    Partners: Female   Other Topics Concern  . None   Social History Narrative   He live with wife and youngest daughter.   He works as a [employer] fast streamer.   Highest level of education:  College, BS     Current Outpatient Prescriptions:  .  cyclobenzaprine (FLEXERIL) 5 MG tablet, Take 1 tablet (5 mg total) by mouth 3 (three) times daily as needed for muscle spasms., Disp: 90 tablet, Rfl: 1 .  Melatonin 5 MG TABS, Take by mouth., Disp: , Rfl:  .  sulindac (CLINORIL) 200 MG tablet, TAKE 1 TABLET BY MOUTH TWICE A DAY AS NEEDED, Disp: 60 tablet, Rfl: 0 .  traZODone (DESYREL) 50 MG tablet, Take 1 tablet (50 mg total) by mouth at  bedtime., Disp: 30 tablet, Rfl: 3 .  VENTOLIN HFA 108 (90 Base) MCG/ACT inhaler, INHALE 1-2 PUFFS INTO LUNGS EVERY 6 HOURS AS NEEDED FOR WHEEZE OR SHORTNESS OF BREATH., Disp: 18 Inhaler, Rfl: 3 .  predniSONE (DELTASONE) 20 MG tablet, Take 2 tablets (40 mg total) by mouth daily with breakfast., Disp: 8 tablet, Rfl: 0  EXAM:  Vitals:   08/31/16 1655 08/31/16 1732  BP: (!) 122/92 110/84  Pulse: 90   Temp: 98.2 F (36.8 C)     Body mass index is 28.41 kg/m.  GENERAL: vitals reviewed and listed above, alert, oriented, appears well hydrated and in no acute distress  HEENT: atraumatic, conjunttiva clear, no obvious abnormalities on inspection of external nose and ears, normal appearance of ear canals and TMs, clear nasal congestion, mild post oropharyngeal erythema with PND, no tonsillar edema or exudate, no sinus TTP  NECK: no obvious masses on inspection  LUNGS: clear to auscultation bilaterally, no wheezes, rales or rhonchi, good air movement  CV: HRRR, no peripheral edema  MS: moves all extremities without noticeable abnormality  PSYCH: pleasant and cooperative, no obvious depression or anxiety  ASSESSMENT AND PLAN:  Discussed the following assessment and plan:  Respiratory illness  Moderate persistent asthma with acute exacerbation  Elevated blood pressure  reading  -given HPI and exam findings today, a serious infection or illness is unlikely. We discussed potential etiologies, with VURI w. Asthma exacerbation being most likely. We discussed treatment side effects, likely course, antibiotic misuse, transmission, and signs of developing a serious illness. Opted for symptomatic care for upper resp symptoms and prednisone. -will have assistant recheck BP before he leaves after sitting and advise PCP follow up when feeling better in a few weeks follow if remains elevated -of course, we advised to return or notify a doctor immediately if symptoms worsen or persist or new concerns  arise.    Patient Instructions  Recheck BP before leaving - follow up with PCP in 2 weeks if remains elevated  Start the prednisone and take 40mg  daily   Afrin nasal spray twice daily for 3-4 days. Do not use longer.  Tylenol or aleve as needed per instructions.  Use your other asthma medications as instructed.  I hope you are feeling better soon! Seek care immediately if worsening, new concerns or you are not improving with treatment.    Upper Respiratory Infection, Adult An upper respiratory infection (URI) is also known as the common cold. It is often caused by a type of germ (virus). Colds are easily spread (contagious). You can pass it to others by kissing, coughing, sneezing, or drinking out of the same glass. Usually, you get better in 1 to 3  weeks.  However, the cough can last for even longer. HOME CARE   Only take medicine as told by your doctor. Follow instructions provided above.  Drink enough water and fluids to keep your pee (urine) clear or pale yellow.  Get plenty of rest.  Return to work when your temperature is < 100 for 24 hours or as told by your doctor. You may use a face mask and wash your hands to stop your cold from spreading. GET HELP RIGHT AWAY IF:   After the first few days, you feel you are getting worse.  You have questions about your medicine.  You have shortness of breath, or red spit (mucus).  You have pain in the face for more then 3 days, especially when you bend forward.  You have a fever, puffy (swollen) neck, pain when you swallow, or white spots in the back of your throat.  You have a bad headache, ear pain, sinus pain, or chest pain.  You have a high-pitched whistling sound when you breathe in and out (wheezing).  You cough up blood.  You have sore muscles or a stiff neck. MAKE SURE YOU:   Understand these instructions.  Will watch your condition.  Will get help right away if you are not doing well or get worse. Document  Released: 11/08/2007 Document Revised: 08/14/2011 Document Reviewed: 08/27/2013 [facility] Patient Information 2015 [place], [place]. This information is not intended to replace advice given to you by your health care provider. Make sure you discuss any questions you have with your health care provider.    Colin Benton R., DO

## 2016-08-31 NOTE — Patient Instructions (Addendum)
Recheck BP before leaving - follow up with PCP in 2 weeks if remains elevated  Start the prednisone and take 40mg  daily   Afrin nasal spray twice daily for 3-4 days. Do not use longer.  Tylenol or aleve as needed per instructions.  Use your other asthma medications as instructed.  I hope you are feeling better soon! Seek care immediately if worsening, new concerns or you are not improving with treatment.    Upper Respiratory Infection, Adult An upper respiratory infection (URI) is also known as the common cold. It is often caused by a type of germ (virus). Colds are easily spread (contagious). You can pass it to others by kissing, coughing, sneezing, or drinking out of the same glass. Usually, you get better in 1 to 3  weeks.  However, the cough can last for even longer. HOME CARE   Only take medicine as told by your doctor. Follow instructions provided above.  Drink enough water and fluids to keep your pee (urine) clear or pale yellow.  Get plenty of rest.  Return to work when your temperature is < 100 for 24 hours or as told by your doctor. You may use a face mask and wash your hands to stop your cold from spreading. GET HELP RIGHT AWAY IF:   After the first few days, you feel you are getting worse.  You have questions about your medicine.  You have shortness of breath, or red spit (mucus).  You have pain in the face for more then 3 days, especially when you bend forward.  You have a fever, puffy (swollen) neck, pain when you swallow, or white spots in the back of your throat.  You have a bad headache, ear pain, sinus pain, or chest pain.  You have a high-pitched whistling sound when you breathe in and out (wheezing).  You cough up blood.  You have sore muscles or a stiff neck. MAKE SURE YOU:   Understand these instructions.  Will watch your condition.  Will get help right away if you are not doing well or get worse. Document Released: 11/08/2007 Document  Revised: 08/14/2011 Document Reviewed: 08/27/2013 [facility] Patient Information 2015 [place], [place]. This information is not intended to replace advice given to you by your health care provider. Make sure you discuss any questions you have with your health care provider.

## 2016-08-31 NOTE — Progress Notes (Signed)
Pre visit review using our clinic review tool, if applicable. No additional management support is needed unless otherwise documented below in the visit note. 

## 2016-09-09 ENCOUNTER — Other Ambulatory Visit: Payer: Self-pay | Admitting: Physician Assistant

## 2016-11-15 ENCOUNTER — Encounter: Payer: Self-pay | Admitting: Physician Assistant

## 2016-11-15 ENCOUNTER — Ambulatory Visit (INDEPENDENT_AMBULATORY_CARE_PROVIDER_SITE_OTHER): Payer: BC Managed Care – PPO | Admitting: Physician Assistant

## 2016-11-15 VITALS — BP 120/86 | HR 82 | Temp 98.2°F | Resp 14 | Ht 73.0 in | Wt 208.0 lb

## 2016-11-15 DIAGNOSIS — F329 Major depressive disorder, single episode, unspecified: Secondary | ICD-10-CM | POA: Diagnosis not present

## 2016-11-15 DIAGNOSIS — G4700 Insomnia, unspecified: Secondary | ICD-10-CM

## 2016-11-15 DIAGNOSIS — F419 Anxiety disorder, unspecified: Secondary | ICD-10-CM | POA: Diagnosis not present

## 2016-11-15 DIAGNOSIS — R5383 Other fatigue: Secondary | ICD-10-CM | POA: Diagnosis not present

## 2016-11-15 LAB — VITAMIN B12: Vitamin B-12: 113 pg/mL — ABNORMAL LOW (ref 211–911)

## 2016-11-15 LAB — VITAMIN D 25 HYDROXY (VIT D DEFICIENCY, FRACTURES): VITD: 17.29 ng/mL — AB (ref 30.00–100.00)

## 2016-11-15 NOTE — Assessment & Plan Note (Signed)
Doing very well. Continue current regimen.  

## 2016-11-15 NOTE — Assessment & Plan Note (Signed)
With history of low testosterone. Repeat labs today to include testosterone panel, Vitamin D level and B12 level.

## 2016-11-15 NOTE — Patient Instructions (Signed)
Please go to the lab for blood work. I will call you with your results.  Please continue chronic medications as directed. Keep up with diet and exercise.  Follow-up will be based on results.

## 2016-11-15 NOTE — Assessment & Plan Note (Signed)
Doing very well. Continue current regimen. Follow-up 6 months. 

## 2016-11-15 NOTE — Progress Notes (Signed)
Patient presents to clinic today for follow-up of hyperlipidemia. Patient is working hard on lifestyle changes. Has started a ketogenic diet over the past several months. Has also started bicycling about 12-14 miles per week. Has lost 5 pounds so far and gained muscle.   Patient also with history of low testosterone level. Has noted morning fatigue over the past several months. Is followed by Urology. Not currently on any medication for low testosterone levels.   In regards to anxiety and insomnia, patient is currently on a regimen of Trazodone nightly. Has increased to 50 mg nightly. Is doing very well with this regimen. Notes feeling more mellow and not worrying excessively. Is sleeping well at night without daytime somnolence.   Past Medical History:  Diagnosis Date  . Asthma   . Bulging lumbar disc   . Depression   . GERD (gastroesophageal reflux disease)   . History of chicken pox     Current Outpatient Prescriptions on File Prior to Visit  Medication Sig Dispense Refill  . cyclobenzaprine (FLEXERIL) 5 MG tablet Take 1 tablet (5 mg total) by mouth 3 (three) times daily as needed for muscle spasms. 90 tablet 1  . Melatonin 5 MG TABS Take by mouth.    . sulindac (CLINORIL) 200 MG tablet TAKE 1 TABLET BY MOUTH TWICE A DAY AS NEEDED 60 tablet 1  . traZODone (DESYREL) 50 MG tablet Take 1 tablet (50 mg total) by mouth at bedtime. 30 tablet 3  . VENTOLIN HFA 108 (90 Base) MCG/ACT inhaler INHALE 1-2 PUFFS INTO LUNGS EVERY 6 HOURS AS NEEDED FOR WHEEZE OR SHORTNESS OF BREATH. 18 Inhaler 3   No current facility-administered medications on file prior to visit.     Allergies  Allergen Reactions  . Hydrocodone Itching and Other (See Comments)    Hypersensitivity with whole pill    Family History  Problem Relation Age of Onset  . Healthy Mother        Living  . Healthy Father        Living  . Thyroid disease Brother   . Asthma Daughter     Social History   Social History  .  Marital status: Married    Spouse name: N/A  . Number of children: N/A  . Years of education: N/A   Social History Main Topics  . Smoking status: Never Smoker  . Smokeless tobacco: Never Used  . Alcohol use No  . Drug use: No  . Sexual activity: Yes    Partners: Female   Other Topics Concern  . None   Social History Narrative   He live with wife and youngest daughter.   He works as a [employer] fast streamer.   Highest level of education:  College, BS   Review of Systems - See HPI.  All other ROS are negative.  BP 120/86   Pulse 82   Temp 98.2 F (36.8 C) (Oral)   Resp 14   Ht 6\' 1"  (1.854 m)   Wt 208 lb (94.3 kg)   SpO2 98%   BMI 27.44 kg/m   Physical Exam  Constitutional: He is oriented to person, place, and time and well-developed, well-nourished, and in no distress.  HENT:  Head: Normocephalic and atraumatic.  Eyes: Conjunctivae are normal.  Cardiovascular: Normal rate, regular rhythm, normal heart sounds and intact distal pulses.   Pulmonary/Chest: Effort normal and breath sounds normal. No respiratory distress. He has no wheezes. He has no rales. He exhibits no tenderness.  Neurological: He is  alert and oriented to person, place, and time.  Skin: Skin is warm and dry. No rash noted.  Psychiatric: Affect normal.  Vitals reviewed.  Assessment/Plan: Insomnia Doing very well. Continue current regimen.   Anxiety and depression Doing very well. Continue current regimen. Follow-up 6 months.  Fatigue With history of low testosterone. Repeat labs today to include testosterone panel, Vitamin D level and B12 level.    Leeanne Rio, PA-C

## 2016-11-15 NOTE — Progress Notes (Signed)
Pre visit review using our clinic review tool, if applicable. No additional management support is needed unless otherwise documented below in the visit note. 

## 2016-11-16 ENCOUNTER — Other Ambulatory Visit: Payer: Self-pay | Admitting: Physician Assistant

## 2016-11-16 ENCOUNTER — Encounter: Payer: Self-pay | Admitting: Physician Assistant

## 2016-11-16 DIAGNOSIS — E559 Vitamin D deficiency, unspecified: Secondary | ICD-10-CM

## 2016-11-16 DIAGNOSIS — E538 Deficiency of other specified B group vitamins: Secondary | ICD-10-CM

## 2016-11-16 LAB — TESTOSTERONE TOTAL,FREE,BIO, MALES
ALBUMIN: 4.4 g/dL (ref 3.6–5.1)
SEX HORMONE BINDING: 31 nmol/L (ref 10–50)
TESTOSTERONE BIOAVAILABLE: 134.2 ng/dL (ref 110.0–575.0)
Testosterone, Free: 66.7 pg/mL (ref 46.0–224.0)
Testosterone: 470 ng/dL (ref 250–827)

## 2016-11-16 MED ORDER — CYANOCOBALAMIN 500 MCG PO TABS: 500.0000 ug | ORAL_TABLET | Freq: Every day | ORAL | 0 refills | Status: DC

## 2016-11-16 MED ORDER — VITAMIN D (ERGOCALCIFEROL) 1.25 MG (50000 UNIT) PO CAPS: 50000.0000 [IU] | ORAL_CAPSULE | ORAL | 0 refills | Status: DC

## 2016-11-17 NOTE — Progress Notes (Signed)
Called pt and lmovm to return call and that he can view labs on mychart.

## 2016-11-29 ENCOUNTER — Other Ambulatory Visit: Payer: Self-pay | Admitting: Emergency Medicine

## 2016-11-29 MED ORDER — ALBUTEROL SULFATE HFA 108 (90 BASE) MCG/ACT IN AERS: INHALATION_SPRAY | RESPIRATORY_TRACT | 3 refills | Status: DC

## 2016-12-04 ENCOUNTER — Telehealth: Payer: Self-pay | Admitting: *Deleted

## 2016-12-04 NOTE — Telephone Encounter (Signed)
PA Started through Cover My Meds  KEY: [id number]  Left detailed message for patient letting him know that it has been submitted to insurance.   Will update once I hear back from insurance company

## 2016-12-05 MED ORDER — ALBUTEROL SULFATE HFA 108 (90 BASE) MCG/ACT IN AERS: 2.0000 | INHALATION_SPRAY | Freq: Four times a day (QID) | RESPIRATORY_TRACT | 3 refills | Status: DC | PRN

## 2016-12-05 NOTE — Telephone Encounter (Signed)
Switch to Proair -- same medication but different brand.

## 2016-12-05 NOTE — Telephone Encounter (Signed)
Medication called into pharmacy.  Still waiting for patient to call back to discuss, but I have called in medication as I know that patient is going out of town soon.

## 2016-12-05 NOTE — Telephone Encounter (Signed)
Received denial from insurance regarding inhaler.    Insurance states that patient should have tried/failed ALL alternatives before this medication can be approved.  Left message for patient to call to discuss  Routed to provider to advise on which medication could be sent.

## 2016-12-07 NOTE — Telephone Encounter (Signed)
Patient is aware and he will pick up the new RX.

## 2016-12-11 ENCOUNTER — Other Ambulatory Visit: Payer: Self-pay | Admitting: Physician Assistant

## 2017-01-02 ENCOUNTER — Ambulatory Visit (INDEPENDENT_AMBULATORY_CARE_PROVIDER_SITE_OTHER): Payer: BC Managed Care – PPO | Admitting: Physician Assistant

## 2017-01-02 ENCOUNTER — Encounter: Payer: Self-pay | Admitting: Physician Assistant

## 2017-01-02 VITALS — BP 124/84 | HR 75 | Temp 98.2°F | Resp 14 | Ht 73.0 in | Wt 216.0 lb

## 2017-01-02 DIAGNOSIS — B9689 Other specified bacterial agents as the cause of diseases classified elsewhere: Secondary | ICD-10-CM

## 2017-01-02 DIAGNOSIS — J019 Acute sinusitis, unspecified: Secondary | ICD-10-CM

## 2017-01-02 MED ORDER — METHYLPREDNISOLONE 4 MG PO TBPK: ORAL_TABLET | ORAL | 0 refills | Status: DC

## 2017-01-02 MED ORDER — DOXYCYCLINE HYCLATE 50 MG PO CAPS: 50.0000 mg | ORAL_CAPSULE | Freq: Two times a day (BID) | ORAL | 0 refills | Status: DC

## 2017-01-02 NOTE — Progress Notes (Signed)
Patient presents to clinic today c/o 2-3 weeks of sinus pressure, sinus pain, ear pain and headache. Was seen at Urgent Care 2 weeks ago and given Augmentin and Flonase to use as directed. Endorses taking as directed with only some improvement. Denies fever, chills. Notes continued swollen glands of neck. Denies new or worsening symptoms.   Past Medical History:  Diagnosis Date  . Asthma   . Bulging lumbar disc   . Depression   . GERD (gastroesophageal reflux disease)   . History of chicken pox     Current Outpatient Prescriptions on File Prior to Visit  Medication Sig Dispense Refill  . albuterol (PROVENTIL HFA;VENTOLIN HFA) 108 (90 Base) MCG/ACT inhaler Inhale 2 puffs into the lungs every 6 (six) hours as needed for wheezing or shortness of breath. 1 Inhaler 3  . cyanocobalamin 500 MCG tablet Take 1 tablet (500 mcg total) by mouth daily. 30 tablet 0  . cyclobenzaprine (FLEXERIL) 5 MG tablet TAKE 1 TABLET (5 MG TOTAL) BY MOUTH 3 (THREE) TIMES DAILY AS NEEDED FOR MUSCLE SPASMS. 90 tablet 1  . Melatonin 5 MG TABS Take by mouth.    . sulindac (CLINORIL) 200 MG tablet TAKE 1 TABLET BY MOUTH TWICE A DAY AS NEEDED 60 tablet 1  . traZODone (DESYREL) 50 MG tablet Take 1 tablet (50 mg total) by mouth at bedtime. 30 tablet 3  . Vitamin D, Ergocalciferol, (DRISDOL) 50000 units CAPS capsule Take 1 capsule (50,000 Units total) by mouth every 7 (seven) days. 12 capsule 0   No current facility-administered medications on file prior to visit.     Allergies  Allergen Reactions  . Hydrocodone Itching and Other (See Comments)    Hypersensitivity with whole pill    Family History  Problem Relation Age of Onset  . Healthy Mother        Living  . Healthy Father        Living  . Thyroid disease Brother   . Asthma Daughter     Social History   Social History  . Marital status: Married    Spouse name: N/A  . Number of children: N/A  . Years of education: N/A   Social History Main Topics    . Smoking status: Never Smoker  . Smokeless tobacco: Never Used  . Alcohol use No  . Drug use: No  . Sexual activity: Yes    Partners: Female   Other Topics Concern  . None   Social History Narrative   He live with wife and youngest daughter.   He works as a [employer] fast streamer.   Highest level of education:  College, BS   Review of Systems - See HPI.  All other ROS are negative.  BP 124/84   Pulse 75   Temp 98.2 F (36.8 C) (Oral)   Resp 14   Ht 6\' 1"  (1.854 m)   Wt 216 lb (98 kg)   SpO2 97%   BMI 28.50 kg/m   Physical Exam  Constitutional: He is oriented to person, place, and time and well-developed, well-nourished, and in no distress.  HENT:  Head: Normocephalic and atraumatic.  Right Ear: Tympanic membrane normal.  Left Ear: Tympanic membrane normal.  Nose: Mucosal edema and rhinorrhea present. No septal deviation. Right sinus exhibits frontal sinus tenderness. Left sinus exhibits frontal sinus tenderness.  Mouth/Throat: Uvula is midline, oropharynx is clear and moist and mucous membranes are normal.  Eyes: Conjunctivae are normal.  Neck: Neck supple.  Cardiovascular: Normal rate, regular rhythm,  normal heart sounds and intact distal pulses.   Pulmonary/Chest: Effort normal and breath sounds normal. No respiratory distress. He has no wheezes. He has no rales. He exhibits no tenderness.  Lymphadenopathy:    He has no cervical adenopathy.  Neurological: He is alert and oriented to person, place, and time.  Skin: Skin is warm and dry. No rash noted.  Psychiatric: Affect normal.  Vitals reviewed.   Recent Results (from the past 2160 hour(s))  Testosterone Total,Free,Bio, Males     Status: None   Collection Time: 11/15/16  9:15 AM  Result Value Ref Range   Testosterone 470 250 - 827 ng/dL   Albumin 4.4 3.6 - 5.1 g/dL   Sex Hormone Binding 31 10 - 50 nmol/L   Testosterone, Free 66.7 46.0 - 224.0 pg/mL   Testosterone, Bioavailable 134.2 110.0 - 575.0 ng/dL  B12      Status: Abnormal   Collection Time: 11/15/16  9:15 AM  Result Value Ref Range   Vitamin B-12 113 (L) 211 - 911 pg/mL  Vitamin D (25 hydroxy)     Status: Abnormal   Collection Time: 11/15/16  9:15 AM  Result Value Ref Range   VITD 17.29 (L) 30.00 - 100.00 ng/mL    Assessment/Plan: 1. Acute bacterial sinusitis Rx doxycycline.  Increase fluids.  Rest.  Saline nasal spray.  Probiotic.  Mucinex as directed.  Humidifier in bedroom. Medrol pack. Start Claritin daily.  Call or return to clinic if symptoms are not improving.  - doxycycline (VIBRAMYCIN) 50 MG capsule; Take 1 capsule (50 mg total) by mouth 2 (two) times daily.  Dispense: 20 capsule; Refill: 0 - methylPREDNISolone (MEDROL DOSEPAK) 4 MG TBPK tablet; Take following package directions  Dispense: 21 tablet; Refill: 0   Leeanne Rio, [place]

## 2017-01-02 NOTE — Progress Notes (Signed)
Pre visit review using our clinic review tool, if applicable. No additional management support is needed unless otherwise documented below in the visit note. 

## 2017-01-02 NOTE — Patient Instructions (Signed)
Please take antibiotic as directed.  Increase fluid intake.  Use Saline nasal spray.  Take a daily multivitamin. Continue Flonase. Start the steroid taper as directed. Start a daily Claritin.  Place a humidifier in the bedroom.  Please call or return clinic if symptoms are not improving.  Sinusitis Sinusitis is redness, soreness, and swelling (inflammation) of the paranasal sinuses. Paranasal sinuses are air pockets within the bones of your face (beneath the eyes, the middle of the forehead, or above the eyes). In healthy paranasal sinuses, mucus is able to drain out, and air is able to circulate through them by way of your nose. However, when your paranasal sinuses are inflamed, mucus and air can become trapped. This can allow bacteria and other germs to grow and cause infection. Sinusitis can develop quickly and last only a short time (acute) or continue over a long period (chronic). Sinusitis that lasts for more than 12 weeks is considered chronic.  CAUSES  Causes of sinusitis include:  Allergies.  Structural abnormalities, such as displacement of the cartilage that separates your nostrils (deviated septum), which can decrease the air flow through your nose and sinuses and affect sinus drainage.  Functional abnormalities, such as when the small hairs (cilia) that line your sinuses and help remove mucus do not work properly or are not present. SYMPTOMS  Symptoms of acute and chronic sinusitis are the same. The primary symptoms are pain and pressure around the affected sinuses. Other symptoms include:  Upper toothache.  Earache.  Headache.  Bad breath.  Decreased sense of smell and taste.  A cough, which worsens when you are lying flat.  Fatigue.  Fever.  Thick drainage from your nose, which often is green and may contain pus (purulent).  Swelling and warmth over the affected sinuses. DIAGNOSIS  Your caregiver will perform a physical exam. During the exam, your caregiver  may:  Look in your nose for signs of abnormal growths in your nostrils (nasal polyps).  Tap over the affected sinus to check for signs of infection.  View the inside of your sinuses (endoscopy) with a special imaging device with a light attached (endoscope), which is inserted into your sinuses. If your caregiver suspects that you have chronic sinusitis, one or more of the following tests may be recommended:  Allergy tests.  Nasal culture A sample of mucus is taken from your nose and sent to a lab and screened for bacteria.  Nasal cytology A sample of mucus is taken from your nose and examined by your caregiver to determine if your sinusitis is related to an allergy. TREATMENT  Most cases of acute sinusitis are related to a viral infection and will resolve on their own within 10 days. Sometimes medicines are prescribed to help relieve symptoms (pain medicine, decongestants, nasal steroid sprays, or saline sprays).  However, for sinusitis related to a bacterial infection, your caregiver will prescribe antibiotic medicines. These are medicines that will help kill the bacteria causing the infection.  Rarely, sinusitis is caused by a fungal infection. In theses cases, your caregiver will prescribe antifungal medicine. For some cases of chronic sinusitis, surgery is needed. Generally, these are cases in which sinusitis recurs more than 3 times per year, despite other treatments. HOME CARE INSTRUCTIONS   Drink plenty of water. Water helps thin the mucus so your sinuses can drain more easily.  Use a humidifier.  Inhale steam 3 to 4 times a day (for example, sit in the bathroom with the shower running).  Apply a  warm, moist washcloth to your face 3 to 4 times a day, or as directed by your caregiver.  Use saline nasal sprays to help moisten and clean your sinuses.  Take over-the-counter or prescription medicines for pain, discomfort, or fever only as directed by your caregiver. SEEK IMMEDIATE  MEDICAL CARE IF:  You have increasing pain or severe headaches.  You have nausea, vomiting, or drowsiness.  You have swelling around your face.  You have vision problems.  You have a stiff neck.  You have difficulty breathing. MAKE SURE YOU:   Understand these instructions.  Will watch your condition.  Will get help right away if you are not doing well or get worse. Document Released: 05/22/2005 Document Revised: 08/14/2011 Document Reviewed: 06/06/2011 [facility] Patient Information 2014 [place], [place].

## 2017-01-13 ENCOUNTER — Other Ambulatory Visit: Payer: Self-pay | Admitting: Physician Assistant

## 2017-01-23 ENCOUNTER — Ambulatory Visit (INDEPENDENT_AMBULATORY_CARE_PROVIDER_SITE_OTHER): Payer: BC Managed Care – PPO | Admitting: Physician Assistant

## 2017-01-23 ENCOUNTER — Encounter: Payer: Self-pay | Admitting: Physician Assistant

## 2017-01-23 VITALS — BP 122/86 | HR 96 | Temp 98.8°F | Resp 14 | Ht 73.0 in | Wt 213.0 lb

## 2017-01-23 DIAGNOSIS — K529 Noninfective gastroenteritis and colitis, unspecified: Secondary | ICD-10-CM | POA: Diagnosis not present

## 2017-01-23 LAB — CBC WITH DIFFERENTIAL/PLATELET
BASOS ABS: 0 10*3/uL (ref 0.0–0.1)
Basophils Relative: 0.3 % (ref 0.0–3.0)
EOS PCT: 0.6 % (ref 0.0–5.0)
Eosinophils Absolute: 0.1 10*3/uL (ref 0.0–0.7)
HCT: 44.9 % (ref 39.0–52.0)
Hemoglobin: 14.9 g/dL (ref 13.0–17.0)
LYMPHS ABS: 1.6 10*3/uL (ref 0.7–4.0)
Lymphocytes Relative: 13.4 % (ref 12.0–46.0)
MCHC: 33.1 g/dL (ref 30.0–36.0)
MCV: 92.1 fl (ref 78.0–100.0)
MONO ABS: 1.4 10*3/uL — AB (ref 0.1–1.0)
Monocytes Relative: 11.5 % (ref 3.0–12.0)
NEUTROS PCT: 74.2 % (ref 43.0–77.0)
Neutro Abs: 8.8 10*3/uL — ABNORMAL HIGH (ref 1.4–7.7)
Platelets: 236 10*3/uL (ref 150.0–400.0)
RBC: 4.87 Mil/uL (ref 4.22–5.81)
RDW: 13.4 % (ref 11.5–15.5)
WBC: 11.9 10*3/uL — AB (ref 4.0–10.5)

## 2017-01-23 LAB — COMPREHENSIVE METABOLIC PANEL
ALT: 22 U/L (ref 0–53)
AST: 17 U/L (ref 0–37)
Albumin: 4.1 g/dL (ref 3.5–5.2)
Alkaline Phosphatase: 76 U/L (ref 39–117)
BILIRUBIN TOTAL: 0.6 mg/dL (ref 0.2–1.2)
BUN: 9 mg/dL (ref 6–23)
CALCIUM: 9.5 mg/dL (ref 8.4–10.5)
CHLORIDE: 100 meq/L (ref 96–112)
CO2: 33 meq/L — AB (ref 19–32)
CREATININE: 1.02 mg/dL (ref 0.40–1.50)
GFR: 84.86 mL/min (ref >60.00)
GLUCOSE: 84 mg/dL (ref 70–99)
Potassium: 4.2 mEq/L (ref 3.5–5.1)
SODIUM: 140 meq/L (ref 135–145)
TOTAL PROTEIN: 7.3 g/dL (ref 6.0–8.3)

## 2017-01-23 MED ORDER — ONDANSETRON HCL 4 MG PO TABS: 4.0000 mg | ORAL_TABLET | Freq: Three times a day (TID) | ORAL | 0 refills | Status: DC | PRN

## 2017-01-23 MED ORDER — FLUTICASONE PROPIONATE 50 MCG/ACT NA SUSP: 2.0000 | Freq: Every day | NASAL | 6 refills | Status: DC

## 2017-01-23 NOTE — Patient Instructions (Addendum)
Please go to the lab for blood work. I will call you with your results.  Please stay hydrated and follow the diet below. Start the Zofran as directed when needed for nausea.  Continue tylenol sinus for fever and sinus pressure.  Start the Flonase nasal spray as directed.   We will alter regimen according to results.   I feel the fever is related to the gastroenteritis which is likely viral. Viral fevers tend to present and night and hide during the day. I am hesitant to start antibiotics yet for sinuses due to concern it will worsen GI symptoms.   Again we will alter regimen according to results.

## 2017-01-23 NOTE — Progress Notes (Signed)
Pre visit review using our clinic review tool, if applicable. No additional management support is needed unless otherwise documented below in the visit note. 

## 2017-01-23 NOTE — Progress Notes (Signed)
Patient presents to clinic today c/o 3 days of nighttime fever associated with abdominal cramping, vomiting and nausea. States the cramping and vomiting were present the first day only and have resolved. Denies diarrhea but has had some continued nausea. Denies change to urination. Denies hematochezia or melena. Wife has had similar symptoms. Tmax 100.0 and alleviated with tylenol. Is resolved during day without medication. Patient states he feels somewhat better but has had some sinus pressure and nasal congestion x 1 day. Was recently treated with Doxycycline for a sinus infection with resolution of symptoms.   Past Medical History:  Diagnosis Date  . Asthma   . Bulging lumbar disc   . Depression   . GERD (gastroesophageal reflux disease)   . History of chicken pox     Current Outpatient Prescriptions on File Prior to Visit  Medication Sig Dispense Refill  . albuterol (PROVENTIL HFA;VENTOLIN HFA) 108 (90 Base) MCG/ACT inhaler Inhale 2 puffs into the lungs every 6 (six) hours as needed for wheezing or shortness of breath. 1 Inhaler 3  . cyanocobalamin 500 MCG tablet Take 1 tablet (500 mcg total) by mouth daily. 30 tablet 0  . cyclobenzaprine (FLEXERIL) 5 MG tablet TAKE 1 TABLET (5 MG TOTAL) BY MOUTH 3 (THREE) TIMES DAILY AS NEEDED FOR MUSCLE SPASMS. 90 tablet 1  . Melatonin 5 MG TABS Take by mouth.    . sulindac (CLINORIL) 200 MG tablet TAKE 1 TABLET BY MOUTH TWICE A DAY AS NEEDED 60 tablet 1  . traZODone (DESYREL) 50 MG tablet TAKE 1 TABLET (50 MG TOTAL) BY MOUTH AT BEDTIME. 30 tablet 3  . Vitamin D, Ergocalciferol, (DRISDOL) 50000 units CAPS capsule Take 1 capsule (50,000 Units total) by mouth every 7 (seven) days. 12 capsule 0   No current facility-administered medications on file prior to visit.     Allergies  Allergen Reactions  . Hydrocodone Itching and Other (See Comments)    Hypersensitivity with whole pill    Family History  Problem Relation Age of Onset  . Healthy  Mother        Living  . Healthy Father        Living  . Thyroid disease Brother   . Asthma Daughter     Social History   Social History  . Marital status: Married    Spouse name: N/A  . Number of children: N/A  . Years of education: N/A   Social History Main Topics  . Smoking status: Never Smoker  . Smokeless tobacco: Never Used  . Alcohol use No  . Drug use: No  . Sexual activity: Yes    Partners: Female   Other Topics Concern  . None   Social History Narrative   He live with wife and youngest daughter.   He works as a [employer] fast streamer.   Highest level of education:  College, BS    Review of Systems - See HPI.  All other ROS are negative.  BP 122/86   Pulse 96   Temp 98.8 F (37.1 C) (Oral)   Resp 14   Ht _0  (1.854 m)   Wt 213 lb (96.6 kg)   SpO2 97%   BMI 28.10 kg/m   Physical Exam  Constitutional: He is oriented to person, place, and time and well-developed, well-nourished, and in no distress.  HENT:  Head: Normocephalic and atraumatic.  Right Ear: Tympanic membrane normal.  Left Ear: Tympanic membrane normal.  Nose: Nose normal. Right sinus exhibits no maxillary sinus tenderness and  no frontal sinus tenderness. Left sinus exhibits no maxillary sinus tenderness and no frontal sinus tenderness.  Mouth/Throat: Uvula is midline, oropharynx is clear and moist and mucous membranes are normal.  Eyes: Conjunctivae are normal.  Neck: Neck supple.  Cardiovascular: Normal rate, regular rhythm, normal heart sounds and intact distal pulses.   Pulmonary/Chest: Effort normal and breath sounds normal. No respiratory distress. He has no wheezes. He has no rales. He exhibits no tenderness.  Abdominal: Bowel sounds are hyperactive.  Lymphadenopathy:    He has no cervical adenopathy.  Neurological: He is alert and oriented to person, place, and time.  Skin: Skin is warm and dry. No rash noted.  Psychiatric: Affect normal.  Vitals reviewed.  Recent Results (from the past  2160 hour(s))  Testosterone Total,Free,Bio, Males     Status: None   Collection Time: 11/15/16  9:15 AM  Result Value Ref Range   Testosterone 470 250 - 827 ng/dL   Albumin 4.4 3.6 - 5.1 g/dL   Sex Hormone Binding 31 10 - 50 nmol/L   Testosterone, Free 66.7 46.0 - 224.0 pg/mL   Testosterone, Bioavailable 134.2 110.0 - 575.0 ng/dL  B12     Status: Abnormal   Collection Time: 11/15/16  9:15 AM  Result Value Ref Range   Vitamin B-12 113 (L) 211 - 911 pg/mL  Vitamin D (25 hydroxy)     Status: Abnormal   Collection Time: 11/15/16  9:15 AM  Result Value Ref Range   VITD 17.29 (L) 30.00 - 100.00 ng/mL    Assessment/Plan: 1. Gastroenteritis Most likley viral giving + sick contact. Will check labs today to further assess. Start Zofran and [employer]. Some mild nasal congestion x 1 day. No indication for ABX presently. Will start Flonase. Close monitoring of symptoms advised.  - CBC w/Diff - Comp Met (CMET)   Leeanne Rio, PA-C

## 2017-01-30 ENCOUNTER — Encounter: Payer: Self-pay | Admitting: Physician Assistant

## 2017-01-30 DIAGNOSIS — R0989 Other specified symptoms and signs involving the circulatory and respiratory systems: Secondary | ICD-10-CM

## 2017-02-12 ENCOUNTER — Encounter: Payer: Self-pay | Admitting: Physician Assistant

## 2017-02-15 ENCOUNTER — Ambulatory Visit (INDEPENDENT_AMBULATORY_CARE_PROVIDER_SITE_OTHER): Payer: BC Managed Care – PPO | Admitting: Medical

## 2017-02-15 VITALS — BP 138/81 | HR 88 | Temp 98.1°F | Ht 73.0 in | Wt 214.4 lb

## 2017-02-15 DIAGNOSIS — H6690 Otitis media, unspecified, unspecified ear: Secondary | ICD-10-CM

## 2017-02-15 DIAGNOSIS — H60502 Unspecified acute noninfective otitis externa, left ear: Secondary | ICD-10-CM

## 2017-02-15 DIAGNOSIS — H9202 Otalgia, left ear: Secondary | ICD-10-CM | POA: Diagnosis not present

## 2017-02-15 MED ORDER — HYDROCORTISONE-ACETIC ACID 1-2 % OT SOLN: 3.0000 [drp] | Freq: Four times a day (QID) | OTIC | 0 refills | Status: DC

## 2017-02-15 MED ORDER — METHYLPREDNISOLONE ACETATE 40 MG/ML IJ SUSP
40.0000 mg | Freq: Once | INTRAMUSCULAR | Status: AC
  Administered 2017-02-15: 40 mg via INTRAMUSCULAR

## 2017-02-15 MED ORDER — AZITHROMYCIN 250 MG PO TABS: ORAL_TABLET | ORAL | 0 refills | Status: DC

## 2017-02-15 MED ORDER — CEFTRIAXONE SODIUM 1 G IJ SOLR
1.0000 g | Freq: Once | INTRAMUSCULAR | Status: AC
  Administered 2017-02-15: 1 g via INTRAMUSCULAR

## 2017-02-15 NOTE — Patient Instructions (Addendum)
By your level of pain and your physical exam, it appears to have otitis externa with otitis media(infection/swelling of the canal with infection of the tympanic membrane).    Since we are approaching the weekend and your symptoms are worsening despite treatment, I want to treat you more aggressively.  I think you'll benefit from Rocephin 1 g injection. Also injection of Depo-Medrol steroid. Depo-Medrol might decrease sinus inflammation which you had over the summer and may decrease pressure in the eustachian tube.  Since you have not responded to Augmentin for one week, I am going to prescribe azithromycin and you can stop the Augmentin.  Also follow up  Monday afternoon. I will recheck your ear and might give second injection of Rocephin if indicated.  I'm prescribing new eardrop called the vosol. Can stop the current eardrop.  Follow-up on Monday or as needed.  Would still encourage to be seen ENT on Wednesday regardless if your ear feels better to discuss your chronic sinus issues over the summer and even some now.

## 2017-02-15 NOTE — Addendum Note (Signed)
Addended by: Roma Kayser on: 02/15/2017 04:23 PM   Modules accepted: Orders

## 2017-02-15 NOTE — Progress Notes (Signed)
Subjective:    Patient ID: Kyle Miller, male    DOB: January 15, 1976, 41 y.o.   MRN: [id number]  HPI  Pt in for some states some recent nasal congestion for one week. He went to urgent care and told some fluid in his ears. Given ear drops and amoxicillin. Pt left ear pain has gotten a lot worse. Sharp pain and some ringing.   Pt has appointment with ENT on this coming Wednesday.  No fever, no chills or sweats. T max of 99.  Pt has had some recent sinus pain as well. But Sinus issues since early summer.   Pt [facility] [place]. Has used both ofloxin drops and augmentin for 6 days and not better. Actually worse per pt.    Review of Systems  Constitutional: Negative for chills, fatigue and fever.  HENT: Positive for ear pain and sinus pressure. Negative for congestion, postnasal drip, rhinorrhea and sinus pain.        Hx of over summer and some now.  Respiratory: Negative for cough, chest tightness, shortness of breath and wheezing.   Cardiovascular: Negative for chest pain and palpitations.  Gastrointestinal: Negative for abdominal pain.  Musculoskeletal: Negative for back pain and gait problem.  Skin: Negative for rash.  Neurological: Negative for syncope, weakness, light-headedness and headaches.  Hematological: Negative for adenopathy. Does not bruise/bleed easily.  Psychiatric/Behavioral: Negative for behavioral problems and confusion.    Past Medical History:  Diagnosis Date  . Asthma   . Bulging lumbar disc   . Depression   . GERD (gastroesophageal reflux disease)   . History of chicken pox      Social History   Social History  . Marital status: Married    Spouse name: N/A  . Number of children: N/A  . Years of education: N/A   Occupational History  . Not on file.   Social History Main Topics  . Smoking status: Never Smoker  . Smokeless tobacco: Never Used  . Alcohol use No  . Drug use: No  . Sexual activity: Yes    Partners:  Female   Other Topics Concern  . Not on file   Social History Narrative   He live with wife and youngest daughter.   He works as a [employer] fast streamer.   Highest level of education:  College, BS    Past Surgical History:  Procedure Laterality Date  . WISDOM TOOTH EXTRACTION      Family History  Problem Relation Age of Onset  . Healthy Mother        Living  . Healthy Father        Living  . Thyroid disease Brother   . Asthma Daughter     Allergies  Allergen Reactions  . Hydrocodone Itching and Other (See Comments)    Hypersensitivity with whole pill    Current Outpatient Prescriptions on File Prior to Visit  Medication Sig Dispense Refill  . albuterol (PROVENTIL HFA;VENTOLIN HFA) 108 (90 Base) MCG/ACT inhaler Inhale 2 puffs into the lungs every 6 (six) hours as needed for wheezing or shortness of breath. 1 Inhaler 3  . cyanocobalamin 500 MCG tablet Take 1 tablet (500 mcg total) by mouth daily. 30 tablet 0  . cyclobenzaprine (FLEXERIL) 5 MG tablet TAKE 1 TABLET (5 MG TOTAL) BY MOUTH 3 (THREE) TIMES DAILY AS NEEDED FOR MUSCLE SPASMS. 90 tablet 1  . fluticasone (FLONASE) 50 MCG/ACT nasal spray Place 2 sprays into both nostrils daily. [place]  g 6  . Melatonin 5 MG TABS Take by mouth.    . ondansetron (ZOFRAN) 4 MG tablet Take 1 tablet (4 mg total) by mouth every 8 (eight) hours as needed for nausea or vomiting. 20 tablet 0  . sulindac (CLINORIL) 200 MG tablet TAKE 1 TABLET BY MOUTH TWICE A DAY AS NEEDED 60 tablet 1  . traZODone (DESYREL) 50 MG tablet TAKE 1 TABLET (50 MG TOTAL) BY MOUTH AT BEDTIME. 30 tablet 3  . Vitamin D, Ergocalciferol, (DRISDOL) 50000 units CAPS capsule Take 1 capsule (50,000 Units total) by mouth every 7 (seven) days. (Patient not taking: Reported on 02/15/2017) 12 capsule 0   No current facility-administered medications on file prior to visit.     BP 138/81   Pulse 88   Temp 98.1 F (36.7 C) (Oral)   Ht 6\' 1"  (1.854 m)   Wt 214 lb 6.4 oz (97.3 kg)   SpO2 100%    BMI 28.29 kg/m       Objective:   Physical Exam  General  Mental Status - Alert. General Appearance - Well groomed. Not in acute distress.  Skin Rashes- No Rashes.  HEENT Head- Normal. Ear Auditory Canal - Left- appears to have some wax present. Small bottom portion of tm seen and that portion looks red. Tragal tenderness(attempt to remove wax with currette but had severe pain) Right - Normal.Tympanic Membrane- Left- Normal. Right- Normal.  Note I was cautious and did not want to remove wax due to his wincing when I attempted removed with curette. When outside the room he actually used a curette or Q-tip but he got out most of the wax that was just strategically oriented work could not see them tympanic membrane. I rechecked and the membrane looked mildly dull. The canal does look very swollen and he had a lot of whitish residue at the base of the tympanic membrane membrane  Post lavage. CMA removed piece of cotton. After review post lavage canal is obviously very swollen and tm moderate red on exam when full view of tm seen. I don't see any residual cotton present.   Eye Sclera/Conjunctiva- Left- Normal. Right- Normal. Nose & Sinuses Nasal Mucosa- Left-  Boggy and Congested. Right-  Boggy and  Congested.Bilateral maxillary and frontal sinus pressure. Mouth & Throat Lips: Upper Lip- Normal: no dryness, cracking, pallor, cyanosis, or vesicular eruption. Lower Lip-Normal: no dryness, cracking, pallor, cyanosis or vesicular eruption. Buccal Mucosa- Bilateral- No Aphthous ulcers. Oropharynx- No Discharge or Erythema. Tonsils: Characteristics- Bilateral- No Erythema or Congestion. Size/Enlargement- Bilateral- No enlargement. Discharge- bilateral-None.  Neck Neck- Supple. No Masses.   Chest and Lung Exam Auscultation: Breath Sounds:-Clear even and unlabored.  Cardiovascular Auscultation:Rythm- Regular, rate and rhythm. Murmurs & Other Heart Sounds:Ausculatation of the heart  reveal- No Murmurs.  Lymphatic Head & Neck General Head & Neck Lymphatics: Bilateral: Description- No Localized lymphadenopathy.       Assessment & Plan:  By your level of pain and your physical exam, it appears to have otitis externa with otitis media(infection/swelling of the canal with infection of the tympanic membrane).    Since we are approaching the weekend and your symptoms are worsening despite treatment, I want to treat you more aggressively.  I think you'll benefit from Rocephin 1 g injection. Also injection of Depo-Medrol steroid. Depo-Medrol might decrease sinus inflammation which you had over the summer and may decrease pressure in the eustachian tube.  Since you have not responded to Augmentin for one week, I am  going to prescribe azithromycin and you can stop the Augmentin.  Also follow up  Monday afternoon. I will recheck your ear and might give second injection of Rocephin if indicated.  I'm prescribing new eardrop called the vosol. Can stop the current eardrop.  Follow-up on Monday or as needed.  Would still encourage seen ENT on Wednesday regardless if your ear feels better to discuss your chronic sinus issues over the summer and even some now.  Raffaela Ladley, Percell Miller, PA-C

## 2017-02-15 NOTE — Progress Notes (Signed)
Pre visit review using our clinic tool,if applicable. No additional management support is needed unless otherwise documented below in the visit note.  

## 2017-02-19 ENCOUNTER — Encounter: Payer: Self-pay | Admitting: Medical

## 2017-02-19 ENCOUNTER — Ambulatory Visit (INDEPENDENT_AMBULATORY_CARE_PROVIDER_SITE_OTHER): Payer: BC Managed Care – PPO | Admitting: Medical

## 2017-02-19 VITALS — BP 131/78 | HR 83 | Temp 98.0°F | Resp 16 | Ht 73.0 in | Wt 213.4 lb

## 2017-02-19 DIAGNOSIS — H6092 Unspecified otitis externa, left ear: Secondary | ICD-10-CM | POA: Diagnosis not present

## 2017-02-19 DIAGNOSIS — E559 Vitamin D deficiency, unspecified: Secondary | ICD-10-CM | POA: Diagnosis not present

## 2017-02-19 DIAGNOSIS — H6690 Otitis media, unspecified, unspecified ear: Secondary | ICD-10-CM

## 2017-02-19 MED ORDER — CEFTRIAXONE SODIUM 1 G IJ SOLR
1.0000 g | Freq: Once | INTRAMUSCULAR | Status: AC
  Administered 2017-02-19: 1 g via INTRAMUSCULAR

## 2017-02-19 NOTE — Patient Instructions (Addendum)
Your left ear looks a lot better now than before. The canal looks a lot less swollen. The tympanic membrane /eardrum has small area of persisting redness. I do think that he would benefit from another gram of Rocephin to get rid of the infection completely. Then go ahead and finish off the course of azithromycin. Continue the eardrops as well.  I do think you will do well but if your pain flares please let me know.  For history of  low B12 and vitamin D repeat lab today, it appears you have a future standing order that you could get done today.  Follow-up as needed.

## 2017-02-19 NOTE — Progress Notes (Signed)
Subjective:    Patient ID: Kyle Miller, male    DOB: 1975-12-04, 41 y.o.   MRN: [id number]  HPI   Pt in for follow up.  He states a lot less ear pressure, no pain or no problems hearing today. Pt responded well to new drops, injection antibiotic and azithromycin. Pt states started to hear very well yesterday.  He is on last day of azithromycin antibiotic.  See last note regarding his severe otitis externa, ear infection and cotton that MA eventually washed out.     Review of Systems  Constitutional: Negative for chills, fatigue and fever.  HENT: Negative for congestion, ear discharge, ear pain, hearing loss, mouth sores, nosebleeds, postnasal drip, sinus pain, sinus pressure and sneezing.        Left ear pressure residual.  Respiratory: Negative for cough, chest tightness, shortness of breath and wheezing.   Cardiovascular: Negative for chest pain and palpitations.  Gastrointestinal: Negative for abdominal pain, blood in stool, constipation, diarrhea, nausea and vomiting.  Musculoskeletal: Negative for back pain and gait problem.  Skin: Negative for rash.  Neurological: Negative for dizziness, speech difficulty, light-headedness and headaches.  Hematological: Negative for adenopathy. Does not bruise/bleed easily.  Psychiatric/Behavioral: Negative for behavioral problems, confusion, hallucinations, self-injury and suicidal ideas. The patient is not nervous/anxious.     Past Medical History:  Diagnosis Date  . Asthma   . Bulging lumbar disc   . Depression   . GERD (gastroesophageal reflux disease)   . History of chicken pox      Social History   Social History  . Marital status: Married    Spouse name: N/A  . Number of children: N/A  . Years of education: N/A   Occupational History  . Not on file.   Social History Main Topics  . Smoking status: Never Smoker  . Smokeless tobacco: Never Used  . Alcohol use No  . Drug use: No  . Sexual activity: Yes   Partners: Female   Other Topics Concern  . Not on file   Social History Narrative   He live with wife and youngest daughter.   He works as a [employer] fast streamer.   Highest level of education:  College, BS    Past Surgical History:  Procedure Laterality Date  . WISDOM TOOTH EXTRACTION      Family History  Problem Relation Age of Onset  . Healthy Mother        Living  . Healthy Father        Living  . Thyroid disease Brother   . Asthma Daughter     Allergies  Allergen Reactions  . Hydrocodone Itching and Other (See Comments)    Hypersensitivity with whole pill    Current Outpatient Prescriptions on File Prior to Visit  Medication Sig Dispense Refill  . acetic acid-hydrocortisone (VOSOL-HC) OTIC solution Place 3 drops into the left ear 4 (four) times daily. 10 mL 0  . albuterol (PROVENTIL HFA;VENTOLIN HFA) 108 (90 Base) MCG/ACT inhaler Inhale 2 puffs into the lungs every 6 (six) hours as needed for wheezing or shortness of breath. 1 Inhaler 3  . azithromycin (ZITHROMAX) 250 MG tablet Take 2 tablets by mouth on day 1, followed by 1 tablet by mouth daily for 4 days. 6 tablet 0  . cyanocobalamin 500 MCG tablet Take 1 tablet (500 mcg total) by mouth daily. 30 tablet 0  . cyclobenzaprine (FLEXERIL) 5 MG tablet TAKE 1 TABLET (5 MG TOTAL) BY MOUTH 3 (THREE) TIMES DAILY  AS NEEDED FOR MUSCLE SPASMS. 90 tablet 1  . fluticasone (FLONASE) 50 MCG/ACT nasal spray Place 2 sprays into both nostrils daily. 16 g 6  . Melatonin 5 MG TABS Take by mouth.    . ondansetron (ZOFRAN) 4 MG tablet Take 1 tablet (4 mg total) by mouth every 8 (eight) hours as needed for nausea or vomiting. 20 tablet 0  . sulindac (CLINORIL) 200 MG tablet TAKE 1 TABLET BY MOUTH TWICE A DAY AS NEEDED 60 tablet 1  . traZODone (DESYREL) 50 MG tablet TAKE 1 TABLET (50 MG TOTAL) BY MOUTH AT BEDTIME. 30 tablet 3  . Vitamin D, Ergocalciferol, (DRISDOL) 50000 units CAPS capsule Take 1 capsule (50,000 Units total) by mouth every 7 (seven)  days. 12 capsule 0   No current facility-administered medications on file prior to visit.     BP 131/78   Pulse 83   Temp 98 F (36.7 C) (Oral)   Resp 16   Ht 6\' 1"  (1.854 m)   Wt 213 lb 6.4 oz (96.8 kg)   SpO2 98%   BMI 28.15 kg/m       Objective:   Physical Exam  General  Mental Status - Alert. General Appearance - Well groomed. Not in acute distress.  Skin Rashes- No Rashes.  HEENT Head- Normal. Ear Auditory Canal - Left- faint residual swelling. Much less swollen now. Right - Normal.Tympanic Membrane- Left- a lot better. Only small area of redness. Right- Normal. Eye Sclera/Conjunctiva- Left- Normal. Right- Normal. Nose & Sinuses Nasal Mucosa- Left-  Not Boggy and Congested. Right-  Not Boggy and  Congested.Bilateral no maxillary and no frontal sinus pressure. Mouth & Throat Lips: Upper Lip- Normal: no dryness, cracking, pallor, cyanosis, or vesicular eruption. Lower Lip-Normal: no dryness, cracking, pallor, cyanosis or vesicular eruption. Buccal Mucosa- Bilateral- No Aphthous ulcers. Oropharynx- No Discharge or Erythema. Tonsils: Characteristics- Bilateral- No Erythema or Congestion. Size/Enlargement- Bilateral- No enlargement. Discharge- bilateral-None.  Neck Neck- Supple. No Masses.   Chest and Lung Exam Auscultation: Breath Sounds:-Clear even and unlabored.  Cardiovascular Auscultation:Rythm- Regular, rate and rhythm. Murmurs & Other Heart Sounds:Ausculatation of the heart reveal- No Murmurs.  Lymphatic Head & Neck General Head & Neck Lymphatics: Bilateral: Description- No Localized lymphadenopathy.       Assessment & Plan:  Your left ear looks a lot better now than before. The canal looks a lot less swollen. The tympanic membrane/ eardrum has small area of persisting redness. I do think that he would benefit from another gram of Rocephin to get rid of the infection completely. Then go ahead and finish off the course of azithromycin. Continue the  eardrops as well.  I do think you will do well but if your pain flares please let me know.  For history of low  B12 and vitamin D repeat lab today, it appears you have a future standing order that you could get done today.  Follow-up as needed.   Shikha Bibb, Percell Miller, PA-C

## 2017-02-20 ENCOUNTER — Other Ambulatory Visit: Payer: Self-pay | Admitting: Emergency Medicine

## 2017-02-20 DIAGNOSIS — E559 Vitamin D deficiency, unspecified: Secondary | ICD-10-CM

## 2017-02-20 LAB — VITAMIN D 25 HYDROXY (VIT D DEFICIENCY, FRACTURES): VITD: 25.49 ng/mL — AB (ref 30.00–100.00)

## 2017-02-20 MED ORDER — VITAMIN D (ERGOCALCIFEROL) 1.25 MG (50000 UNIT) PO CAPS: 50000.0000 [IU] | ORAL_CAPSULE | ORAL | 0 refills | Status: DC

## 2017-02-20 MED ORDER — VITAMIN D3 25 MCG (1000 UNIT) PO TABS: 1000.0000 [IU] | ORAL_TABLET | Freq: Every day | ORAL | 0 refills | Status: DC

## 2017-05-07 ENCOUNTER — Other Ambulatory Visit: Payer: Self-pay | Admitting: Physician Assistant

## 2017-05-07 DIAGNOSIS — E559 Vitamin D deficiency, unspecified: Secondary | ICD-10-CM

## 2017-05-10 ENCOUNTER — Ambulatory Visit: Payer: BC Managed Care – PPO | Admitting: Physician Assistant

## 2017-05-10 ENCOUNTER — Encounter: Payer: Self-pay | Admitting: Physician Assistant

## 2017-05-10 ENCOUNTER — Other Ambulatory Visit: Payer: Self-pay

## 2017-05-10 VITALS — BP 132/82 | HR 79 | Temp 98.0°F | Resp 14 | Ht 73.0 in | Wt 212.0 lb

## 2017-05-10 DIAGNOSIS — F5101 Primary insomnia: Secondary | ICD-10-CM

## 2017-05-10 DIAGNOSIS — R5382 Chronic fatigue, unspecified: Secondary | ICD-10-CM | POA: Diagnosis not present

## 2017-05-10 DIAGNOSIS — K219 Gastro-esophageal reflux disease without esophagitis: Secondary | ICD-10-CM | POA: Diagnosis not present

## 2017-05-10 LAB — CBC WITH DIFFERENTIAL/PLATELET
BASOS ABS: 0 10*3/uL (ref 0.0–0.1)
BASOS PCT: 0.1 % (ref 0.0–3.0)
EOS ABS: 0 10*3/uL (ref 0.0–0.7)
Eosinophils Relative: 0.3 % (ref 0.0–5.0)
HEMATOCRIT: 47.2 % (ref 39.0–52.0)
HEMOGLOBIN: 15.6 g/dL (ref 13.0–17.0)
LYMPHS PCT: 20.4 % (ref 12.0–46.0)
Lymphs Abs: 2.6 10*3/uL (ref 0.7–4.0)
MCHC: 33 g/dL (ref 30.0–36.0)
MCV: 90.1 fl (ref 78.0–100.0)
Monocytes Absolute: 1.1 10*3/uL — ABNORMAL HIGH (ref 0.1–1.0)
Monocytes Relative: 9 % (ref 3.0–12.0)
NEUTROS ABS: 8.9 10*3/uL — AB (ref 1.4–7.7)
Neutrophils Relative %: 70.2 % (ref 43.0–77.0)
PLATELETS: 320 10*3/uL (ref 150.0–400.0)
RBC: 5.24 Mil/uL (ref 4.22–5.81)
RDW: 13.1 % (ref 11.5–15.5)
WBC: 12.7 10*3/uL — ABNORMAL HIGH (ref 4.0–10.5)

## 2017-05-10 LAB — COMPREHENSIVE METABOLIC PANEL
ALT: 21 U/L (ref 0–53)
AST: 12 U/L (ref 0–37)
Albumin: 4.6 g/dL (ref 3.5–5.2)
Alkaline Phosphatase: 70 U/L (ref 39–117)
BUN: 14 mg/dL (ref 6–23)
CALCIUM: 9.4 mg/dL (ref 8.4–10.5)
CHLORIDE: 100 meq/L (ref 96–112)
CO2: 30 meq/L (ref 19–32)
Creatinine, Ser: 0.97 mg/dL (ref 0.40–1.50)
GFR: 89.8 mL/min (ref >60.00)
Glucose, Bld: 80 mg/dL (ref 70–99)
Potassium: 4.1 mEq/L (ref 3.5–5.1)
Sodium: 137 mEq/L (ref 135–145)
Total Bilirubin: 0.5 mg/dL (ref 0.2–1.2)
Total Protein: 6.9 g/dL (ref 6.0–8.3)

## 2017-05-10 LAB — TSH: TSH: 1.71 u[IU]/mL (ref 0.35–4.50)

## 2017-05-10 LAB — H. PYLORI ANTIBODY, IGG: H PYLORI IGG: NEGATIVE

## 2017-05-10 LAB — VITAMIN B12: VITAMIN B 12: 196 pg/mL — AB (ref 211–911)

## 2017-05-10 LAB — VITAMIN D 25 HYDROXY (VIT D DEFICIENCY, FRACTURES): VITD: 26.69 ng/mL — AB (ref 30.00–100.00)

## 2017-05-10 LAB — SEDIMENTATION RATE: Sed Rate: 1 mm/hr (ref 0–15)

## 2017-05-10 MED ORDER — PANTOPRAZOLE SODIUM 40 MG PO TBEC: 40.0000 mg | DELAYED_RELEASE_TABLET | Freq: Every day | ORAL | 3 refills | Status: DC

## 2017-05-10 MED ORDER — RANITIDINE HCL 300 MG PO TABS: 300.0000 mg | ORAL_TABLET | Freq: Every day | ORAL | 0 refills | Status: DC

## 2017-05-10 MED ORDER — ESZOPICLONE 1 MG PO TABS: 1.0000 mg | ORAL_TABLET | Freq: Every evening | ORAL | 0 refills | Status: DC | PRN

## 2017-05-10 NOTE — Patient Instructions (Addendum)
Please go to the lab today for blood work.  I will call you with your results. We will alter treatment regimen(s) if indicated by your results.   Please continue OTC vitamin D supplement. Stop the Trazodone. I am giving you a script for Lunesta to help with sleep for now.  Stop the Prilosec. Start the Protonix each morning and 300 mg Ranitidine at night.  This should help with the reflux. Stop the Sunilac and other anti-inflammatories  Follow-up in 1 week.

## 2017-05-10 NOTE — Progress Notes (Signed)
Patient presents to clinic today c/o sleeplessness, fatigue and significant heart burn/reflux.  Patient with history of insomnia, previously helped with trazodone. Patient had an epidural injection last Friday.  Since then has noticed increase in sleeplessness.  Has not slept in the past 48 hours despite use of his trazodone.  Has noticed increased irritability.  Patient notes this has worsened his chronic fatigue that he has been experiencing over the past few months.  Patient states energy levels are significantly decreased.  Denies depressed mood or anhedonia currently.  Patient denies fevers, chills or night sweats.  Denies unexplainable changes in weight but has noticed a slight decrease in muscle mass.  Patient also notices difficulty with focus and with interest in sexual activity.  Denies any change in function.  Patient does have history of both vitamin D and vitamin B12 deficiencies.  Has recently completed a course of prescription vitamin D.  Is taking a 1000 unit/day over-the-counter supplement.  Is not taking his B12 supplement as directed.  Of note, patient also notices significant heartburn since his epidural injection on Friday.  Notes daily heartburn, worsened with laying down.  States it is also preventing him from sleeping.  Has taken omeprazole without significant improvement.  Denies nausea or vomiting.  Denies epigastric pain, melena, hematochezia or tenesmus.  Denies change in stool caliber.  Past Medical History:  Diagnosis Date  . Asthma   . Bulging lumbar disc   . Depression   . GERD (gastroesophageal reflux disease)   . History of chicken pox     Current Outpatient Medications on File Prior to Visit  Medication Sig Dispense Refill  . albuterol (PROVENTIL HFA;VENTOLIN HFA) 108 (90 Base) MCG/ACT inhaler Inhale 2 puffs into the lungs every 6 (six) hours as needed for wheezing or shortness of breath. 1 Inhaler 3  . cholecalciferol (VITAMIN D) 1000 units tablet Take 1  tablet (1,000 Units total) by mouth daily. 30 tablet 0  . fluticasone (FLONASE) 50 MCG/ACT nasal spray Place 2 sprays into both nostrils daily. 16 g 6  . Melatonin 5 MG TABS Take by mouth.    . sulindac (CLINORIL) 200 MG tablet TAKE 1 TABLET BY MOUTH TWICE A DAY AS NEEDED 60 tablet 1  . cyclobenzaprine (FLEXERIL) 5 MG tablet TAKE 1 TABLET (5 MG TOTAL) BY MOUTH 3 (THREE) TIMES DAILY AS NEEDED FOR MUSCLE SPASMS. (Patient not taking: Reported on 05/10/2017) 90 tablet 1   No current facility-administered medications on file prior to visit.     Allergies  Allergen Reactions  . Hydrocodone Itching and Other (See Comments)    Hypersensitivity with whole pill    Family History  Problem Relation Age of Onset  . Healthy Mother        Living  . Healthy Father        Living  . Thyroid disease Brother   . Asthma Daughter     Social History   Socioeconomic History  . Marital status: Married    Spouse name: None  . Number of children: None  . Years of education: None  . Highest education level: None  Social Needs  . Financial resource strain: None  . Food insecurity - worry: None  . Food insecurity - inability: None  . Transportation needs - medical: None  . Transportation needs - non-medical: None  Occupational History  . None  Tobacco Use  . Smoking status: Never Smoker  . Smokeless tobacco: Never Used  Substance and Sexual Activity  . Alcohol  use: No    Alcohol/week: 0.0 oz  . Drug use: No  . Sexual activity: Yes    Partners: Female  Other Topics Concern  . None  Social History Narrative   He live with wife and youngest daughter.   He works as a Civil Service fast streamer.   Highest level of education:  College, BS   Review of Systems - See HPI.  All other ROS are negative.  BP 132/82   Pulse 79   Temp 98 F (36.7 C) (Oral)   Resp 14   Ht 6' 1"  (1.854 m)   Wt 212 lb (96.2 kg)   SpO2 98%   BMI 27.97 kg/m   Physical Exam  Constitutional: He is oriented to person, place, and  time and well-developed, well-nourished, and in no distress.  HENT:  Head: Normocephalic and atraumatic.  Right Ear: External ear normal.  Left Ear: External ear normal.  Nose: Nose normal.  Mouth/Throat: Oropharynx is clear and moist. No oropharyngeal exudate.  Tympanic membranes within normal limits bilaterally.  Eyes: Conjunctivae and EOM are normal. Pupils are equal, round, and reactive to light.  Neck: Neck supple.  Cardiovascular: Normal rate, regular rhythm, normal heart sounds and intact distal pulses.  Pulmonary/Chest: Effort normal and breath sounds normal. No respiratory distress. He has no wheezes. He has no rales. He exhibits no tenderness.  Abdominal: Soft. Bowel sounds are normal. He exhibits no distension and no mass. There is no tenderness. There is no rebound and no guarding.  Lymphadenopathy:    He has no cervical adenopathy.  Neurological: He is alert and oriented to person, place, and time.  Skin: Skin is warm and dry. No rash noted.  Psychiatric: Affect normal.  Vitals reviewed.  Recent Results (from the past 2160 hour(s))  VITAMIN D 25 Hydroxy (Vit-D Deficiency, Fractures)     Status: Abnormal   Collection Time: 02/19/17  4:33 PM  Result Value Ref Range   VITD 25.49 (L) 30.00 - 100.00 ng/mL   Assessment/Plan: 1. Chronic fatigue Likely multifactorial. Patient with history of B12 and Vitamin D deficiency. Is not taking B12 supplement. On 1000 units/day D3 currently after completing Rx Ergocalciferol. Will check panel today to assess potential causes of patient's symptoms. - CBC w/Diff - Comp Met (CMET) - TSH - B12 - Vitamin D (25 hydroxy) - Sedimentation rate - Antinuclear Antib (ANA) - B. burgdorfi antibodies  2. Primary insomnia Long-standing history. Has improved due to steady work shifts and with use of Trazodone. Medication subtherapeutic over the past week. This is likely due to epidural steroid injection. Will give trial of Lunesta to use over the  next few nights.  3. Gastroesophageal reflux disease without esophagitis Will check h. Pylori today. Unclear etiology of worsened symptoms. No recent NSAID use. He is encouraged to avoid use of these or alcohol. Exam unreamrakble. Symptoms improved with GI cocktail. Start Protonix daily and Ranitidine nightly. May need GI assessment if not improving. - H. pylori antibody, IgG   Leeanne Rio, PA-C

## 2017-05-11 ENCOUNTER — Other Ambulatory Visit: Payer: Self-pay | Admitting: Physician Assistant

## 2017-05-11 DIAGNOSIS — E538 Deficiency of other specified B group vitamins: Secondary | ICD-10-CM

## 2017-05-11 DIAGNOSIS — E559 Vitamin D deficiency, unspecified: Secondary | ICD-10-CM

## 2017-05-11 DIAGNOSIS — D72829 Elevated white blood cell count, unspecified: Secondary | ICD-10-CM

## 2017-05-11 LAB — B. BURGDORFI ANTIBODIES

## 2017-05-11 LAB — ANA: ANA: NEGATIVE

## 2017-05-11 MED ORDER — VITAMIN B-12 1000 MCG PO TABS: 1000.0000 ug | ORAL_TABLET | Freq: Every day | ORAL | 0 refills | Status: DC

## 2017-05-11 MED ORDER — VITAMIN D3 25 MCG (1000 UNIT) PO TABS: 2000.0000 [IU] | ORAL_TABLET | Freq: Every day | ORAL | 0 refills | Status: DC

## 2017-05-11 MED ORDER — VITAMIN D (ERGOCALCIFEROL) 1.25 MG (50000 UNIT) PO CAPS: ORAL_CAPSULE | ORAL | 0 refills | Status: DC

## 2017-05-14 ENCOUNTER — Ambulatory Visit: Payer: BC Managed Care – PPO

## 2017-05-17 ENCOUNTER — Ambulatory Visit (INDEPENDENT_AMBULATORY_CARE_PROVIDER_SITE_OTHER): Payer: BC Managed Care – PPO | Admitting: *Deleted

## 2017-05-17 DIAGNOSIS — E538 Deficiency of other specified B group vitamins: Secondary | ICD-10-CM

## 2017-05-17 MED ORDER — CYANOCOBALAMIN 1000 MCG/ML IJ SOLN
1000.0000 ug | Freq: Once | INTRAMUSCULAR | Status: AC
  Administered 2017-05-17: 1000 ug via INTRAMUSCULAR

## 2017-05-18 ENCOUNTER — Other Ambulatory Visit: Payer: Self-pay | Admitting: Physician Assistant

## 2017-05-18 ENCOUNTER — Encounter: Payer: Self-pay | Admitting: Emergency Medicine

## 2017-05-18 NOTE — Telephone Encounter (Signed)
My chart message sent to the patient if sleep improved with the [facility] medication

## 2017-05-23 ENCOUNTER — Ambulatory Visit (INDEPENDENT_AMBULATORY_CARE_PROVIDER_SITE_OTHER): Payer: BC Managed Care – PPO

## 2017-05-23 ENCOUNTER — Other Ambulatory Visit: Payer: Self-pay | Admitting: Physician Assistant

## 2017-05-23 DIAGNOSIS — E538 Deficiency of other specified B group vitamins: Secondary | ICD-10-CM | POA: Diagnosis not present

## 2017-05-23 MED ORDER — CYANOCOBALAMIN 1000 MCG/ML IJ SOLN
1000.0000 ug | Freq: Once | INTRAMUSCULAR | Status: AC
  Administered 2017-05-23: 1000 ug via INTRAMUSCULAR

## 2017-05-23 MED ORDER — CYANOCOBALAMIN 1000 MCG/ML IJ SOLN: 1000.0000 ug | Freq: Once | INTRAMUSCULAR | 0 refills | Status: DC

## 2017-05-23 MED ORDER — CYANOCOBALAMIN 1000 MCG/ML IJ SOLN: 1000.0000 ug | Freq: Once | INTRAMUSCULAR | 0 refills | Status: AC

## 2017-05-24 NOTE — Telephone Encounter (Signed)
Refills sent to pharmacy. 

## 2017-05-24 NOTE — Telephone Encounter (Signed)
Last OV 05/10/17 lunesta last filled 05/10/17 #15 with 0

## 2017-05-27 ENCOUNTER — Encounter: Payer: Self-pay | Admitting: Physician Assistant

## 2017-06-07 ENCOUNTER — Other Ambulatory Visit: Payer: Self-pay | Admitting: Physician Assistant

## 2017-06-07 ENCOUNTER — Telehealth: Payer: Self-pay | Admitting: Emergency Medicine

## 2017-06-07 NOTE — Telephone Encounter (Signed)
Prior Authorization for Lunesta approved from 06/06/2017-06/06/2020 thru [facility]. Notified patient pharmacy and patient of approval.

## 2017-06-29 ENCOUNTER — Other Ambulatory Visit (INDEPENDENT_AMBULATORY_CARE_PROVIDER_SITE_OTHER): Payer: BC Managed Care – PPO

## 2017-06-29 DIAGNOSIS — E538 Deficiency of other specified B group vitamins: Secondary | ICD-10-CM

## 2017-06-29 DIAGNOSIS — E559 Vitamin D deficiency, unspecified: Secondary | ICD-10-CM | POA: Diagnosis not present

## 2017-06-29 DIAGNOSIS — D72829 Elevated white blood cell count, unspecified: Secondary | ICD-10-CM

## 2017-06-29 LAB — CBC WITH DIFFERENTIAL/PLATELET
BASOS ABS: 0 10*3/uL (ref 0.0–0.1)
Basophils Relative: 0.4 % (ref 0.0–3.0)
Eosinophils Absolute: 0.1 10*3/uL (ref 0.0–0.7)
Eosinophils Relative: 1.3 % (ref 0.0–5.0)
HCT: 47.6 % (ref 39.0–52.0)
Hemoglobin: 15.7 g/dL (ref 13.0–17.0)
LYMPHS ABS: 2.4 10*3/uL (ref 0.7–4.0)
Lymphocytes Relative: 31 % (ref 12.0–46.0)
MCHC: 33 g/dL (ref 30.0–36.0)
MCV: 91.3 fl (ref 78.0–100.0)
MONO ABS: 0.7 10*3/uL (ref 0.1–1.0)
Monocytes Relative: 9.7 % (ref 3.0–12.0)
NEUTROS PCT: 57.6 % (ref 43.0–77.0)
Neutro Abs: 4.4 10*3/uL (ref 1.4–7.7)
Platelets: 292 10*3/uL (ref 150.0–400.0)
RBC: 5.21 Mil/uL (ref 4.22–5.81)
RDW: 13.8 % (ref 11.5–15.5)
WBC: 7.7 10*3/uL (ref 4.0–10.5)

## 2017-06-29 LAB — VITAMIN D 25 HYDROXY (VIT D DEFICIENCY, FRACTURES): VITD: 43.24 ng/mL (ref 30.00–100.00)

## 2017-06-29 LAB — VITAMIN B12: VITAMIN B 12: 1457 pg/mL — AB (ref 211–911)

## 2017-06-30 ENCOUNTER — Encounter: Payer: Self-pay | Admitting: Physician Assistant

## 2017-08-03 ENCOUNTER — Ambulatory Visit: Payer: BC Managed Care – PPO | Admitting: Physician Assistant

## 2017-08-07 ENCOUNTER — Ambulatory Visit: Payer: BC Managed Care – PPO | Admitting: Physician Assistant

## 2017-08-07 ENCOUNTER — Encounter: Payer: Self-pay | Admitting: Physician Assistant

## 2017-08-07 ENCOUNTER — Other Ambulatory Visit: Payer: Self-pay

## 2017-08-07 VITALS — BP 130/84 | HR 76 | Temp 98.0°F | Resp 16 | Ht 73.0 in | Wt 211.0 lb

## 2017-08-07 DIAGNOSIS — E559 Vitamin D deficiency, unspecified: Secondary | ICD-10-CM

## 2017-08-07 DIAGNOSIS — K581 Irritable bowel syndrome with constipation: Secondary | ICD-10-CM

## 2017-08-07 LAB — COMPREHENSIVE METABOLIC PANEL
ALBUMIN: 4.5 g/dL (ref 3.5–5.2)
ALK PHOS: 74 U/L (ref 39–117)
ALT: 20 U/L (ref 0–53)
AST: 15 U/L (ref 0–37)
BILIRUBIN TOTAL: 0.6 mg/dL (ref 0.2–1.2)
BUN: 8 mg/dL (ref 6–23)
CALCIUM: 10.1 mg/dL (ref 8.4–10.5)
CO2: 32 meq/L (ref 19–32)
Chloride: 101 mEq/L (ref 96–112)
Creatinine, Ser: 0.91 mg/dL (ref 0.40–1.50)
GFR: 96.56 mL/min (ref >60.00)
Glucose, Bld: 85 mg/dL (ref 70–99)
Potassium: 4.3 mEq/L (ref 3.5–5.1)
Sodium: 138 mEq/L (ref 135–145)
TOTAL PROTEIN: 7.2 g/dL (ref 6.0–8.3)

## 2017-08-07 LAB — TSH: TSH: 1.17 u[IU]/mL (ref 0.35–4.50)

## 2017-08-07 LAB — VITAMIN B12: Vitamin B-12: 1074 pg/mL — ABNORMAL HIGH (ref 211–911)

## 2017-08-07 LAB — VITAMIN D 25 HYDROXY (VIT D DEFICIENCY, FRACTURES): VITD: 40.3 ng/mL (ref 30.00–100.00)

## 2017-08-07 NOTE — Progress Notes (Signed)
Patient presents to clinic today c/o constipation over the past several weeks without change in diet or activity level. Notes bloating and belching without heart burn or indigestion. Some occasional nausea without vomiting. Denies melena, hematochezia or tenesmus. Has history of IBS with constipation, previously on Amitiza but has not had issue in quite some time. Also noting continued fatigue despite supplementation for Vit D and b12 deficiency.   Past Medical History:  Diagnosis Date  . Asthma   . Bulging lumbar disc   . Depression   . GERD (gastroesophageal reflux disease)   . History of chicken pox     Current Outpatient Medications on File Prior to Visit  Medication Sig Dispense Refill  . albuterol (PROVENTIL HFA;VENTOLIN HFA) 108 (90 Base) MCG/ACT inhaler Inhale 2 puffs into the lungs every 6 (six) hours as needed for wheezing or shortness of breath. 1 Inhaler 3  . cholecalciferol (VITAMIN D) 1000 units tablet Take 2 tablets (2,000 Units total) by mouth daily. 30 tablet 0  . eszopiclone (LUNESTA) 1 MG TABS tablet TAKE 1 TABLET BY MOUTH AT BEDTIME AS NEEDED FOR SLEEP. TAKE IMMEDIATELY BEFORE BEDTIME 30 tablet 2  . fluticasone (FLONASE) 50 MCG/ACT nasal spray Place 2 sprays into both nostrils daily. 16 g 6  . Melatonin 5 MG TABS Take by mouth.    . pantoprazole (PROTONIX) 40 MG tablet Take 1 tablet (40 mg total) by mouth daily. 30 tablet 3  . ranitidine (ZANTAC) 300 MG tablet TAKE 1 TABLET BY MOUTH EVERYDAY AT BEDTIME 90 tablet 1  . sulindac (CLINORIL) 200 MG tablet TAKE 1 TABLET BY MOUTH TWICE A DAY AS NEEDED 60 tablet 1  . vitamin B-12 (CYANOCOBALAMIN) 1000 MCG tablet Take 1 tablet (1,000 mcg total) by mouth daily. 30 tablet 0   No current facility-administered medications on file prior to visit.     Allergies  Allergen Reactions  . Hydrocodone Itching and Other (See Comments)    Hypersensitivity with whole pill    Family History  Problem Relation Age of Onset  . Healthy  Mother        Living  . Healthy Father        Living  . Thyroid disease Brother   . Asthma Daughter     Social History   Socioeconomic History  . Marital status: Married    Spouse name: None  . Number of children: None  . Years of education: None  . Highest education level: None  Social Needs  . Financial resource strain: None  . Food insecurity - worry: None  . Food insecurity - inability: None  . Transportation needs - medical: None  . Transportation needs - non-medical: None  Occupational History  . None  Tobacco Use  . Smoking status: Never Smoker  . Smokeless tobacco: Never Used  Substance and Sexual Activity  . Alcohol use: No    Alcohol/week: 0.0 oz  . Drug use: No  . Sexual activity: Yes    Partners: Female  Other Topics Concern  . None  Social History Narrative   He live with wife and youngest daughter.   He works as a [employer] fast streamer.   Highest level of education:  College, BS    Review of Systems - See HPI.  All other ROS are negative.  BP 130/84   Pulse 76   Temp 98 F (36.7 C) (Oral)   Resp 16   Ht 6' 1"  (1.854 m)   Wt 211 lb (95.7 kg)   SpO2  97%   BMI 27.84 kg/m   Physical Exam  Constitutional: He is oriented to person, place, and time and well-developed, well-nourished, and in no distress.  HENT:  Head: Normocephalic and atraumatic.  Eyes: Conjunctivae are normal.  Neck: Neck supple.  Cardiovascular: Normal rate, regular rhythm, normal heart sounds and intact distal pulses.  Pulmonary/Chest: Effort normal and breath sounds normal. No respiratory distress. He has no wheezes. He has no rales. He exhibits no tenderness.  Abdominal: Soft. Bowel sounds are normal. He exhibits no distension. There is no tenderness. There is no rebound and no guarding.  Neurological: He is alert and oriented to person, place, and time.  Skin: Skin is warm and dry. No rash noted.  Psychiatric: Affect normal.  Vitals reviewed.  Recent Results (from the past 2160  hour(s))  CBC w/Diff     Status: Abnormal   Collection Time: 05/10/17 10:20 AM  Result Value Ref Range   WBC 12.7 (H) 4.0 - 10.5 K/uL   RBC 5.24 4.22 - 5.81 Mil/uL   Hemoglobin 15.6 13.0 - 17.0 g/dL   HCT 47.2 39.0 - 52.0 %   MCV 90.1 78.0 - 100.0 fl   MCHC 33.0 30.0 - 36.0 g/dL   RDW 13.1 11.5 - 15.5 %   Platelets 320.0 150.0 - 400.0 K/uL   Neutrophils Relative % 70.2 43.0 - 77.0 %   Lymphocytes Relative 20.4 12.0 - 46.0 %   Monocytes Relative 9.0 3.0 - 12.0 %   Eosinophils Relative 0.3 0.0 - 5.0 %   Basophils Relative 0.1 0.0 - 3.0 %   Neutro Abs 8.9 (H) 1.4 - 7.7 K/uL   Lymphs Abs 2.6 0.7 - 4.0 K/uL   Monocytes Absolute 1.1 (H) 0.1 - 1.0 K/uL   Eosinophils Absolute 0.0 0.0 - 0.7 K/uL   Basophils Absolute 0.0 0.0 - 0.1 K/uL  Comp Met (CMET)     Status: None   Collection Time: 05/10/17 10:20 AM  Result Value Ref Range   Sodium 137 135 - 145 mEq/L   Potassium 4.1 3.5 - 5.1 mEq/L   Chloride 100 96 - 112 mEq/L   CO2 30 19 - 32 mEq/L   Glucose, Bld 80 70 - 99 mg/dL   BUN 14 6 - 23 mg/dL   Creatinine, Ser 0.97 0.40 - 1.50 mg/dL   Total Bilirubin 0.5 0.2 - 1.2 mg/dL   Alkaline Phosphatase 70 39 - 117 U/L   AST 12 0 - 37 U/L   ALT 21 0 - 53 U/L   Total Protein 6.9 6.0 - 8.3 g/dL   Albumin 4.6 3.5 - 5.2 g/dL   Calcium 9.4 8.4 - 10.5 mg/dL   GFR 89.80 >60.00 mL/min  TSH     Status: None   Collection Time: 05/10/17 10:20 AM  Result Value Ref Range   TSH 1.71 0.35 - 4.50 uIU/mL  B12     Status: Abnormal   Collection Time: 05/10/17 10:20 AM  Result Value Ref Range   Vitamin B-12 196 (L) 211 - 911 pg/mL  Vitamin D (25 hydroxy)     Status: Abnormal   Collection Time: 05/10/17 10:20 AM  Result Value Ref Range   VITD 26.69 (L) 30.00 - 100.00 ng/mL  Sedimentation rate     Status: None   Collection Time: 05/10/17 10:20 AM  Result Value Ref Range   Sed Rate 1 0 - 15 mm/hr  Antinuclear Antib (ANA)     Status: None   Collection Time: 05/10/17 10:20 AM  Result Value Ref Range    Anit Nuclear Antibody(ANA) NEGATIVE NEGATIVE    Comment: ANA IFA is a first line screen for detecting the presence of up to approximately 150 autoantibodies in various autoimmune diseases. A negative ANA IFA result suggests ANA-associated autoimmune diseases are not present at this time. . Visit Physician FAQs for interpretation of all antibodies in the [place], prevalence, and association with diseases at http://education.[facility].com/ [email or web address] .   B. burgdorfi antibodies     Status: None   Collection Time: 05/10/17 10:20 AM  Result Value Ref Range   B burgdorferi Ab IgG+IgM <0.90 index    Comment:                    Index                Interpretation                    -----                --------------                    < 0.90               Negative                    0.90-1.09            Equivocal                    > 1.09               Positive . As recommended by the Food and Drug Administration  (FDA), all samples with positive or equivocal  results in a Borrelia burgdorferi antibody screen will be tested using a blot method. Positive or  equivocal screening test results should not be  interpreted as truly positive until verified as such  using a supplemental assay (e.g., B. burgdorferi blot). . The screening test and/or blot for B. burgdorferi  antibodies may be falsely negative in early stages of Lyme disease, including the period when erythema  migrans is apparent. .   H. pylori antibody, IgG     Status: None   Collection Time: 05/10/17 10:20 AM  Result Value Ref Range   H Pylori IgG Negative Negative  VITAMIN D 25 Hydroxy (Vit-D Deficiency, Fractures)     Status: None   Collection Time: 06/29/17  9:05 AM  Result Value Ref Range   VITD 43.24 30.00 - 100.00 ng/mL  Vitamin B12     Status: Abnormal   Collection Time: 06/29/17  9:05 AM  Result Value Ref Range   Vitamin B-12 1,457 (H) 211 - 911 pg/mL  CBC with Differential/Platelet     Status: None     Collection Time: 06/29/17  9:05 AM  Result Value Ref Range   WBC 7.7 4.0 - 10.5 K/uL   RBC 5.21 4.22 - 5.81 Mil/uL   Hemoglobin 15.7 13.0 - 17.0 g/dL   HCT 47.6 39.0 - 52.0 %   MCV 91.3 78.0 - 100.0 fl   MCHC 33.0 30.0 - 36.0 g/dL   RDW 13.8 11.5 - 15.5 %   Platelets 292.0 150.0 - 400.0 K/uL   Neutrophils Relative % 57.6 43.0 - 77.0 %   Lymphocytes Relative 31.0 12.0 - 46.0 %   Monocytes Relative 9.7 3.0 - 12.0 %   Eosinophils Relative 1.3 0.0 - 5.0 %  Basophils Relative 0.4 0.0 - 3.0 %   Neutro Abs 4.4 1.4 - 7.7 K/uL   Lymphs Abs 2.4 0.7 - 4.0 K/uL   Monocytes Absolute 0.7 0.1 - 1.0 K/uL   Eosinophils Absolute 0.1 0.0 - 0.7 K/uL   Basophils Absolute 0.0 0.0 - 0.1 K/uL    Assessment/Plan: 1. Irritable bowel syndrome with constipation Will check labs today to rule out other causes. Bowel regimen reviewed. If labs negative and symptoms are not improving with bowel regimen, will start trial of Linzess.  - TSH - Comp Met (CMET) - Tissue transglutaminase, IgA; Future - Tissue transglutaminase, IgA  2. Vitamin D deficiency Repeat labs today. Will also check b12 giving history. Patient is taking supplements as directed. - Vitamin D (25 hydroxy) - B12   Leeanne Rio, PA-C

## 2017-08-07 NOTE — Patient Instructions (Signed)
Please go to the lab today for blood work.  I will call you with your results. We will alter treatment regimen(s) if indicated by your results.   I encourage you to increase hydration and the amount of fiber in your diet.  Start a daily probiotic (Align, Culturelle, Digestive Advantage, etc.). Take 2 Tbs of Milk of Magnesia in a 4 oz glass of warmed prune juice every 2-3 days to help promote bowel movement. If no results within 24 hours, then repeat above regimen, adding a Dulcolax stool softener to regimen.  If labs are stable and symptoms are not improving with starting regimen above, we will add on Linzess (similar to Amitiza) to help with chronic constipation. I want to make sure there is not another cause of the constipation.  Keep a well-balanced diet and increase hydration. If you are not hydrating well, it will be harder to pass stool.   I am setting you up for a physical therapy assessment and treatment.  Please keep your phone on.

## 2017-08-08 LAB — TISSUE TRANSGLUTAMINASE, IGA: (TTG) AB, IGA: 1 U/mL

## 2017-08-09 ENCOUNTER — Other Ambulatory Visit: Payer: Self-pay | Admitting: *Deleted

## 2017-08-09 MED ORDER — LINACLOTIDE 145 MCG PO CAPS: 145.0000 ug | ORAL_CAPSULE | Freq: Every day | ORAL | 1 refills | Status: DC

## 2017-08-29 ENCOUNTER — Other Ambulatory Visit: Payer: Self-pay | Admitting: Physician Assistant

## 2017-08-30 ENCOUNTER — Other Ambulatory Visit: Payer: Self-pay | Admitting: Physician Assistant

## 2017-08-30 DIAGNOSIS — E559 Vitamin D deficiency, unspecified: Secondary | ICD-10-CM

## 2017-08-30 NOTE — Telephone Encounter (Signed)
Last OV 08/07/17 Vitamin d last filled 05/11/17 #12 with 0

## 2017-08-31 MED ORDER — VITAMIN D3 25 MCG (1000 UNIT) PO TABS: 2000.0000 [IU] | ORAL_TABLET | Freq: Every day | ORAL | 5 refills | Status: DC

## 2017-09-03 ENCOUNTER — Other Ambulatory Visit: Payer: Self-pay | Admitting: Physician Assistant

## 2017-09-18 ENCOUNTER — Telehealth: Payer: Self-pay | Admitting: Physician Assistant

## 2017-09-18 DIAGNOSIS — R5382 Chronic fatigue, unspecified: Secondary | ICD-10-CM

## 2017-09-18 NOTE — Telephone Encounter (Signed)
Copied from [place] [id number]. Topic: Quick Communication - See Telephone Encounter >> Sep 18, 2017 12:10 PM Ether Griffins B wrote: CRM for notification. See Telephone encounter for: 09/18/17.  Pt is requesting his vitamin D blood work be rechecked. He is feeling lack of energy cloudy headed again and just would like to check his levels. Since he has run out of the medication and hasnt taken it in a while.

## 2017-09-18 NOTE — Telephone Encounter (Signed)
Spoke with patient. He is taking his Vit D daily but still wonders if it is low. He is aware that insurance may not cover due to recently having this drawn and it being normal.   He is going to call his insurance company and see if they will approve it.  If so, he will call to schedule a lab appointment.   Order is being placed so that if patient decides to have this done it is ready to go and he can schedule appointment.

## 2017-11-21 ENCOUNTER — Ambulatory Visit: Payer: BC Managed Care – PPO | Admitting: Medical

## 2017-11-30 ENCOUNTER — Other Ambulatory Visit: Payer: Self-pay | Admitting: Physician Assistant

## 2017-12-02 ENCOUNTER — Other Ambulatory Visit: Payer: Self-pay | Admitting: Physician Assistant

## 2017-12-03 ENCOUNTER — Other Ambulatory Visit: Payer: Self-pay | Admitting: Physician Assistant

## 2017-12-05 NOTE — Telephone Encounter (Signed)
Lunesta last filled 09/04/17 #30 2RF Last OV 08/07/17

## 2017-12-18 ENCOUNTER — Other Ambulatory Visit: Payer: Self-pay

## 2017-12-18 ENCOUNTER — Ambulatory Visit: Payer: BC Managed Care – PPO | Admitting: Physician Assistant

## 2017-12-18 ENCOUNTER — Ambulatory Visit (INDEPENDENT_AMBULATORY_CARE_PROVIDER_SITE_OTHER): Payer: BC Managed Care – PPO

## 2017-12-18 ENCOUNTER — Encounter: Payer: Self-pay | Admitting: Physician Assistant

## 2017-12-18 VITALS — BP 134/90 | HR 94 | Temp 98.1°F | Resp 16 | Ht 73.0 in | Wt 215.4 lb

## 2017-12-18 DIAGNOSIS — M542 Cervicalgia: Secondary | ICD-10-CM

## 2017-12-18 MED ORDER — TIZANIDINE HCL 4 MG PO TABS: 4.0000 mg | ORAL_TABLET | Freq: Four times a day (QID) | ORAL | 0 refills | Status: DC | PRN

## 2017-12-18 NOTE — Patient Instructions (Signed)
Please go to the [facility] office for x-ray. I will call you with your results. Keep up with the massage therapist appointments.  Use the Tizanidine nightly. You can use during the day as well when not working or operating heavy machinery.  Please schedule an appointment with eye doctor.  Limit Ibuprofen. Try alternating with Tylenol or Tylenol-arthritis.

## 2017-12-18 NOTE — Progress Notes (Signed)
Kyle Miller is a 42 y.o. male  Shoulder Pain: Patient complaints of bilateral shoulder pain and posterior cervical pain.  The pain is described as aching and tight band.  The onset of the pain was gradual, starting about 2 years ago. has been very minimal but now worsening over the past couple of months. Denies any known trauma or injury.  The pain occurs several times per week and lasts 1-2 days.  Location is posterior. No history of dislocation. Symptoms are aggravated by work at or above shoulder height. Symptoms are diminisohed by  medication: muscle relaxant.   Limited activities include: no limitations. No stiffness is reported. 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   Past Medical History:  Diagnosis Date  . Asthma   . Bulging lumbar disc   . Depression   . GERD (gastroesophageal reflux disease)   . History of chicken pox    Past Surgical History:  Procedure Laterality Date  . WISDOM TOOTH EXTRACTION      Current Outpatient Medications:  .  albuterol (PROVENTIL HFA;VENTOLIN HFA) 108 (90 Base) MCG/ACT inhaler, Inhale 2 puffs into the lungs every 6 (six) hours as needed for wheezing or shortness of breath., Disp: 1 Inhaler, Rfl: 3 .  cholecalciferol (VITAMIN D) 1000 units tablet, Take 2 tablets (2,000 Units total) by mouth daily., Disp: 60 tablet, Rfl: 5 .  eszopiclone (LUNESTA) 1 MG TABS tablet, TAKE 1 TABLET BY MOUTH AT BEDTIME AS NEEDED FOR SLEEP. TAKE IMMEDIATELY BEFORE BEDTIME, Disp: 30 tablet, Rfl: 2 .  fluticasone (FLONASE) 50 MCG/ACT nasal spray, Place 2 sprays into both nostrils daily., Disp: 16 g, Rfl: 6 .  Melatonin 5 MG TABS, Take by mouth., Disp: , Rfl:  .  pantoprazole (PROTONIX) 40 MG tablet, TAKE 1 TABLET BY MOUTH EVERY DAY, Disp: 90 tablet, Rfl: 1 .  ranitidine (ZANTAC) 300 MG tablet, TAKE 1 TABLET BY MOUTH EVERYDAY AT BEDTIME (Patient not taking: Reported on  12/18/2017), Disp: 90 tablet, Rfl: 1 .  sulindac (CLINORIL) 200 MG tablet, TAKE 1 TABLET BY MOUTH TWICE A DAY AS NEEDED (Patient not taking: Reported on 12/18/2017), Disp: 60 tablet, Rfl: 1 .  vitamin B-12 (CYANOCOBALAMIN) 1000 MCG tablet, Take 1 tablet (1,000 mcg total) by mouth daily. (Patient not taking: Reported on 12/18/2017), Disp: 30 tablet, Rfl: 0 Allergies  Allergen Reactions  . Hydrocodone Itching and Other (See Comments)    Hypersensitivity with whole pill    reports that he has never smoked. He has never used smokeless tobacco. He reports that he does not drink alcohol or use drugs. Family History  Problem Relation Age of Onset  . Healthy Mother        Living  . Healthy Father        Living  . Thyroid disease Brother   . Asthma Daughter    BP 134/90   Pulse 94   Temp 98.1 F (36.7 C) (Oral)   Resp 16   Ht 6\' 1"  (1.854 m)   Wt 215 lb 6.4 oz (97.7 kg)   SpO2 99%   BMI 28.42 kg/m    General appearance: alert, cooperative and no distress Neck: no adenopathy, no carotid bruit, no JVD, supple, symmetrical, trachea midline and thyroid not enlarged, symmetric, no tenderness/mass/nodules. There is tightness and spasm of bilateral trapezius muscles noted on examination. Back: no tenderness to percussion or palpation, range of motion normal, symmetric,  no curvature. ROM normal. No CVA tenderness. Lungs: clear to auscultation bilaterally Heart: regular rate and rhythm, S1, S2 normal, no murmur, click, rub or gallop Extremities: extremities normal, atraumatic, no cyanosis or edema Shoulder: Inspection reveals no abnormalities, atrophy or asymmetry.Palpation is normal with no tenderness over AC joint or bicipital groove. ROM is full in all planes. Rotator cuff strength normal throughout. No signs of impingement No labral pathology noted with negative Obrien's, negative clunk and good stability. Normal scapular function observed.   1. Cervicalgia Long-standing and deteriorating.  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 Concern for nerve involvement in addition to muscular tension. Start Tizanidine. Continue massage therapy. Will obtain x-ray today to further assess. May need MRI in the near future if x-ray unremarkable. . - DG Cervical Spine Complete; Future

## 2018-01-02 ENCOUNTER — Encounter: Payer: Self-pay | Admitting: Physician Assistant

## 2018-01-02 DIAGNOSIS — R0681 Apnea, not elsewhere classified: Secondary | ICD-10-CM

## 2018-01-02 DIAGNOSIS — R5382 Chronic fatigue, unspecified: Secondary | ICD-10-CM

## 2018-01-03 NOTE — Telephone Encounter (Signed)
See MyChart message. Will you call patient to help him schedule lab appointment.

## 2018-01-03 NOTE — Addendum Note (Signed)
Addended by: Brunetta Jeans on: 01/03/2018 10:09 AM   Modules accepted: Orders

## 2018-01-07 ENCOUNTER — Other Ambulatory Visit (INDEPENDENT_AMBULATORY_CARE_PROVIDER_SITE_OTHER): Payer: BC Managed Care – PPO

## 2018-01-07 DIAGNOSIS — R5382 Chronic fatigue, unspecified: Secondary | ICD-10-CM | POA: Diagnosis not present

## 2018-01-07 LAB — CBC WITH DIFFERENTIAL/PLATELET
BASOS ABS: 0.1 10*3/uL (ref 0.0–0.1)
Basophils Relative: 0.8 % (ref 0.0–3.0)
EOS PCT: 3.9 % (ref 0.0–5.0)
Eosinophils Absolute: 0.3 10*3/uL (ref 0.0–0.7)
HCT: 44.5 % (ref 39.0–52.0)
Hemoglobin: 15.1 g/dL (ref 13.0–17.0)
LYMPHS ABS: 2.7 10*3/uL (ref 0.7–4.0)
LYMPHS PCT: 37.6 % (ref 12.0–46.0)
MCHC: 33.8 g/dL (ref 30.0–36.0)
MCV: 90 fl (ref 78.0–100.0)
MONO ABS: 0.7 10*3/uL (ref 0.1–1.0)
MONOS PCT: 9 % (ref 3.0–12.0)
NEUTROS ABS: 3.5 10*3/uL (ref 1.4–7.7)
NEUTROS PCT: 48.7 % (ref 43.0–77.0)
PLATELETS: 287 10*3/uL (ref 150.0–400.0)
RBC: 4.94 Mil/uL (ref 4.22–5.81)
RDW: 13.1 % (ref 11.5–15.5)
WBC: 7.3 10*3/uL (ref 4.0–10.5)

## 2018-01-07 LAB — COMPREHENSIVE METABOLIC PANEL
ALK PHOS: 70 U/L (ref 39–117)
ALT: 26 U/L (ref 0–53)
AST: 17 U/L (ref 0–37)
Albumin: 4.5 g/dL (ref 3.5–5.2)
BUN: 12 mg/dL (ref 6–23)
CO2: 31 mEq/L (ref 19–32)
Calcium: 9.9 mg/dL (ref 8.4–10.5)
Chloride: 100 mEq/L (ref 96–112)
Creatinine, Ser: 1 mg/dL (ref 0.40–1.50)
GFR: 86.43 mL/min (ref >60.00)
GLUCOSE: 92 mg/dL (ref 70–99)
POTASSIUM: 4.4 meq/L (ref 3.5–5.1)
SODIUM: 142 meq/L (ref 135–145)
TOTAL PROTEIN: 6.8 g/dL (ref 6.0–8.3)
Total Bilirubin: 0.5 mg/dL (ref 0.2–1.2)

## 2018-01-07 LAB — VITAMIN D 25 HYDROXY (VIT D DEFICIENCY, FRACTURES): VITD: 38.06 ng/mL (ref 30.00–100.00)

## 2018-01-07 LAB — HIGH SENSITIVITY CRP: CRP, High Sensitivity: 0.96 mg/L (ref 0.000–5.000)

## 2018-01-07 LAB — TSH: TSH: 1.27 u[IU]/mL (ref 0.35–4.50)

## 2018-02-19 ENCOUNTER — Other Ambulatory Visit: Payer: Self-pay | Admitting: Physician Assistant

## 2018-02-19 DIAGNOSIS — R0681 Apnea, not elsewhere classified: Secondary | ICD-10-CM

## 2018-03-12 ENCOUNTER — Other Ambulatory Visit: Payer: Self-pay | Admitting: Physician Assistant

## 2018-03-25 ENCOUNTER — Other Ambulatory Visit: Payer: Self-pay | Admitting: Cardiology

## 2018-03-27 ENCOUNTER — Encounter: Payer: Self-pay | Admitting: Physician Assistant

## 2018-03-29 ENCOUNTER — Other Ambulatory Visit: Payer: Self-pay | Admitting: Physician Assistant

## 2018-03-29 MED ORDER — CYCLOBENZAPRINE HCL 10 MG PO TABS: 10.0000 mg | ORAL_TABLET | Freq: Three times a day (TID) | ORAL | 0 refills | Status: DC | PRN

## 2018-03-29 NOTE — Telephone Encounter (Signed)
Call placed to patient. Left VM to call office. Pt requesting Flexeril RX.  Pt has left My Chart message about this medication on 03/27/18. He will need to schedule appointment.

## 2018-03-29 NOTE — Telephone Encounter (Signed)
Requested medication (s) are due for refill today: yes  Requested medication (s) are on the active medication list: no  Last refill:  12/12/16  Future visit scheduled: no  Notes to clinic:  See My Chart message 03/27/18    Requested Prescriptions  Pending Prescriptions Disp Refills   cyclobenzaprine (FLEXERIL) 5 MG tablet 90 tablet 1    Sig: Take 1 tablet (5 mg total) by mouth 3 (three) times daily as needed for muscle spasms.     Not Delegated - Analgesics:  Muscle Relaxants Failed - 03/29/2018  3:55 PM      Failed - This refill cannot be delegated      Passed - Valid encounter within last 6 months    Recent Outpatient Visits          3 months ago [facility] [place], [place], [place]   7 months ago Irritable bowel syndrome with constipation   [facility] [place], [place], [place]   10 months ago Chronic fatigue   [facility] [place], [place], [place]   1 year ago Otitis externa of left ear, unspecified chronicity, unspecified type   [facility] [place], PA-C   1 year ago Acute otitis externa of left ear, unspecified type   [facility] [place], [place]

## 2018-03-29 NOTE — Telephone Encounter (Signed)
Copied from [place] [phone]. Topic: Quick Communication - Rx Refill/Question >> Mar 29, 2018  3:20 PM Wynetta Emery, [place] C wrote: Medication: cyclobenzaprine 5mg    Has the patient contacted their pharmacy? yes  (Agent: If no, request that the patient contact the pharmacy for the refill.) (Agent: If yes, when and what did the pharmacy advise?)  Preferred Pharmacy (with phone number or street name): [id number] - [place], [place] [phone] (Phone) [phone] (Fax)    Agent: Please be advised that RX refills may take up to 3 business days. We ask that you follow-up with your pharmacy.

## 2018-04-05 ENCOUNTER — Ambulatory Visit: Payer: BC Managed Care – PPO | Admitting: Physician Assistant

## 2018-05-20 ENCOUNTER — Other Ambulatory Visit: Payer: Self-pay

## 2018-05-20 ENCOUNTER — Encounter: Payer: Self-pay | Admitting: Physician Assistant

## 2018-05-20 ENCOUNTER — Ambulatory Visit: Payer: BC Managed Care – PPO | Admitting: Physician Assistant

## 2018-05-20 VITALS — BP 136/88 | HR 75 | Temp 97.7°F | Resp 16 | Wt 215.0 lb

## 2018-05-20 DIAGNOSIS — F411 Generalized anxiety disorder: Secondary | ICD-10-CM

## 2018-05-20 MED ORDER — ESCITALOPRAM OXALATE 10 MG PO TABS: 10.0000 mg | ORAL_TABLET | Freq: Every day | ORAL | 5 refills | Status: DC

## 2018-05-20 NOTE — Patient Instructions (Signed)
Please start the Lexapro (Escitalopram), taking once daily as directed. Continue the Lunesta as directed. I recommend you download the Calm or HeadSpace app on your phone. This will help with mindfulness techniques to help reduce stress levels.  Follow-up in 4-6 weeks.  Return sooner if needed.

## 2018-05-20 NOTE — Progress Notes (Signed)
Patient presents to clinic today c/o generalized anxiety and worry noted over the past several months. Has remote history of anxiety but has always been able to manage on his own. 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 Notes this worry is affecting sleep as he runs through things over and over. Is taking Lunesta but only averaging 4-5 hours of sleep per night due to the worry. Denies panic attack. Denies depressed mood, anhedonia, SI/HI. Does not want to increase sleep aid. Would like to discuss options for anxiety.   Past Medical History:  Diagnosis Date  . Asthma   . Bulging lumbar disc   . Depression   . GERD (gastroesophageal reflux disease)   . History of chicken pox     Current Outpatient Medications on File Prior to Visit  Medication Sig Dispense Refill  . albuterol (PROVENTIL HFA;VENTOLIN HFA) 108 (90 Base) MCG/ACT inhaler Inhale 2 puffs into the lungs every 6 (six) hours as needed for wheezing or shortness of breath. 1 Inhaler 3  . cyclobenzaprine (FLEXERIL) 10 MG tablet Take 1 tablet (10 mg total) by mouth 3 (three) times daily as needed for muscle spasms. 30 tablet 0  . eszopiclone (LUNESTA) 1 MG TABS tablet TAKE 1 TABLET BY MOUTH AT BEDTIME AS NEEDED FOR SLEEP. TAKE IMMEDIATELY BEFORE BEDTIME 30 tablet 2  . Melatonin 5 MG TABS Take by mouth.    . pantoprazole (PROTONIX) 40 MG tablet TAKE 1 TABLET BY MOUTH EVERY DAY 90 tablet 1  . sulindac (CLINORIL) 200 MG tablet Take 200 mg by mouth 2 (two) times daily.     No current facility-administered medications on file prior to visit.     Allergies  Allergen Reactions  . Hydrocodone Itching and Other (See Comments)    Hypersensitivity with whole pill    Family History  Problem Relation Age of Onset  . Healthy Mother        Living  . Healthy Father        Living  . Thyroid disease Brother   . Asthma Daughter     Social History   Socioeconomic History  . Marital  status: Married    Spouse name: Not on file  . Number of children: Not on file  . Years of education: Not on file  . Highest education level: Not on file  Occupational History  . Not on file  Social Needs  . Financial resource strain: Not on file  . Food insecurity:    Worry: Not on file    Inability: Not on file  . Transportation needs:    Medical: Not on file    Non-medical: Not on file  Tobacco Use  . Smoking status: Never Smoker  . Smokeless tobacco: Never Used  Substance and Sexual Activity  . Alcohol use: No    Alcohol/week: 0.0 standard drinks  . Drug use: No  . Sexual activity: Yes    Partners: Female  Lifestyle  . Physical activity:    Days per week: Not on file    Minutes per session: Not on file  . Stress: Not on file  Relationships  . Social connections:    Talks on phone: Not on file    Gets together: Not on file    Attends religious service: Not on file    Active member of club or organization: Not on file    Attends meetings of clubs or organizations: Not on file  Relationship status: Not on file  Other Topics Concern  . Not on file  Social History Narrative   He live with wife and youngest daughter.   He works as a [employer] fast streamer.   Highest level of education:  College, BS   Review of Systems - See HPI.  All other ROS are negative.  BP 136/88   Pulse 75   Temp 97.7 F (36.5 C) (Oral)   Resp 16   Wt 215 lb (97.5 kg)   SpO2 98%   BMI 28.37 kg/m   Physical Exam Vitals signs reviewed.  Constitutional:      Appearance: Normal appearance.  HENT:     Head: Normocephalic and atraumatic.  Neck:     Musculoskeletal: Neck supple.  Cardiovascular:     Rate and Rhythm: Normal rate and regular rhythm.     Pulses: Normal pulses.     Heart sounds: Normal heart sounds.  Pulmonary:     Effort: Pulmonary effort is normal.     Breath sounds: Normal breath sounds.  Neurological:     Mental Status: He is alert.  Psychiatric:        Mood and Affect:  Mood normal.    Assessment/Plan: Generalized anxiety disorder Supportive measures and stress relief tactics reviewed. Recommended he download the Calm or Headspace app to work on mindfulness techniques. After discussion of medication options, we will start a 10 mg dose of Lexapro. Close follow-up scheduled.     Leeanne Rio, PA-C

## 2018-05-20 NOTE — Assessment & Plan Note (Signed)
Supportive measures and stress relief tactics reviewed. Recommended he download the Calm or Headspace app to work on mindfulness techniques. After discussion of medication options, we will start a 10 mg dose of Lexapro. Close follow-up scheduled.

## 2018-06-12 ENCOUNTER — Other Ambulatory Visit: Payer: Self-pay | Admitting: Physician Assistant

## 2018-06-12 NOTE — Telephone Encounter (Signed)
Due for follow up in 4-6 weeks for recheck Anxiety

## 2018-06-14 ENCOUNTER — Encounter: Payer: Self-pay | Admitting: Emergency Medicine

## 2018-06-16 ENCOUNTER — Other Ambulatory Visit: Payer: Self-pay | Admitting: Physician Assistant

## 2018-06-16 DIAGNOSIS — G4700 Insomnia, unspecified: Secondary | ICD-10-CM

## 2018-06-17 NOTE — Telephone Encounter (Signed)
Last rx filled on 03/13/18 #30 2 RF  Please advise

## 2018-08-06 ENCOUNTER — Other Ambulatory Visit: Payer: Self-pay | Admitting: Physician Assistant

## 2018-08-26 ENCOUNTER — Encounter: Payer: Self-pay | Admitting: Physician Assistant

## 2018-09-16 ENCOUNTER — Other Ambulatory Visit: Payer: Self-pay | Admitting: Physician Assistant

## 2018-09-16 DIAGNOSIS — G4700 Insomnia, unspecified: Secondary | ICD-10-CM

## 2018-09-17 NOTE — Telephone Encounter (Signed)
Lunesta last rx 06/17/18 #30 2 RF LOV: 05/20/18 for Anxiety  Please advise

## 2018-11-18 ENCOUNTER — Other Ambulatory Visit: Payer: Self-pay | Admitting: Physician Assistant

## 2018-11-18 DIAGNOSIS — G4700 Insomnia, unspecified: Secondary | ICD-10-CM

## 2018-11-19 ENCOUNTER — Encounter: Payer: Self-pay | Admitting: Emergency Medicine

## 2018-11-19 NOTE — Telephone Encounter (Signed)
My chart message sent to patient to schedule a CPE or Anxiety/Sleep follow up

## 2018-11-19 NOTE — Telephone Encounter (Signed)
Last OV 05/20/18 Anxiety Last rx for Lunesta 09/17/18 #30 1RF

## 2018-11-27 ENCOUNTER — Encounter: Payer: Self-pay | Admitting: Physician Assistant

## 2018-11-27 ENCOUNTER — Other Ambulatory Visit: Payer: Self-pay

## 2018-11-27 ENCOUNTER — Ambulatory Visit (INDEPENDENT_AMBULATORY_CARE_PROVIDER_SITE_OTHER): Payer: BC Managed Care – PPO | Admitting: Physician Assistant

## 2018-11-27 VITALS — BP 140/84 | HR 88 | Temp 100.1°F | Resp 16 | Ht 73.0 in | Wt 213.0 lb

## 2018-11-27 DIAGNOSIS — M5440 Lumbago with sciatica, unspecified side: Secondary | ICD-10-CM | POA: Diagnosis not present

## 2018-11-27 DIAGNOSIS — G4700 Insomnia, unspecified: Secondary | ICD-10-CM | POA: Diagnosis not present

## 2018-11-27 DIAGNOSIS — F411 Generalized anxiety disorder: Secondary | ICD-10-CM

## 2018-11-27 DIAGNOSIS — R5383 Other fatigue: Secondary | ICD-10-CM

## 2018-11-27 DIAGNOSIS — M545 Low back pain, unspecified: Secondary | ICD-10-CM

## 2018-11-27 DIAGNOSIS — Z0001 Encounter for general adult medical examination with abnormal findings: Secondary | ICD-10-CM | POA: Diagnosis not present

## 2018-11-27 DIAGNOSIS — Z0000 Encounter for general adult medical examination without abnormal findings: Secondary | ICD-10-CM

## 2018-11-27 MED ORDER — TEMAZEPAM 15 MG PO CAPS: 15.0000 mg | ORAL_CAPSULE | Freq: Every evening | ORAL | 0 refills | Status: DC | PRN

## 2018-11-27 NOTE — Patient Instructions (Signed)
Please go to the lab for blood work.  Our office will call you with your results unless you have chosen to receive results via MyChart. If your blood work is normal we will follow-up each year for physicals and as scheduled for chronic medical problems. If anything is abnormal we will treat accordingly and get you in for a follow-up.  Please stop the Lunesta and start the Restoril at night to help with sleep.  Review the sleep hygiene practices below Message me in 1 week to let me know how this is working.  We will hold of on starting another medication solely for mood. I am very hopeful that once we get you more restful/restorative sleep, mood will improve. You have got to work on setting boundaries between work and home! Consider downloading and using the Calm app from the app store on the phone to work on mindfulness training.   Sleep Hygiene  Do: (1) Go to bed at the same time each day. (2) Get up from bed at the same time each day. (3) Get regular exercise each day, preferably in the morning.  There is goof evidence that regular exercise improves restful sleep.  This includes stretching and aerobic exercise. (4) Get regular exposure to outdoor or bright lights, especially in the late afternoon. (5) Keep the temperature in your bedroom comfortable. (6) Keep the bedroom quiet when sleeping. (7) Keep the bedroom dark enough to facilitate sleep. (8) Use your bed only for sleep and sex. (9) Take medications as directed.  It is helpful to take prescribed sleeping pills 1 hour before bedtime, so they are causing drowsiness when you lie down, or 10 hours before getting up, to avoid daytime drowsiness. (10) Use a relaxation exercise just before going to sleep -- imagery, massage, warm bath. (11) Keep your feet and hands warm.  Wear warm socks and/or mittens or gloves to bed.  Don't: (1) Exercise just before going to bed. (2) Engage in stimulating activity just before bed, such as playing a  competitive game, watching an exciting program on television, or having an important discussion with a loved one. (3) Have caffeine in the evening (coffee, teas, chocolate, sodas, etc.) (4) Read or watch television in bed. (5) Use alcohol to help you sleep. (6) Go to bed too hungry or too full. (7) Take another person's sleeping pills. (8) Take over-the-counter sleeping pills, without your doctor's knowledge.  Tolerance can develop rapidly with these medications.  Diphenhydramine can have serious side effects for elderly patients. (9) Take daytime naps. (10) Command yourself to go to sleep.  This only makes your mind and body more alert.  If you lie awake for more than 20-30 minutes, get up, go to a different room, participate in a quiet activity (Ex - non-excitable reading or television), and then return to bed when you feel sleepy.  Do this as many times during the night as needed.  This may cause you to have a night or two of poor sleep but it will train your brain to know when it is time for sleep.

## 2018-11-27 NOTE — Progress Notes (Signed)
Patient presents to clinic today for annual exam.  Patient is fasting for labs.   Chronic Issues: Anxiety/Depression  -- Placed on Lexapro at last visit in December 2019. Endorses taking as directed but stopped as he felt as it was causing sexual dysfunction. Tried to wean down to QOD dosing but without change so notes he stopped medication and has been working on mood himself. Patient endorses no improvement in sexual dysfunction with stopping medication. Patient endorses high stress levels, mainly with work and family. Is constantly having to deal with work issues at all time of day. 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 Is in the process of trying to get Medicaid/Medicare for his father which is causing him some added stress and anxiety. Endorses not sleeping well even with Lunesta 1 mg nightly. Notes issue with both sleep onset and sleep maintenance. Notes significant fatigue throughout the day.  Has history of VIt D and B12 deficiency, previously on supplementation. Last check Vit D normal and B12 elevated from supplementation. Notes he is no longer on supplements. Patient denies chest pain, palpitations, lightheadedness, dizziness, vision changes or frequent headaches.   Diet/Exercise: 3 meals a day, Keto diet for 6-7wks  Past Medical History:  Diagnosis Date  . Asthma   . Bulging lumbar disc   . Depression   . GERD (gastroesophageal reflux disease)   . History of chicken pox     Past Surgical History:  Procedure Laterality Date  . WISDOM TOOTH EXTRACTION      Current Outpatient Medications on File Prior to Visit  Medication Sig Dispense Refill  . albuterol (PROVENTIL HFA;VENTOLIN HFA) 108 (90 Base) MCG/ACT inhaler TAKE 2 PUFFS BY MOUTH EVERY 6 HOURS AS NEEDED FOR WHEEZE OR SHORTNESS OF BREATH 18 Inhaler 3  . cyclobenzaprine (FLEXERIL) 10 MG tablet Take 1 tablet (10 mg total) by mouth 3 (three)  times daily as needed for muscle spasms. 30 tablet 0  . escitalopram (LEXAPRO) 10 MG tablet TAKE 1 TABLET BY MOUTH EVERY DAY 90 tablet 0  . eszopiclone (LUNESTA) 1 MG TABS tablet TAKE 1 TABLET BY MOUTH AT BEDTIME AS NEEDED FOR SLEEP. TAKE IMMEDIATELY BEFORE BEDTIME 30 tablet 0  . Melatonin 5 MG TABS Take by mouth.    . pantoprazole (PROTONIX) 40 MG tablet TAKE 1 TABLET BY MOUTH EVERY DAY 90 tablet 1  . sulindac (CLINORIL) 200 MG tablet Take 200 mg by mouth 2 (two) times daily.     No current facility-administered medications on file prior to visit.     Allergies  Allergen Reactions  . Hydrocodone Itching and Other (See Comments)    Hypersensitivity with whole pill    Family History  Problem Relation Age of Onset  . Healthy Mother        Living  . Healthy Father        Living  . Thyroid disease Brother   . Asthma Daughter     Social History   Socioeconomic History  . Marital status: Married    Spouse name: Not on file  . Number of children: Not on file  . Years of education: Not on file  . Highest education level: Not on file  Occupational History  . Not on file  Social Needs  . Financial resource strain: Not on file  . Food insecurity    Worry: Not on file    Inability: Not on  file  . Transportation needs    Medical: Not on file    Non-medical: Not on file  Tobacco Use  . Smoking status: Never Smoker  . Smokeless tobacco: Never Used  Substance and Sexual Activity  . Alcohol use: No    Alcohol/week: 0.0 standard drinks  . Drug use: No  . Sexual activity: Yes    Partners: Female  Lifestyle  . Physical activity    Days per week: Not on file    Minutes per session: Not on file  . Stress: Not on file  Relationships  . Social Herbalist on phone: Not on file    Gets together: Not on file    Attends religious service: Not on file    Active member of club or organization: Not on file    Attends meetings of clubs or organizations: Not on file     Relationship status: Not on file  . Intimate partner violence    Fear of current or ex partner: Not on file    Emotionally abused: Not on file    Physically abused: Not on file    Forced sexual activity: Not on file  Other Topics Concern  . Not on file  Social History Narrative   He live with wife and youngest daughter.   He works as a [employer] fast streamer.   Highest level of education:  College, BS   Review of Systems  Constitutional: Negative for fever and weight loss.  HENT: Negative for ear discharge, ear pain, hearing loss and tinnitus.   Eyes: Negative for blurred vision, double vision, photophobia and pain.  Respiratory: Negative for cough and shortness of breath.   Cardiovascular: Negative for chest pain and palpitations.  Gastrointestinal: Negative for abdominal pain, blood in stool, constipation, diarrhea, heartburn, melena, nausea and vomiting.  Genitourinary: Negative for dysuria, flank pain, frequency, hematuria and urgency.  Musculoskeletal: Negative for falls.  Neurological: Negative for dizziness, loss of consciousness and headaches.  Endo/Heme/Allergies: Negative for environmental allergies.  Psychiatric/Behavioral: Negative for depression, hallucinations, substance abuse and suicidal ideas. The patient is not nervous/anxious and does not have insomnia.    There were no vitals taken for this visit.  Physical Exam Vitals signs reviewed.  Constitutional:      General: He is not in acute distress.    Appearance: He is well-developed. He is not diaphoretic.  HENT:     Head: Normocephalic and atraumatic.     Right Ear: Tympanic membrane, ear canal and external ear normal.     Left Ear: Tympanic membrane, ear canal and external ear normal.     Nose: Nose normal.     Mouth/Throat:     Pharynx: No posterior oropharyngeal erythema.  Eyes:     Conjunctiva/sclera: Conjunctivae normal.     Pupils: Pupils are equal, round, and reactive to light.  Neck:     Musculoskeletal: Neck  supple.     Thyroid: No thyromegaly.  Cardiovascular:     Rate and Rhythm: Normal rate and regular rhythm.     Heart sounds: Normal heart sounds.  Pulmonary:     Effort: Pulmonary effort is normal. No respiratory distress.     Breath sounds: Normal breath sounds. No wheezing or rales.  Chest:     Chest wall: No tenderness.  Abdominal:     General: Bowel sounds are normal. There is no distension.     Palpations: Abdomen is soft. There is no mass.     Tenderness: There is no  abdominal tenderness. There is no guarding or rebound.  Lymphadenopathy:     Cervical: No cervical adenopathy.  Skin:    General: Skin is warm and dry.     Findings: No rash.  Neurological:     Mental Status: He is alert and oriented to person, place, and time.     Cranial Nerves: No cranial nerve deficit.    Assessment/Plan: 1. Visit for preventive health examination Depression screen negative. Health Maintenance reviewed. Preventive schedule discussed and handout given in AVS. Will obtain fasting labs today. - CBC with Differential/Platelet - Comprehensive metabolic panel - Lipid panel - Hemoglobin A1c  2. Low back pain with radiation Continue care as directed by Neurosurgeon.  3. Insomnia, unspecified type Contributing to anxiety, stress and fatigue. Will attempt trial of Restoril 15 mg nightly. Sleep hygiene practices reviewed. Follow-up via MyChart in 1-2 weeks. - temazepam (RESTORIL) 15 MG capsule; Take 1 capsule (15 mg total) by mouth at bedtime as needed for sleep.  Dispense: 30 capsule; Refill: 0  4. Fatigue, unspecified type Will obtain lab panel today. Feel related to poor sleep and anxiety. Temazepam started as noted above.  - TSH - B12 and Folate Panel - Vitamin D (25 hydroxy)  5. Generalized anxiety disorder Discussed need for healthy boundaries between work and home life. He is going to work on this. Recommended CALM app on his phone to start working on mindfulness exercises. Declines  counseling. Declines starting another medicine for anxiety/mood at present. Follow-up discussed. - TSH   Leeanne Rio, PA-C

## 2018-11-28 LAB — LIPID PANEL
Cholesterol: 237 mg/dL — ABNORMAL HIGH (ref 0–200)
HDL: 48.6 mg/dL (ref >39.00)
LDL Cholesterol: 168 mg/dL — ABNORMAL HIGH (ref 0–99)
NonHDL: 188.69
Total CHOL/HDL Ratio: 5
Triglycerides: 101 mg/dL (ref 0.0–149.0)
VLDL: 20.2 mg/dL (ref 0.0–40.0)

## 2018-11-28 LAB — COMPREHENSIVE METABOLIC PANEL
ALT: 24 U/L (ref 0–53)
AST: 13 U/L (ref 0–37)
Albumin: 4.6 g/dL (ref 3.5–5.2)
Alkaline Phosphatase: 58 U/L (ref 39–117)
BUN: 15 mg/dL (ref 6–23)
CO2: 31 mEq/L (ref 19–32)
Calcium: 9.4 mg/dL (ref 8.4–10.5)
Chloride: 102 mEq/L (ref 96–112)
Creatinine, Ser: 0.97 mg/dL (ref 0.40–1.50)
GFR: 83.88 mL/min (ref >60.00)
Glucose, Bld: 91 mg/dL (ref 70–99)
Potassium: 3.8 mEq/L (ref 3.5–5.1)
Sodium: 140 mEq/L (ref 135–145)
Total Bilirubin: 0.3 mg/dL (ref 0.2–1.2)
Total Protein: 6.9 g/dL (ref 6.0–8.3)

## 2018-11-28 LAB — CBC WITH DIFFERENTIAL/PLATELET
Basophils Absolute: 0 10*3/uL (ref 0.0–0.1)
Basophils Relative: 0.5 % (ref 0.0–3.0)
Eosinophils Absolute: 0.1 10*3/uL (ref 0.0–0.7)
Eosinophils Relative: 1.2 % (ref 0.0–5.0)
HCT: 46.8 % (ref 39.0–52.0)
Hemoglobin: 15.3 g/dL (ref 13.0–17.0)
Lymphocytes Relative: 29.6 % (ref 12.0–46.0)
Lymphs Abs: 2.7 10*3/uL (ref 0.7–4.0)
MCHC: 32.8 g/dL (ref 30.0–36.0)
MCV: 90.4 fl (ref 78.0–100.0)
Monocytes Absolute: 0.8 10*3/uL (ref 0.1–1.0)
Monocytes Relative: 8.5 % (ref 3.0–12.0)
Neutro Abs: 5.5 10*3/uL (ref 1.4–7.7)
Neutrophils Relative %: 60.2 % (ref 43.0–77.0)
Platelets: 280 10*3/uL (ref 150.0–400.0)
RBC: 5.17 Mil/uL (ref 4.22–5.81)
RDW: 13.6 % (ref 11.5–15.5)
WBC: 9.1 10*3/uL (ref 4.0–10.5)

## 2018-11-28 LAB — B12 AND FOLATE PANEL
Folate: 15 ng/mL (ref >5.9)
Vitamin B-12: 416 pg/mL (ref 211–911)

## 2018-11-28 LAB — HEMOGLOBIN A1C: Hgb A1c MFr Bld: 5.6 % (ref 4.6–6.5)

## 2018-11-28 LAB — TSH: TSH: 0.65 u[IU]/mL (ref 0.35–4.50)

## 2018-11-28 LAB — VITAMIN D 25 HYDROXY (VIT D DEFICIENCY, FRACTURES): VITD: 38.07 ng/mL (ref 30.00–100.00)

## 2018-12-04 ENCOUNTER — Encounter: Payer: Self-pay | Admitting: Emergency Medicine

## 2018-12-18 ENCOUNTER — Encounter: Payer: Self-pay | Admitting: Physician Assistant

## 2018-12-18 DIAGNOSIS — H9313 Tinnitus, bilateral: Secondary | ICD-10-CM

## 2019-01-15 ENCOUNTER — Other Ambulatory Visit: Payer: Self-pay | Admitting: Physician Assistant

## 2019-01-15 DIAGNOSIS — G4700 Insomnia, unspecified: Secondary | ICD-10-CM

## 2019-01-16 NOTE — Telephone Encounter (Signed)
Last refill of Temazepam 11/27/18 #30 LOV: 11/27/18 CPE

## 2019-01-27 ENCOUNTER — Encounter: Payer: Self-pay | Admitting: Physician Assistant

## 2019-01-27 ENCOUNTER — Other Ambulatory Visit: Payer: Self-pay | Admitting: Physician Assistant

## 2019-03-24 ENCOUNTER — Other Ambulatory Visit: Payer: Self-pay | Admitting: Physician Assistant

## 2019-03-24 DIAGNOSIS — G4700 Insomnia, unspecified: Secondary | ICD-10-CM

## 2019-04-01 ENCOUNTER — Encounter: Payer: Self-pay | Admitting: Physician Assistant

## 2019-04-01 ENCOUNTER — Other Ambulatory Visit: Payer: Self-pay

## 2019-04-01 ENCOUNTER — Ambulatory Visit (INDEPENDENT_AMBULATORY_CARE_PROVIDER_SITE_OTHER): Payer: BC Managed Care – PPO | Admitting: Physician Assistant

## 2019-04-01 DIAGNOSIS — I10 Essential (primary) hypertension: Secondary | ICD-10-CM | POA: Diagnosis not present

## 2019-04-01 MED ORDER — LOSARTAN POTASSIUM 25 MG PO TABS: 25.0000 mg | ORAL_TABLET | Freq: Every day | ORAL | 1 refills | Status: DC

## 2019-04-01 NOTE — Patient Instructions (Signed)
Instructions sent to MyChart.   Please keep hydrated and get plenty of rest. Continue to follow low sodium diet.  See information below.   Take the losartan once daily as directed.  We will follow-up in office as scheduled in a couple of weeks to reassess BP.   Hang in there!   DASH Eating Plan DASH stands for "Dietary Approaches to Stop Hypertension." The DASH eating plan is a healthy eating plan that has been shown to reduce high blood pressure (hypertension). It may also reduce your risk for type 2 diabetes, heart disease, and stroke. The DASH eating plan may also help with weight loss. What are tips for following this plan?  General guidelines  Avoid eating more than 2,300 mg (milligrams) of salt (sodium) a day. If you have hypertension, you may need to reduce your sodium intake to 1,500 mg a day.  Limit alcohol intake to no more than 1 drink a day for nonpregnant women and 2 drinks a day for men. One drink equals 12 oz of beer, 5 oz of wine, or 1 oz of hard liquor.  Work with your health care provider to maintain a healthy body weight or to lose weight. Ask what an ideal weight is for you.  Get at least 30 minutes of exercise that causes your heart to beat faster (aerobic exercise) most days of the week. Activities may include walking, swimming, or biking.  Work with your health care provider or diet and nutrition specialist (dietitian) to adjust your eating plan to your individual calorie needs. Reading food labels   Check food labels for the amount of sodium per serving. Choose foods with less than 5 percent of the Daily Value of sodium. Generally, foods with less than 300 mg of sodium per serving fit into this eating plan.  To find whole grains, look for the word "whole" as the first word in the ingredient list. Shopping  Buy products labeled as "low-sodium" or "no salt added."  Buy fresh foods. Avoid canned foods and premade or frozen meals. Cooking  Avoid adding  salt when cooking. Use salt-free seasonings or herbs instead of table salt or sea salt. Check with your health care provider or pharmacist before using salt substitutes.  Do not fry foods. Cook foods using healthy methods such as baking, boiling, grilling, and broiling instead.  Cook with heart-healthy oils, such as olive, canola, soybean, or sunflower oil. Meal planning  Eat a balanced diet that includes: ? 5 or more servings of fruits and vegetables each day. At each meal, try to fill half of your plate with fruits and vegetables. ? Up to 6-8 servings of whole grains each day. ? Less than 6 oz of lean meat, poultry, or fish each day. A 3-oz serving of meat is about the same size as a deck of cards. One egg equals 1 oz. ? 2 servings of low-fat dairy each day. ? A serving of nuts, seeds, or beans 5 times each week. ? Heart-healthy fats. Healthy fats called Omega-3 fatty acids are found in foods such as flaxseeds and coldwater fish, like sardines, salmon, and mackerel.  Limit how much you eat of the following: ? Canned or prepackaged foods. ? Food that is high in trans fat, such as fried foods. ? Food that is high in saturated fat, such as fatty meat. ? Sweets, desserts, sugary drinks, and other foods with added sugar. ? Full-fat dairy products.  Do not salt foods before eating.  Try to eat at  least 2 vegetarian meals each week.  Eat more home-cooked food and less restaurant, buffet, and fast food.  When eating at a restaurant, ask that your food be prepared with less salt or no salt, if possible. What foods are recommended? The items listed may not be a complete list. Talk with your dietitian about what dietary choices are best for you. Grains Whole-grain or whole-wheat bread. Whole-grain or whole-wheat pasta. Brown rice. [place]. Bulgur. Whole-grain and low-sodium cereals. Pita bread. Low-fat, low-sodium crackers. Whole-wheat flour tortillas. Vegetables Fresh or frozen  vegetables (raw, steamed, roasted, or grilled). Low-sodium or reduced-sodium tomato and vegetable juice. Low-sodium or reduced-sodium tomato sauce and tomato paste. Low-sodium or reduced-sodium canned vegetables. Fruits All fresh, dried, or frozen fruit. Canned fruit in natural juice (without added sugar). Meat and other protein foods Skinless chicken or [place]. Ground chicken or [place]. Pork with fat trimmed off. Fish and seafood. Egg whites. Dried beans, peas, or lentils. Unsalted nuts, nut butters, and seeds. Unsalted canned beans. Lean cuts of beef with fat trimmed off. Low-sodium, lean deli meat. Dairy Low-fat (1%) or fat-free (skim) milk. Fat-free, low-fat, or reduced-fat cheeses. Nonfat, low-sodium ricotta or cottage cheese. Low-fat or nonfat yogurt. Low-fat, low-sodium cheese. Fats and oils Soft margarine without trans fats. Vegetable oil. Low-fat, reduced-fat, or light mayonnaise and salad dressings (reduced-sodium). Canola, safflower, olive, soybean, and sunflower oils. Avocado. Seasoning and other foods Herbs. Spices. Seasoning mixes without salt. Unsalted popcorn and pretzels. Fat-free sweets. What foods are not recommended? The items listed may not be a complete list. Talk with your dietitian about what dietary choices are best for you. Grains Baked goods made with fat, such as croissants, muffins, or some breads. Dry pasta or rice meal packs. Vegetables Creamed or fried vegetables. Vegetables in a cheese sauce. Regular canned vegetables (not low-sodium or reduced-sodium). Regular canned tomato sauce and paste (not low-sodium or reduced-sodium). Regular tomato and vegetable juice (not low-sodium or reduced-sodium). Angie Fava. Olives. Fruits Canned fruit in a light or heavy syrup. Fried fruit. Fruit in cream or butter sauce. Meat and other protein foods Fatty cuts of meat. Ribs. Fried meat. Berniece Salines. Sausage. Bologna and other processed lunch meats. Salami. Fatback. Hotdogs. Bratwurst.  Salted nuts and seeds. Canned beans with added salt. Canned or smoked fish. Whole eggs or egg yolks. Chicken or [place] with skin. Dairy Whole or 2% milk, cream, and half-and-half. Whole or full-fat cream cheese. Whole-fat or sweetened yogurt. Full-fat cheese. Nondairy creamers. Whipped toppings. Processed cheese and cheese spreads. Fats and oils Butter. Stick margarine. Lard. Shortening. Ghee. Bacon fat. Tropical oils, such as coconut, palm kernel, or palm oil. Seasoning and other foods Salted popcorn and pretzels. Onion salt, garlic salt, seasoned salt, table salt, and sea salt. Worcestershire sauce. Tartar sauce. Barbecue sauce. Teriyaki sauce. Soy sauce, including reduced-sodium. Steak sauce. Canned and packaged gravies. Fish sauce. Oyster sauce. Cocktail sauce. Horseradish that you find on the shelf. Ketchup. Mustard. Meat flavorings and tenderizers. Bouillon cubes. Hot sauce and Tabasco sauce. Premade or packaged marinades. Premade or packaged taco seasonings. Relishes. Regular salad dressings. Where to find more information:  National Heart, Lung, and [place]: https://wilson-eaton.com/  American Heart Association: www.heart.org Summary  The DASH eating plan is a healthy eating plan that has been shown to reduce high blood pressure (hypertension). It may also reduce your risk for type 2 diabetes, heart disease, and stroke.  With the DASH eating plan, you should limit salt (sodium) intake to 2,300 mg a day. If you have hypertension, you  may need to reduce your sodium intake to 1,500 mg a day.  When on the DASH eating plan, aim to eat more fresh fruits and vegetables, whole grains, lean proteins, low-fat dairy, and heart-healthy fats.  Work with your health care provider or diet and nutrition specialist (dietitian) to adjust your eating plan to your individual calorie needs. This information is not intended to replace advice given to you by your health care provider. Make sure you discuss any  questions you have with your health care provider. Document Released: 05/11/2011 Document Revised: 05/04/2017 Document Reviewed: 05/15/2016 Elsevier Patient Education  2020 [employer].

## 2019-04-01 NOTE — Progress Notes (Signed)
   Virtual Visit via Video   I connected with patient on 04/01/19 at  3:30 PM EDT by a video enabled telemedicine application and verified that I am speaking with the correct person using two identifiers.  Location patient: Home Location provider: [facility] Persons participating in the virtual visit: Patient, Provider, [place] (Patina Moore)  I discussed the limitations of evaluation and management by telemedicine and the availability of in person appointments. The patient expressed understanding and agreed to proceed.  Subjective:   HPI:   Patient presents via Doxy.Me today to discuss elevated BP measurements. Patient endorses BP have been slightly raised for some time in 140s/90s. Recently aat visit with spine specialist, BP in excess of 170/100. Did improve to upper 0000000 systolic on recheck. Patient denies chest pain, palpitations, lightheadedness, dizziness, vision changes or frequent headaches. Is trying to stay active but very limited due to ongoing and deteriorating OA of spine (seeing Neurosurgery for this).   BP Readings from Last 3 Encounters:  11/27/18 140/84  05/20/18 136/88  12/18/17 134/90   ROS:   See pertinent positives and negatives per HPI.  Patient Active Problem List   Diagnosis Date Noted  . Generalized anxiety disorder 05/20/2018  . Fatigue 11/15/2016  . Visit for preventive health examination 07/26/2016  . Sexual dysfunction 01/08/2014  . Low back pain with radiation 12/18/2013  . Asthma, moderate 02/07/2013  . IBS 05/04/2009  . Insomnia 02/05/2008    Social History   Tobacco Use  . Smoking status: Never Smoker  . Smokeless tobacco: Never Used  Substance Use Topics  . Alcohol use: No    Alcohol/week: 0.0 standard drinks    Current Outpatient Medications:  .  albuterol (PROVENTIL HFA;VENTOLIN HFA) 108 (90 Base) MCG/ACT inhaler, TAKE 2 PUFFS BY MOUTH EVERY 6 HOURS AS NEEDED FOR WHEEZE OR SHORTNESS OF BREATH, Disp: 18 Inhaler, Rfl: 3  .  cyclobenzaprine (FLEXERIL) 10 MG tablet, Take 1 tablet (10 mg total) by mouth 3 (three) times daily as needed for muscle spasms., Disp: 30 tablet, Rfl: 0 .  eszopiclone (LUNESTA) 1 MG TABS tablet, TAKE 1 TABLET BY MOUTH AT BEDTIME AS NEEDED FOR SLEEP. TAKE IMMEDIATELY BEFORE BEDTIME, Disp: 30 tablet, Rfl: 0 .  Melatonin 5 MG TABS, Take by mouth., Disp: , Rfl:  .  pantoprazole (PROTONIX) 40 MG tablet, TAKE 1 TABLET BY MOUTH EVERY DAY, Disp: 90 tablet, Rfl: 1 .  sulindac (CLINORIL) 200 MG tablet, Take 1 tablet by mouth 2 (two) times daily., Disp: , Rfl:  .  temazepam (RESTORIL) 15 MG capsule, TAKE 1 CAPSULE (15 MG TOTAL) BY MOUTH AT BEDTIME AS NEEDED FOR SLEEP., Disp: 15 capsule, Rfl: 3  Allergies  Allergen Reactions  . Hydrocodone Itching and Other (See Comments)    Hypersensitivity with whole pill    Objective:   There were no vitals taken for this visit.  Patient is well-developed, well-nourished in no acute distress.  Resting comfortably at home.  Head is normocephalic, atraumatic.  No labored breathing.  Speech is clear and coherent with logical content.  Patient is alert and oriented at baseline.   Assessment and Plan:   1. Hypertension, unspecified type DASH diet reviewed. Handout given. Feel NSAID use and pain level are big contributors. He is working on decreasing NSAID use as specialist is starting injections. Due to persistent elevation with significant spikes, will start Losartan 25 mg QD. In-office follow-up scheduled.     Leeanne Rio, PA-C 04/01/2019

## 2019-04-01 NOTE — Progress Notes (Signed)
I have discussed the procedure for the virtual visit with the patient who has given consent to proceed with assessment and treatment.   Kyle Miller, CMA     

## 2019-04-17 ENCOUNTER — Encounter: Payer: Self-pay | Admitting: Physician Assistant

## 2019-04-17 DIAGNOSIS — M545 Low back pain, unspecified: Secondary | ICD-10-CM

## 2019-04-21 ENCOUNTER — Encounter: Payer: BC Managed Care – PPO | Admitting: Physician Assistant

## 2019-04-23 ENCOUNTER — Other Ambulatory Visit: Payer: Self-pay

## 2019-04-23 ENCOUNTER — Other Ambulatory Visit: Payer: Self-pay | Admitting: Physician Assistant

## 2019-04-23 ENCOUNTER — Ambulatory Visit (HOSPITAL_BASED_OUTPATIENT_CLINIC_OR_DEPARTMENT_OTHER)
Admission: RE | Admit: 2019-04-23 | Discharge: 2019-04-23 | Disposition: A | Discharge status: Home / Self Care | Payer: BC Managed Care – PPO | Source: Ambulatory Visit | Attending: Physician Assistant | Admitting: Physician Assistant

## 2019-04-23 DIAGNOSIS — M545 Low back pain, unspecified: Secondary | ICD-10-CM

## 2019-04-24 ENCOUNTER — Encounter: Payer: Self-pay | Admitting: Physician Assistant

## 2019-05-21 ENCOUNTER — Other Ambulatory Visit: Payer: Self-pay | Admitting: Physician Assistant

## 2019-06-06 ENCOUNTER — Other Ambulatory Visit: Payer: Self-pay | Admitting: Physician Assistant

## 2019-06-06 DIAGNOSIS — G4700 Insomnia, unspecified: Secondary | ICD-10-CM

## 2019-06-17 IMAGING — DX DG CERVICAL SPINE COMPLETE 4+V
6 series · 6 of 6 positions shown · non-contrast
Comparison: None.

CLINICAL DATA: 41-year-old male with history of pain in the
posterior aspect of the cervical spine with radiation into the
posterior aspects of the shoulders bilaterally for 1 year.

EXAM:
CERVICAL SPINE - COMPLETE 4+ VIEW

[cervical spine lat (1 of 2)]
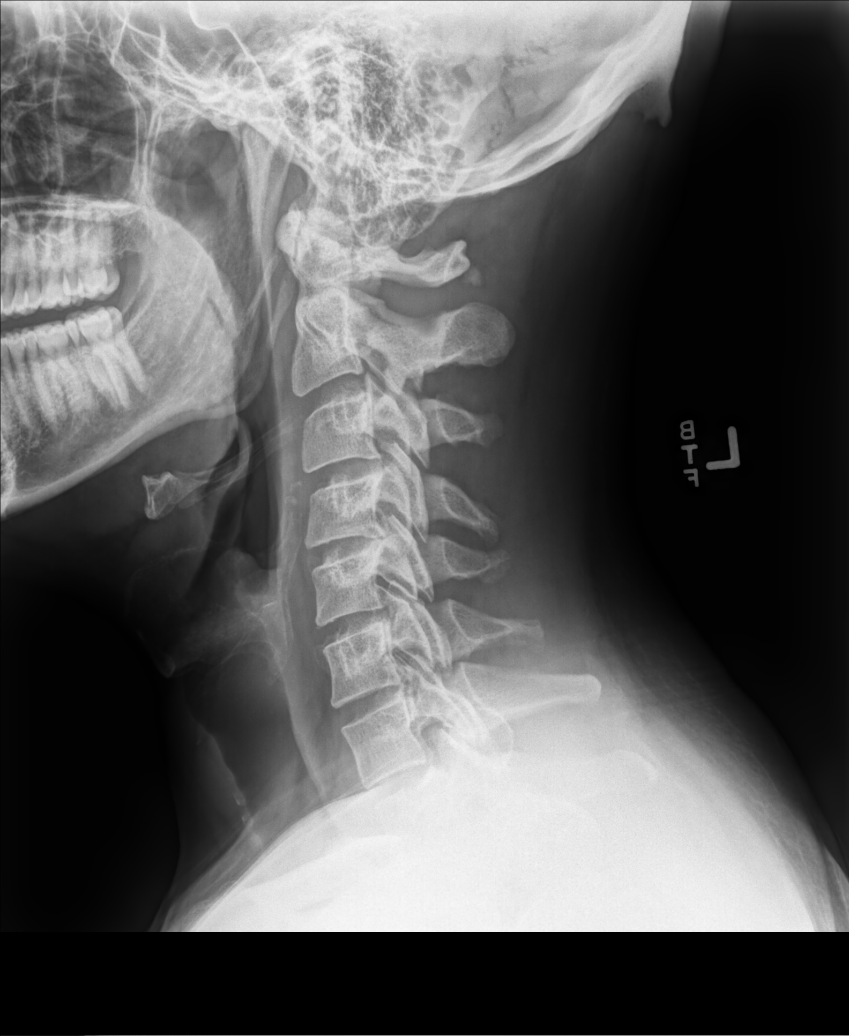

[cervical spine oblique (1 of 2)]
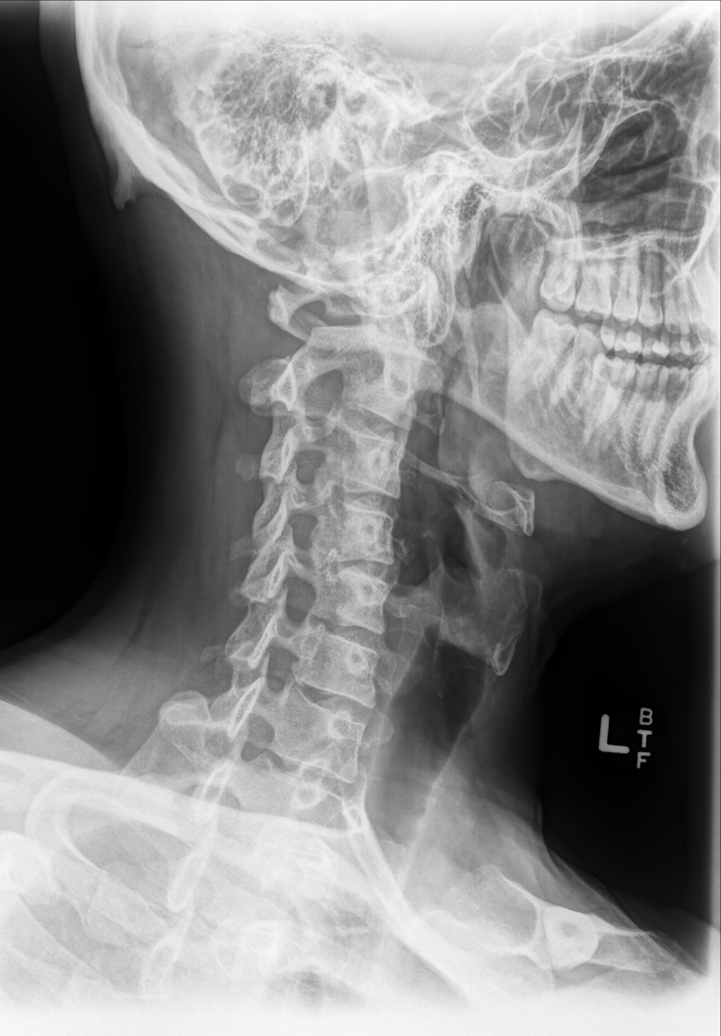

[cervical spine oblique (2 of 2)]
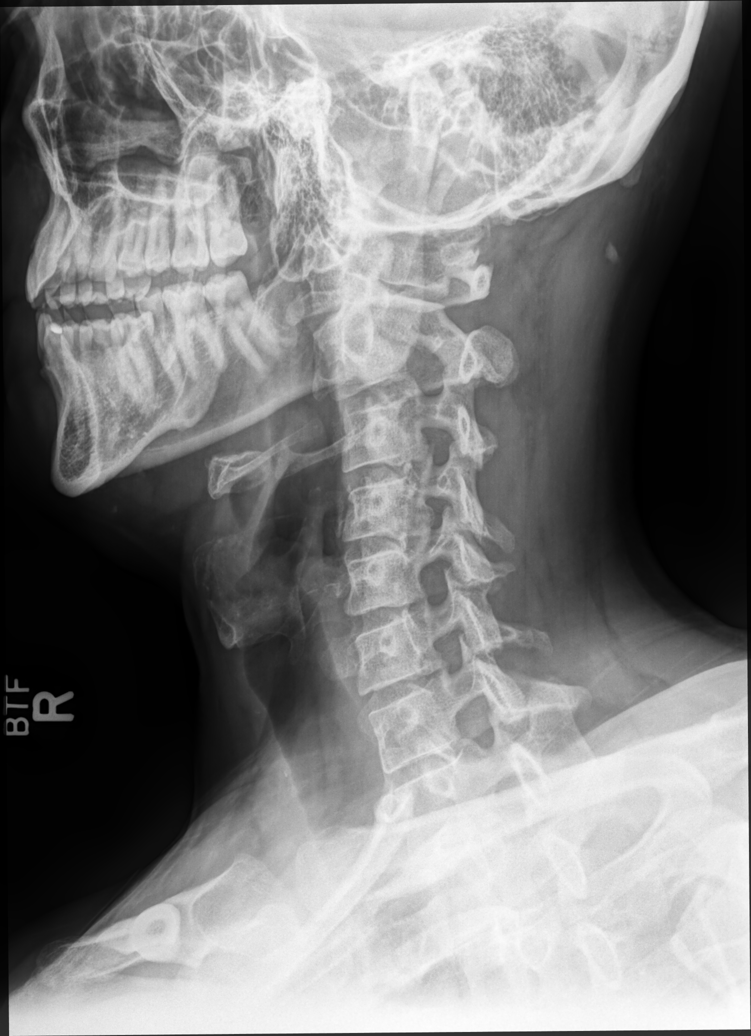

[cervical spine ap (1 of 2)]
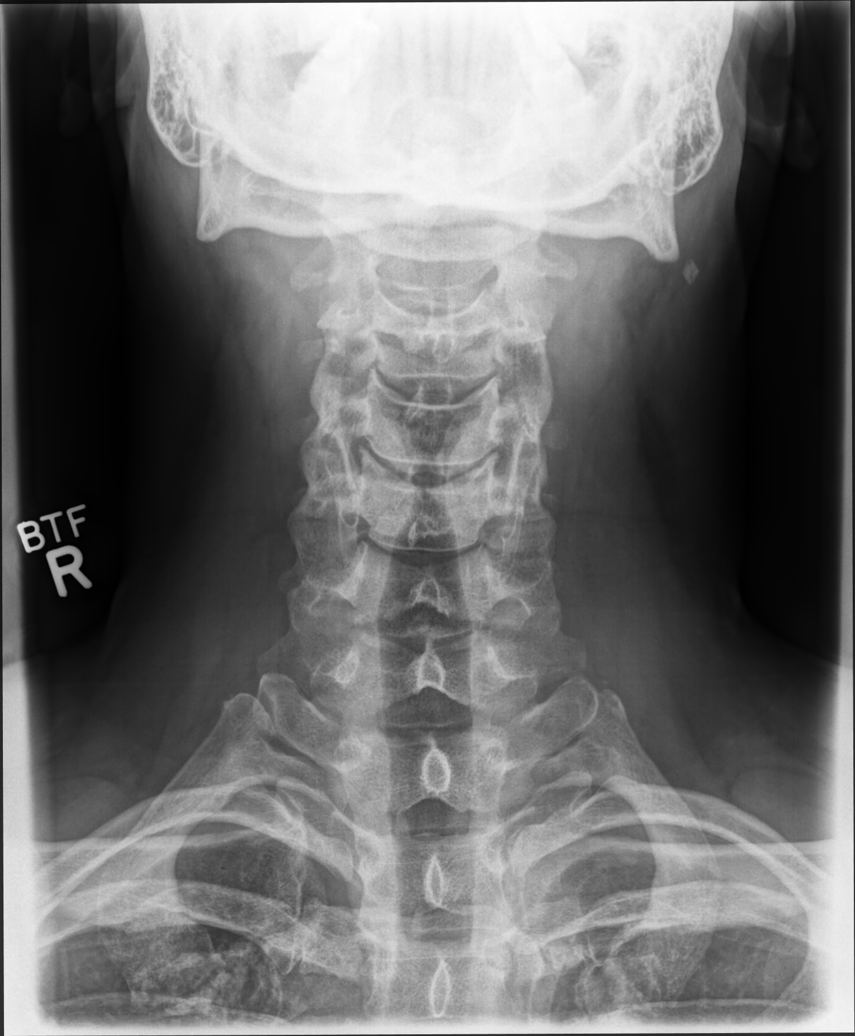

[cervical spine ap (2 of 2)]
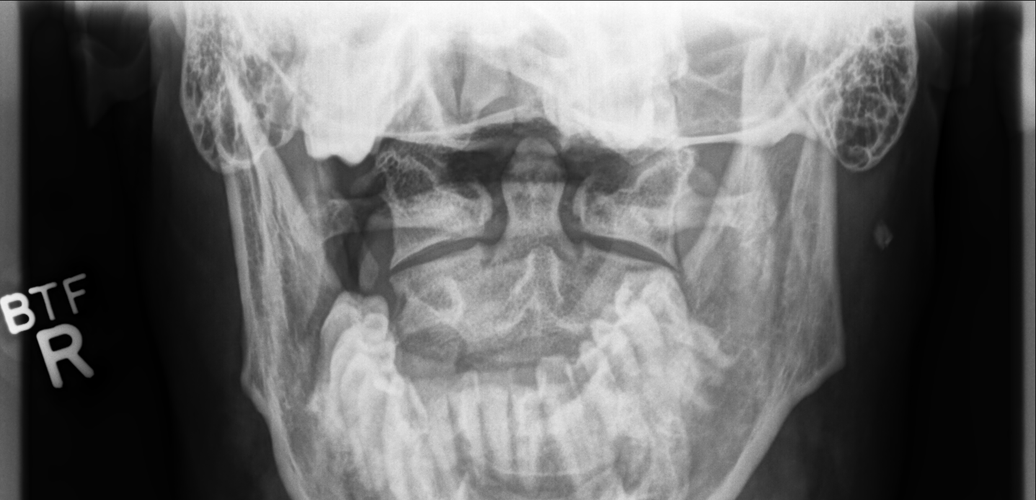

[cervical spine lat (2 of 2)]
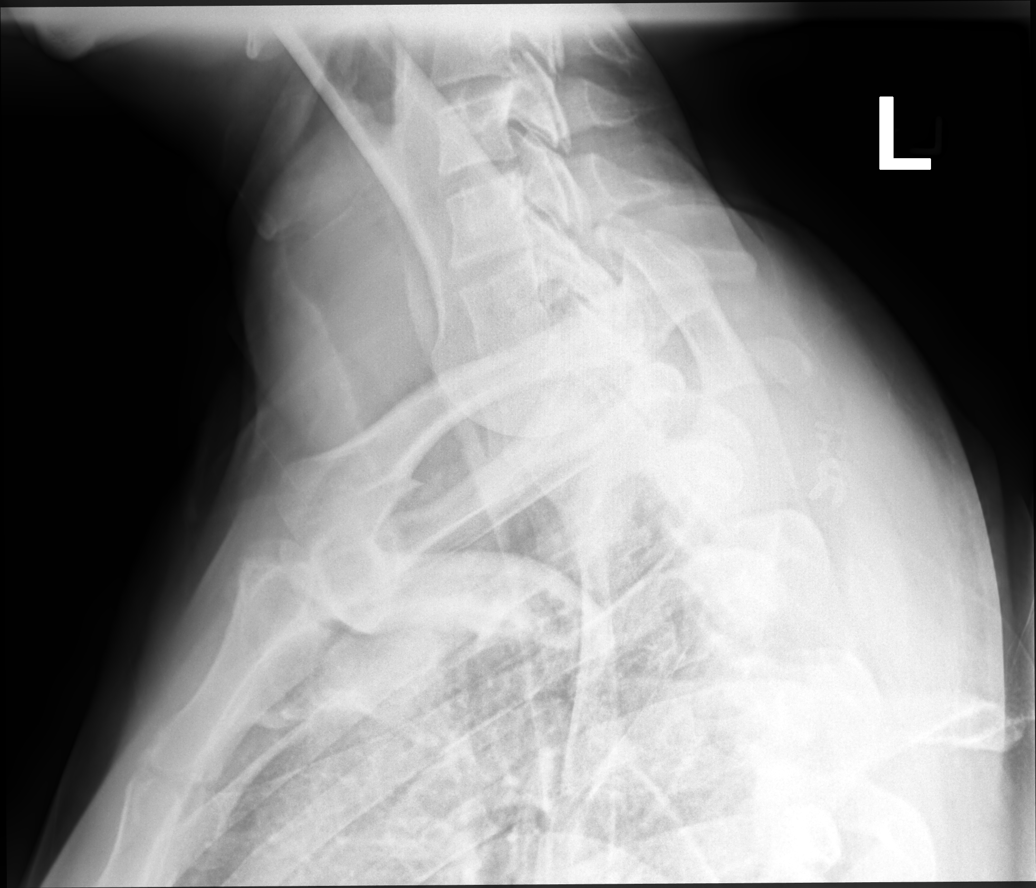

[6 of 6 positions shown; findings below may reference images not displayed]

FINDINGS: There is no evidence of cervical spine fracture or prevertebral soft
tissue swelling. Alignment is normal. Mild degenerative disc disease
and facet arthropathy most evident on the left side where there is
moderate narrowing of the left C4-C5 neural foramen.
IMPRESSION: 1. Mild degenerative disc disease and facet arthropathy, most severe
on the left side affecting the left C4-C5 neural foramen where there
is moderate stenosis.

## 2019-06-27 ENCOUNTER — Encounter: Payer: Self-pay | Admitting: Physician Assistant

## 2019-06-27 DIAGNOSIS — N281 Cyst of kidney, acquired: Secondary | ICD-10-CM

## 2019-07-04 ENCOUNTER — Other Ambulatory Visit: Payer: Self-pay | Admitting: Physician Assistant

## 2019-07-04 DIAGNOSIS — N281 Cyst of kidney, acquired: Secondary | ICD-10-CM

## 2019-07-09 ENCOUNTER — Other Ambulatory Visit: Payer: Self-pay | Admitting: Physician Assistant

## 2019-07-14 ENCOUNTER — Ambulatory Visit
Admission: RE | Admit: 2019-07-14 | Discharge: 2019-07-14 | Disposition: A | Discharge status: Home / Self Care | Payer: BC Managed Care – PPO | Source: Ambulatory Visit | Attending: Physician Assistant | Admitting: Physician Assistant

## 2019-07-14 DIAGNOSIS — N281 Cyst of kidney, acquired: Secondary | ICD-10-CM

## 2019-07-15 ENCOUNTER — Encounter: Payer: Self-pay | Admitting: Physician Assistant

## 2019-08-14 ENCOUNTER — Other Ambulatory Visit: Payer: Self-pay | Admitting: Physician Assistant

## 2019-08-14 DIAGNOSIS — G4700 Insomnia, unspecified: Secondary | ICD-10-CM

## 2019-08-14 NOTE — Telephone Encounter (Signed)
Tempazepam last rx 06/09/19 #15 3 RF LOV: 04/01/19  HTN

## 2019-08-29 ENCOUNTER — Other Ambulatory Visit: Payer: Self-pay | Admitting: Physician Assistant

## 2019-08-29 NOTE — Telephone Encounter (Signed)
Spoke with patient of refill request from pharmacy. He is still taking medication as directed. Will refill medication.

## 2019-09-22 ENCOUNTER — Encounter: Payer: Self-pay | Admitting: Physician Assistant

## 2019-09-23 ENCOUNTER — Encounter: Payer: Self-pay | Admitting: Physician Assistant

## 2019-09-23 ENCOUNTER — Other Ambulatory Visit: Payer: Self-pay

## 2019-09-23 ENCOUNTER — Ambulatory Visit: Payer: BC Managed Care – PPO | Admitting: Physician Assistant

## 2019-09-23 VITALS — BP 138/82 | HR 82 | Temp 98.7°F | Resp 16 | Ht 73.0 in | Wt 206.0 lb

## 2019-09-23 DIAGNOSIS — M79672 Pain in left foot: Secondary | ICD-10-CM | POA: Diagnosis not present

## 2019-09-23 DIAGNOSIS — R109 Unspecified abdominal pain: Secondary | ICD-10-CM

## 2019-09-23 DIAGNOSIS — M545 Low back pain, unspecified: Secondary | ICD-10-CM

## 2019-09-23 LAB — POCT URINALYSIS DIPSTICK
Bilirubin, UA: NEGATIVE
Blood, UA: NEGATIVE
Glucose, UA: NEGATIVE
Ketones, UA: NEGATIVE
Leukocytes, UA: NEGATIVE
Nitrite, UA: NEGATIVE
Protein, UA: NEGATIVE
Spec Grav, UA: 1.015 (ref 1.010–1.025)
Urobilinogen, UA: 0.2 E.U./dL
pH, UA: 6 (ref 5.0–8.0)

## 2019-09-23 NOTE — Patient Instructions (Addendum)
Please keep well-hydrated and get plenty of rest.  Avoid bladder irritants like caffeine.  Urine testing today is normal.  The symptoms today seem consistent with low back strain and muscular tension/spasm. The lumbar perispinal muscles are very tensed. I am starting you on a muscle relaxant to use nightly over the rest of the week. Restart your Clinoril as this will help with lower back and your foot.  For the foot, wear supportive (lave up) footwear this next week. Elevate legs while resting. Wear the Acew wrap as directed. Let me know if not calming down over the next 10 days or so.

## 2019-09-23 NOTE — Progress Notes (Signed)
Patient presents to clinic today to discuss multiple complaints.  Patient had a 1 episode of pain in bilateral lower back with urination this past weekend.  Noted he had been with his friends at the beach.  There have been a lot of walking as they were on a fishing trip.  Does note some heavy lifting but no more so than usual.  Did note significant increase in alcohol consumption, and as such a significant increase in urination.  Denies any recurrence of painful urination.  Denies urinary urgency, frequency, hematuria, nausea or vomiting.  Denies fever or chills.  Denies flank pain.  Notes tension in his lower back bilaterally.  Denies radiation of symptoms into lower extremities.  Patient also endorses 3 days of pain and swelling of L foot. Denies trauma or injury. Did recently do a lot of walking at the beach. Notes some lateral pain and swelling. Had a fracture to the midfoot many years ago.  Has not taken anything for symptoms.  Past Medical History:  Diagnosis Date  . Asthma   . Bulging lumbar disc   . Depression   . GERD (gastroesophageal reflux disease)   . History of chicken pox     Current Outpatient Medications on File Prior to Visit  Medication Sig Dispense Refill  . albuterol (PROVENTIL HFA;VENTOLIN HFA) 108 (90 Base) MCG/ACT inhaler TAKE 2 PUFFS BY MOUTH EVERY 6 HOURS AS NEEDED FOR WHEEZE OR SHORTNESS OF BREATH 18 Inhaler 3  . escitalopram (LEXAPRO) 10 MG tablet TAKE 1 TABLET BY MOUTH EVERY DAY (Patient taking differently: Take 5 mg by mouth daily. ) 90 tablet 1  . pantoprazole (PROTONIX) 40 MG tablet TAKE 1 TABLET BY MOUTH EVERY DAY 90 tablet 1  . temazepam (RESTORIL) 15 MG capsule TAKE 1 CAPSULE (15 MG TOTAL) BY MOUTH AT BEDTIME AS NEEDED FOR SLEEP. 15 capsule 3  . Melatonin 5 MG TABS Take by mouth.    . sulindac (CLINORIL) 200 MG tablet Take 1 tablet by mouth 2 (two) times daily.     No current facility-administered medications on file prior to visit.    Allergies    Allergen Reactions  . Hydrocodone Itching and Other (See Comments)    Hypersensitivity with whole pill    Family History  Problem Relation Age of Onset  . Healthy Mother        Living  . Healthy Father        Living  . Thyroid disease Brother   . Asthma Daughter     Social History   Socioeconomic History  . Marital status: Married    Spouse name: Not on file  . Number of children: Not on file  . Years of education: Not on file  . Highest education level: Not on file  Occupational History  . Not on file  Tobacco Use  . Smoking status: Never Smoker  . Smokeless tobacco: Never Used  Substance and Sexual Activity  . Alcohol use: No    Alcohol/week: 0.0 standard drinks  . Drug use: No  . Sexual activity: Yes    Partners: Female  Other Topics Concern  . Not on file  Social History Narrative   He live with wife and youngest daughter.   He works as a Civil Service fast streamer.   Highest level of education:  Secretary/administrator, BS   Social Determinants of Health   Financial Resource Strain:   . Difficulty of Paying Living Expenses:   Food Insecurity:   . Worried About [employer] of  Food in the Last Year:   . [place] in the Last Year:   Transportation Needs:   . Film/video editor (Medical):   Marland Kitchen Lack of Transportation (Non-Medical):   Physical Activity:   . Days of Exercise per Week:   . Minutes of Exercise per Session:   Stress:   . Feeling of Stress :   Social Connections:   . Frequency of Communication with Friends and Family:   . Frequency of Social Gatherings with Friends and Family:   . Attends Religious Services:   . Active Member of Clubs or Organizations:   . Attends Archivist Meetings:   Marland Kitchen Marital Status:    Review of Systems - See HPI.  All other ROS are negative.  BP 138/82   Pulse 82   Temp 98.7 F (37.1 C) (Temporal)   Resp 16   Ht 6\' 1"  (1.854 m)   Wt 206 lb (93.4 kg)   SpO2 98%   BMI 27.18 kg/m   Physical Exam Vitals reviewed.   Constitutional:      Appearance: Normal appearance.  HENT:     Head: Normocephalic and atraumatic.  Cardiovascular:     Pulses:          Dorsalis pedis pulses are 2+ on the left side.       Posterior tibial pulses are 2+ on the left side.  Pulmonary:     Effort: Pulmonary effort is normal.  Abdominal:     General: Bowel sounds are normal.     Palpations: Abdomen is soft.     Tenderness: There is no abdominal tenderness. There is no right CVA tenderness or left CVA tenderness.  Musculoskeletal:     Cervical back: Neck supple.     Thoracic back: Normal.     Lumbar back: Spasms and tenderness present. No bony tenderness. Normal range of motion.     Left ankle: Tenderness present over the ATF ligament. No lateral malleolus tenderness. Normal range of motion.     Left foot: Normal range of motion. Tenderness (lateral foot extending from underneath ankle to mid forefoot.) present.  Feet:     Left foot:     Skin integrity: Skin integrity normal.  Neurological:     Mental Status: He is alert.  Psychiatric:        Mood and Affect: Mood normal.    Assessment/Plan: 1. Acute bilateral low back pain without sciatica Urine dip unremarkable today.  Bilateral lumbar paraspinal muscular tension and spasm noted on examination.  Will start cyclobenzaprine nightly over the next few nights.  Patient to restart his sulindac as directed.  Follow-up if not improving. - POCT Urinalysis Dipstick  2. Left foot pain No history of strain or sprain but there is tenderness to his anterior talofibular ligament.  RICE recommended.  Start Ace wrap.  Sulindac as noted above.  Patient instructed to wear supportive (lace up) footwear.  Will monitor over the next couple weeks.  If not improving may need imaging versus referral to podiatry.  This visit occurred during the SARS-CoV-2 public health emergency.  Safety protocols were in place, including screening questions prior to the visit, additional usage of staff  PPE, and extensive cleaning of exam room while observing appropriate contact time as indicated for disinfecting solutions.     Leeanne Rio, PA-C

## 2019-10-06 ENCOUNTER — Telehealth: Payer: Self-pay | Admitting: Physician Assistant

## 2019-10-06 NOTE — Telephone Encounter (Signed)
LM making pt aware to call the office and make an appt. Also sent a mychart message.

## 2019-10-06 NOTE — Telephone Encounter (Signed)
Received paperwork from [facility] regarding patient's upcoming lumbar discectomy on 10/28/2019. They are requesting a formal surgical clearance from PCP including multiple labs, examination, documentation and EKG. Please call patient to get this scheduled at his earliest convenience.

## 2019-10-14 ENCOUNTER — Other Ambulatory Visit: Payer: Self-pay | Admitting: Physician Assistant

## 2019-10-14 DIAGNOSIS — G4700 Insomnia, unspecified: Secondary | ICD-10-CM

## 2019-10-15 NOTE — Telephone Encounter (Signed)
Temazepam 15mg  last filled 08/14/19 with 3 refills Last office visit 09/23/19 No future appointment scheduled.

## 2019-12-16 ENCOUNTER — Other Ambulatory Visit: Payer: Self-pay | Admitting: Physician Assistant

## 2019-12-16 ENCOUNTER — Other Ambulatory Visit: Payer: Self-pay | Admitting: Family Medicine

## 2019-12-16 DIAGNOSIS — G4700 Insomnia, unspecified: Secondary | ICD-10-CM

## 2019-12-17 NOTE — Telephone Encounter (Signed)
Pharmacy requesting Flexeril refill. Last rx 03/29/18 #30. Patient took when he was not taking the Sulindac. LOV: 09/23/19 acute low back pain

## 2020-02-16 ENCOUNTER — Other Ambulatory Visit: Payer: Self-pay | Admitting: Physician Assistant

## 2020-02-16 DIAGNOSIS — G4700 Insomnia, unspecified: Secondary | ICD-10-CM

## 2020-02-16 NOTE — Telephone Encounter (Signed)
Temazepam last rx 12/17/19 #30 1 RF LOV: 09/23/19 Back pain Patient is over due for CPE 11/2019

## 2020-02-17 ENCOUNTER — Encounter: Payer: Self-pay | Admitting: Physician Assistant

## 2020-03-12 ENCOUNTER — Other Ambulatory Visit: Payer: Self-pay

## 2020-03-12 ENCOUNTER — Other Ambulatory Visit: Payer: Self-pay | Admitting: Physician Assistant

## 2020-03-12 MED ORDER — ESCITALOPRAM OXALATE 5 MG PO TABS: 5.0000 mg | ORAL_TABLET | Freq: Every day | ORAL | 0 refills | Status: DC

## 2020-03-12 NOTE — Telephone Encounter (Signed)
Patient called back in reference to his Escitalopram. Patient states he is taking 5mg  daily and is not in need of any refills at this time.

## 2020-03-12 NOTE — Telephone Encounter (Signed)
Left a vm message for the patient to call the office back to let us know how he is taking the Escitalopram.

## 2020-03-24 ENCOUNTER — Other Ambulatory Visit: Payer: Self-pay | Admitting: Physician Assistant

## 2020-04-17 ENCOUNTER — Other Ambulatory Visit: Payer: Self-pay | Admitting: Physician Assistant

## 2020-04-17 DIAGNOSIS — G4700 Insomnia, unspecified: Secondary | ICD-10-CM

## 2020-04-19 ENCOUNTER — Encounter: Payer: Self-pay | Admitting: Physician Assistant

## 2020-04-19 ENCOUNTER — Other Ambulatory Visit: Payer: Self-pay

## 2020-04-19 DIAGNOSIS — G4700 Insomnia, unspecified: Secondary | ICD-10-CM

## 2020-04-19 MED ORDER — TEMAZEPAM 15 MG PO CAPS: ORAL_CAPSULE | ORAL | 1 refills | Status: DC

## 2020-04-19 NOTE — Telephone Encounter (Signed)
Temazepam last rx 02/16/20 #30 1 RF LOV: 09/23/19 back pain Next appt: 04/26/20

## 2020-04-19 NOTE — Telephone Encounter (Signed)
Hello, The temazepam sleep medication I am currently out of, normally I would just let [facility] get in touch with you however I am leaving town first thing in the morning for the rest of the week. If there's anyway you could call or send message to [facility] to approve it to get filled would be great,  I've already made the request last night through the [facility] automated service.  Thank you so much and I will see you Monday  LFD 02/16/20 #30 with 1 refill LOV 09/23/19 NOV 04/26/20 for cpe

## 2020-04-26 ENCOUNTER — Other Ambulatory Visit: Payer: Self-pay

## 2020-04-26 ENCOUNTER — Ambulatory Visit (INDEPENDENT_AMBULATORY_CARE_PROVIDER_SITE_OTHER): Payer: BC Managed Care – PPO | Admitting: Physician Assistant

## 2020-04-26 ENCOUNTER — Encounter: Payer: Self-pay | Admitting: Physician Assistant

## 2020-04-26 VITALS — BP 120/94 | HR 89 | Temp 98.3°F | Resp 16 | Ht 73.0 in | Wt 218.0 lb

## 2020-04-26 DIAGNOSIS — Z1211 Encounter for screening for malignant neoplasm of colon: Secondary | ICD-10-CM

## 2020-04-26 DIAGNOSIS — R5383 Other fatigue: Secondary | ICD-10-CM | POA: Diagnosis not present

## 2020-04-26 DIAGNOSIS — Z0001 Encounter for general adult medical examination with abnormal findings: Secondary | ICD-10-CM | POA: Diagnosis not present

## 2020-04-26 DIAGNOSIS — G4700 Insomnia, unspecified: Secondary | ICD-10-CM

## 2020-04-26 DIAGNOSIS — I10 Essential (primary) hypertension: Secondary | ICD-10-CM | POA: Diagnosis not present

## 2020-04-26 DIAGNOSIS — Z0000 Encounter for general adult medical examination without abnormal findings: Secondary | ICD-10-CM

## 2020-04-26 LAB — LIPID PANEL
Cholesterol: 221 mg/dL — ABNORMAL HIGH (ref 0–200)
HDL: 43.4 mg/dL (ref >39.00)
LDL Cholesterol: 156 mg/dL — ABNORMAL HIGH (ref 0–99)
NonHDL: 177.89
Total CHOL/HDL Ratio: 5
Triglycerides: 107 mg/dL (ref 0.0–149.0)
VLDL: 21.4 mg/dL (ref 0.0–40.0)

## 2020-04-26 LAB — CBC WITH DIFFERENTIAL/PLATELET
Basophils Absolute: 0 10*3/uL (ref 0.0–0.1)
Basophils Relative: 0.7 % (ref 0.0–3.0)
Eosinophils Absolute: 0.1 10*3/uL (ref 0.0–0.7)
Eosinophils Relative: 1.5 % (ref 0.0–5.0)
HCT: 42.8 % (ref 39.0–52.0)
Hemoglobin: 14.4 g/dL (ref 13.0–17.0)
Lymphocytes Relative: 31 % (ref 12.0–46.0)
Lymphs Abs: 2.1 10*3/uL (ref 0.7–4.0)
MCHC: 33.6 g/dL (ref 30.0–36.0)
MCV: 91.9 fl (ref 78.0–100.0)
Monocytes Absolute: 0.7 10*3/uL (ref 0.1–1.0)
Monocytes Relative: 10.7 % (ref 3.0–12.0)
Neutro Abs: 3.8 10*3/uL (ref 1.4–7.7)
Neutrophils Relative %: 56.1 % (ref 43.0–77.0)
Platelets: 273 10*3/uL (ref 150.0–400.0)
RBC: 4.66 Mil/uL (ref 4.22–5.81)
RDW: 13.1 % (ref 11.5–15.5)
WBC: 6.7 10*3/uL (ref 4.0–10.5)

## 2020-04-26 LAB — COMPREHENSIVE METABOLIC PANEL
ALT: 21 U/L (ref 0–53)
AST: 14 U/L (ref 0–37)
Albumin: 4.2 g/dL (ref 3.5–5.2)
Alkaline Phosphatase: 58 U/L (ref 39–117)
BUN: 10 mg/dL (ref 6–23)
CO2: 28 mEq/L (ref 19–32)
Calcium: 9.3 mg/dL (ref 8.4–10.5)
Chloride: 103 mEq/L (ref 96–112)
Creatinine, Ser: 0.85 mg/dL (ref 0.40–1.50)
GFR: 104.65 mL/min (ref >60.00)
Glucose, Bld: 98 mg/dL (ref 70–99)
Potassium: 4.1 mEq/L (ref 3.5–5.1)
Sodium: 138 mEq/L (ref 135–145)
Total Bilirubin: 0.7 mg/dL (ref 0.2–1.2)
Total Protein: 6.5 g/dL (ref 6.0–8.3)

## 2020-04-26 LAB — HEMOGLOBIN A1C: Hgb A1c MFr Bld: 5.5 % (ref 4.6–6.5)

## 2020-04-26 LAB — VITAMIN D 25 HYDROXY (VIT D DEFICIENCY, FRACTURES): VITD: 19.95 ng/mL — ABNORMAL LOW (ref 30.00–100.00)

## 2020-04-26 LAB — VITAMIN B12: Vitamin B-12: 801 pg/mL (ref 211–911)

## 2020-04-26 LAB — TSH: TSH: 0.36 u[IU]/mL (ref 0.35–4.50)

## 2020-04-26 NOTE — Patient Instructions (Signed)
Please go to the lab for blood work.   Our office will call you with your results unless you have chosen to receive results via MyChart.  If your blood work is normal we will follow-up each year for physicals and as scheduled for chronic medical problems.  If anything is abnormal we will treat accordingly and get you in for a follow-up.  You will be contacted by a nutritionist.  If you do not hear from them within 2 weeks, please let me know.   Please read over the handouts given to decide about colon cancer screening. Let me know if you have any further questions after our discussion and the handouts given today.    Preventive Care 73-86 Years Old, Male Preventive care refers to lifestyle choices and visits with your health care provider that can promote health and wellness. This includes:  A yearly physical exam. This is also called an annual well check.  Regular dental and eye exams.  Immunizations.  Screening for certain conditions.  Healthy lifestyle choices, such as eating a healthy diet, getting regular exercise, not using drugs or products that contain nicotine and tobacco, and limiting alcohol use. What can I expect for my preventive care visit? Physical exam Your health care provider will check:  Height and weight. These may be used to calculate body mass index (BMI), which is a measurement that tells if you are at a healthy weight.  Heart rate and blood pressure.  Your skin for abnormal spots. Counseling Your health care provider may ask you questions about:  Alcohol, tobacco, and drug use.  Emotional well-being.  Home and relationship well-being.  Sexual activity.  Eating habits.  Work and work Statistician. What immunizations do I need?  Influenza (flu) vaccine  This is recommended every year. Tetanus, diphtheria, and pertussis (Tdap) vaccine  You may need a Td booster every 10 years. Varicella (chickenpox) vaccine  You may need this vaccine if  you have not already been vaccinated. Zoster (shingles) vaccine  You may need this after age 45. Measles, mumps, and rubella (MMR) vaccine  You may need at least one dose of MMR if you were born in 1957 or later. You may also need a second dose. Pneumococcal conjugate (PCV13) vaccine  You may need this if you have certain conditions and were not previously vaccinated. Pneumococcal polysaccharide (PPSV23) vaccine  You may need one or two doses if you smoke cigarettes or if you have certain conditions. Meningococcal conjugate (MenACWY) vaccine  You may need this if you have certain conditions. Hepatitis A vaccine  You may need this if you have certain conditions or if you travel or work in places where you may be exposed to hepatitis A. Hepatitis B vaccine  You may need this if you have certain conditions or if you travel or work in places where you may be exposed to hepatitis B. Haemophilus influenzae type b (Hib) vaccine  You may need this if you have certain risk factors. Human papillomavirus (HPV) vaccine  If recommended by your health care provider, you may need three doses over 6 months. You may receive vaccines as individual doses or as more than one vaccine together in one shot (combination vaccines). Talk with your health care provider about the risks and benefits of combination vaccines. What tests do I need? Blood tests  Lipid and cholesterol levels. These may be checked every 5 years, or more frequently if you are over 35 years old.  Hepatitis C test.  Hepatitis  B test. Screening  Lung cancer screening. You may have this screening every year starting at age 16 if you have a 30-pack-year history of smoking and currently smoke or have quit within the past 15 years.  Prostate cancer screening. Recommendations will vary depending on your family history and other risks.  Colorectal cancer screening. All adults should have this screening starting at age 60 and  continuing until age 33. Your health care provider may recommend screening at age 45 if you are at increased risk. You will have tests every 1-10 years, depending on your results and the type of screening test.  Diabetes screening. This is done by checking your blood sugar (glucose) after you have not eaten for a while (fasting). You may have this done every 1-3 years.  Sexually transmitted disease (STD) testing. Follow these instructions at home: Eating and drinking  Eat a diet that includes fresh fruits and vegetables, whole grains, lean protein, and low-fat dairy products.  Take vitamin and mineral supplements as recommended by your health care provider.  Do not drink alcohol if your health care provider tells you not to drink.  If you drink alcohol: ? Limit how much you have to 0-2 drinks a day. ? Be aware of how much alcohol is in your drink. In the [place] one drink equals one 12 oz bottle of beer (355 mL), one 5 oz glass of wine (148 mL), or one 1 oz glass of hard liquor (44 mL). Lifestyle  Take daily care of your teeth and gums.  Stay active. Exercise for at least 30 minutes on 5 or more days each week.  Do not use any products that contain nicotine or tobacco, such as cigarettes, e-cigarettes, and chewing tobacco. If you need help quitting, ask your health care provider.  If you are sexually active, practice safe sex. Use a condom or other form of protection to prevent STIs (sexually transmitted infections).  Talk with your health care provider about taking a low-dose aspirin every day starting at age 64. What's next?  Go to your health care provider once a year for a well check visit.  Ask your health care provider how often you should have your eyes and teeth checked.  Stay up to date on all vaccines. This information is not intended to replace advice given to you by your health care provider. Make sure you discuss any questions you have with your health care  provider. Document Revised: 05/16/2018 Document Reviewed: 05/16/2018 Elsevier Patient Education  2020 [employer].

## 2020-04-26 NOTE — Progress Notes (Signed)
Patient presents to clinic today for annual exam.  Patient is fasting for labs.  Patient is walking daily at least 2 miles per day. Notes diet is not the greatest at present -- wife is on keto diet so he is eating a lot more red meat. States he knows what he should be doing but just isn't. Is interested in a nutritionist referral.  Notes his mood has been doing very well overall.  He is taking his Lexapro daily as directed.  Is sleeping well with use of the temazepam.  Continues to follow with a specialist for chronic low back pain.  Health Maintenance: Immunizations -- Declines flu shot. TDaP and COVID vaccines are up-to-date. Colon Cancer Screening --average risk.  Due.  HIV/Hep C Screening --declines  Past Medical History:  Diagnosis Date  . Asthma   . Bulging lumbar disc   . Depression   . GERD (gastroesophageal reflux disease)   . History of chicken pox     Past Surgical History:  Procedure Laterality Date  . WISDOM TOOTH EXTRACTION      Current Outpatient Medications on File Prior to Visit  Medication Sig Dispense Refill  . albuterol (VENTOLIN HFA) 108 (90 Base) MCG/ACT inhaler TAKE 2 PUFFS BY MOUTH EVERY 6 HOURS AS NEEDED FOR WHEEZE OR SHORTNESS OF BREATH 18 each 3  . cyclobenzaprine (FLEXERIL) 5 MG tablet TAKE 1 TABLET BY MOUTH 3 TIMES A DAY AS NEEDED FOR MUSCLE SPASM 30 tablet 1  . escitalopram (LEXAPRO) 5 MG tablet Take 1 tablet (5 mg total) by mouth daily. 30 tablet 0  . Melatonin 5 MG TABS Take by mouth.    . nabumetone (RELAFEN) 500 MG tablet Take 500 mg by mouth 2 (two) times daily.    . pantoprazole (PROTONIX) 40 MG tablet TAKE 1 TABLET BY MOUTH EVERY DAY 90 tablet 1  . sulindac (CLINORIL) 200 MG tablet Take 1 tablet by mouth 2 (two) times daily.    . temazepam (RESTORIL) 15 MG capsule TAKE 1 CAPSULE BY MOUTH AT BEDTIME AS NEEDED FOR SLEEP.*MAX 45/75 DAYS* 30 capsule 1   No current facility-administered medications on file prior to visit.    Allergies    Allergen Reactions  . Hydrocodone Itching and Other (See Comments)    Hypersensitivity with whole pill    Family History  Problem Relation Age of Onset  . Healthy Mother        Living  . Healthy Father        Living  . Thyroid disease Brother   . Asthma Daughter     Social History   Socioeconomic History  . Marital status: Married    Spouse name: Not on file  . Number of children: Not on file  . Years of education: Not on file  . Highest education level: Not on file  Occupational History  . Not on file  Tobacco Use  . Smoking status: Never Smoker  . Smokeless tobacco: Never Used  Vaping Use  . Vaping Use: Never used  Substance and Sexual Activity  . Alcohol use: No    Alcohol/week: 0.0 standard drinks  . Drug use: No  . Sexual activity: Yes    Partners: Female  Other Topics Concern  . Not on file  Social History Narrative   He live with wife and youngest daughter.   He works as a Civil Service fast streamer.   Highest level of education:  Secretary/administrator, BS   Social Determinants of Health   Financial Resource Strain:   .  Difficulty of Paying Living Expenses: Not on file  Food Insecurity:   . Worried About Charity fundraiser in the Last Year: Not on file  . Ran Out of Food in the Last Year: Not on file  Transportation Needs:   . Lack of Transportation (Medical): Not on file  . Lack of Transportation (Non-Medical): Not on file  Physical Activity:   . Days of Exercise per Week: Not on file  . Minutes of Exercise per Session: Not on file  Stress:   . Feeling of Stress : Not on file  Social Connections:   . Frequency of Communication with Friends and Family: Not on file  . Frequency of Social Gatherings with Friends and Family: Not on file  . Attends Religious Services: Not on file  . Active Member of Clubs or Organizations: Not on file  . Attends Archivist Meetings: Not on file  . Marital Status: Not on file  Intimate Partner Violence:   . Fear of Current or  Ex-Partner: Not on file  . Emotionally Abused: Not on file  . Physically Abused: Not on file  . Sexually Abused: Not on file   Review of Systems  Constitutional: Negative for fever and weight loss.  HENT: Negative for ear discharge, ear pain, hearing loss and tinnitus.   Eyes: Negative for blurred vision, double vision, photophobia and pain.  Respiratory: Negative for cough and shortness of breath.   Cardiovascular: Negative for chest pain and palpitations.  Gastrointestinal: Negative for abdominal pain, blood in stool, constipation, diarrhea, heartburn, melena, nausea and vomiting.  Genitourinary: Negative for dysuria, flank pain, frequency, hematuria and urgency.  Musculoskeletal: Negative for falls.  Neurological: Negative for dizziness, loss of consciousness and headaches.  Endo/Heme/Allergies: Negative for environmental allergies.  Psychiatric/Behavioral: Negative for depression, hallucinations, substance abuse and suicidal ideas. The patient is not nervous/anxious and does not have insomnia.     Wt 218 lb (98.9 kg)   BMI 28.76 kg/m   Physical Exam Vitals reviewed.  Constitutional:      General: He is not in acute distress.    Appearance: He is well-developed. He is not diaphoretic.  HENT:     Head: Normocephalic and atraumatic.     Right Ear: Tympanic membrane, ear canal and external ear normal.     Left Ear: Tympanic membrane, ear canal and external ear normal.     Nose: Nose normal.     Mouth/Throat:     Pharynx: No posterior oropharyngeal erythema.  Eyes:     Conjunctiva/sclera: Conjunctivae normal.     Pupils: Pupils are equal, round, and reactive to light.  Neck:     Thyroid: No thyromegaly.  Cardiovascular:     Rate and Rhythm: Normal rate and regular rhythm.     Heart sounds: Normal heart sounds.  Pulmonary:     Effort: Pulmonary effort is normal. No respiratory distress.     Breath sounds: Normal breath sounds. No wheezing or rales.  Chest:     Chest wall:  No tenderness.  Abdominal:     General: Bowel sounds are normal. There is no distension.     Palpations: Abdomen is soft. There is no mass.     Tenderness: There is no abdominal tenderness. There is no guarding or rebound.  Musculoskeletal:     Cervical back: Neck supple.  Lymphadenopathy:     Cervical: No cervical adenopathy.  Skin:    General: Skin is warm and dry.     Findings: No  rash.  Neurological:     Mental Status: He is alert and oriented to person, place, and time.     Cranial Nerves: No cranial nerve deficit.    Assessment/Plan: 1. Visit for preventive health examination Depression screen negative. Health Maintenance reviewed. Preventive schedule discussed and handout given in AVS. Will obtain fasting labs today.  - CBC with Differential/Platelet  2. Colon cancer screening Average risk.  Asymptomatic.  Discussed colonoscopy versus Cologuard, pros and cons.  Handouts given.  He is going to discuss with his wife and get back to [place].  3. Primary hypertension BP slightly above goal.  We will have him restart DASH diet.  Keep a check of blood pressure at home.  Will reassess in a few weeks.  If still elevated we will add on low-dose medication in addition to working on therapeutic lifestyle changes.  Repeat labs today as noted below. - Comprehensive metabolic panel - Lipid panel - Hemoglobin A1c  4. Insomnia, unspecified type Doing very well.  Continue current regimen.  5. Fatigue, unspecified type With history of vitamin D and B12 deficiency.  Repeat levels today along with TSH and CBC.  Will treat according to findings. - TSH - Vitamin D (25 hydroxy) - B12  This visit occurred during the SARS-CoV-2 public health emergency.  Safety protocols were in place, including screening questions prior to the visit, additional usage of staff PPE, and extensive cleaning of exam room while observing appropriate contact time as indicated for disinfecting solutions.    Leeanne Rio, PA-C

## 2020-04-27 ENCOUNTER — Other Ambulatory Visit: Payer: Self-pay

## 2020-04-27 ENCOUNTER — Encounter: Payer: Self-pay | Admitting: Physician Assistant

## 2020-04-27 DIAGNOSIS — E782 Mixed hyperlipidemia: Secondary | ICD-10-CM

## 2020-04-27 DIAGNOSIS — E1169 Type 2 diabetes mellitus with other specified complication: Secondary | ICD-10-CM

## 2020-04-27 DIAGNOSIS — E785 Hyperlipidemia, unspecified: Secondary | ICD-10-CM

## 2020-04-27 MED ORDER — VITAMIN D (ERGOCALCIFEROL) 1.25 MG (50000 UNIT) PO CAPS: 50000.0000 [IU] | ORAL_CAPSULE | ORAL | 0 refills | Status: DC

## 2020-05-03 ENCOUNTER — Encounter: Payer: Self-pay | Admitting: Physician Assistant

## 2020-05-04 ENCOUNTER — Other Ambulatory Visit (HOSPITAL_BASED_OUTPATIENT_CLINIC_OR_DEPARTMENT_OTHER): Payer: Self-pay | Admitting: Internal Medicine

## 2020-05-04 ENCOUNTER — Ambulatory Visit: Payer: BC Managed Care – PPO | Attending: Internal Medicine

## 2020-05-04 DIAGNOSIS — Z23 Encounter for immunization: Secondary | ICD-10-CM

## 2020-05-04 MED FILL — PFIZER-BIONTECH COVID-19 VA: 30 | 1 days supply | Qty: 0 | Fill #0

## 2020-05-04 NOTE — Progress Notes (Signed)
° °  Covid-19 Vaccination Clinic  Name:  Kyle Miller    MRN: [id number] DOB: 06/04/1976  05/04/2020  [name] was observed post Covid-19 immunization for 15 minutes without incident. He was provided with Vaccine Information Sheet and instruction to access the V-Safe system.   [name] was instructed to call 911 with any severe reactions post vaccine:  Difficulty breathing   Swelling of face and throat   A fast heartbeat   A bad rash all over body   Dizziness and weakness   Immunizations Administered    Name Date Dose VIS Date Route   Pfizer COVID-19 Vaccine 05/04/2020  9:31 AM 0.3 mL 03/24/2020 Intramuscular   Manufacturer: [place]   Lot: [id number]   [place]: [address]-5

## 2020-06-17 ENCOUNTER — Other Ambulatory Visit: Payer: Self-pay | Admitting: Physician Assistant

## 2020-06-24 ENCOUNTER — Other Ambulatory Visit: Payer: Self-pay | Admitting: Physician Assistant

## 2020-06-24 DIAGNOSIS — G4700 Insomnia, unspecified: Secondary | ICD-10-CM

## 2020-06-24 NOTE — Telephone Encounter (Signed)
Last OV 04/26/20 Last refill 04/19/20 #30/1 Next OV not scheduled

## 2020-07-14 ENCOUNTER — Encounter: Payer: Self-pay | Admitting: Physician Assistant

## 2020-07-14 MED ORDER — VITAMIN D (ERGOCALCIFEROL) 1.25 MG (50000 UNIT) PO CAPS: 50000.0000 [IU] | ORAL_CAPSULE | ORAL | 0 refills | Status: DC

## 2020-07-29 ENCOUNTER — Encounter: Payer: Self-pay | Admitting: Physician Assistant

## 2020-07-30 ENCOUNTER — Other Ambulatory Visit: Payer: Self-pay | Admitting: Family

## 2020-07-30 MED ORDER — METAXALONE 800 MG PO TABS: 800.0000 mg | ORAL_TABLET | Freq: Three times a day (TID) | ORAL | 1 refills | Status: DC

## 2020-07-30 NOTE — Telephone Encounter (Signed)
Please advise of different medication Patient was last seen 04/26/20 CPE

## 2020-08-28 ENCOUNTER — Other Ambulatory Visit: Payer: Self-pay | Admitting: Physician Assistant

## 2020-08-28 DIAGNOSIS — G4700 Insomnia, unspecified: Secondary | ICD-10-CM

## 2020-09-01 ENCOUNTER — Other Ambulatory Visit: Payer: Self-pay | Admitting: Family

## 2020-09-01 ENCOUNTER — Encounter: Payer: Self-pay | Admitting: Physician Assistant

## 2020-09-01 DIAGNOSIS — G4700 Insomnia, unspecified: Secondary | ICD-10-CM

## 2020-09-01 MED ORDER — TEMAZEPAM 15 MG PO CAPS: ORAL_CAPSULE | ORAL | 1 refills | Status: DC

## 2020-09-01 NOTE — Telephone Encounter (Signed)
Pt requesting temazepam.  Pt advised to make appt with current providers but is requesting refill till then

## 2020-09-29 ENCOUNTER — Other Ambulatory Visit: Payer: Self-pay

## 2020-09-30 ENCOUNTER — Other Ambulatory Visit: Payer: Self-pay

## 2020-09-30 ENCOUNTER — Encounter: Payer: Self-pay | Admitting: Medical

## 2020-09-30 ENCOUNTER — Ambulatory Visit: Payer: BC Managed Care – PPO | Admitting: Medical

## 2020-09-30 VITALS — BP 140/88 | HR 89 | Temp 98.1°F | Resp 18 | Ht 73.0 in | Wt 220.0 lb

## 2020-09-30 DIAGNOSIS — R5383 Other fatigue: Secondary | ICD-10-CM | POA: Diagnosis not present

## 2020-09-30 DIAGNOSIS — M545 Low back pain, unspecified: Secondary | ICD-10-CM

## 2020-09-30 DIAGNOSIS — K219 Gastro-esophageal reflux disease without esophagitis: Secondary | ICD-10-CM | POA: Diagnosis not present

## 2020-09-30 DIAGNOSIS — E559 Vitamin D deficiency, unspecified: Secondary | ICD-10-CM | POA: Diagnosis not present

## 2020-09-30 DIAGNOSIS — Z1211 Encounter for screening for malignant neoplasm of colon: Secondary | ICD-10-CM

## 2020-09-30 MED ORDER — FLOVENT HFA 110 MCG/ACT IN AERO: 2.0000 | INHALATION_SPRAY | Freq: Two times a day (BID) | RESPIRATORY_TRACT | 3 refills | Status: DC

## 2020-09-30 MED ORDER — CELECOXIB 100 MG PO CAPS: 100.0000 mg | ORAL_CAPSULE | Freq: Two times a day (BID) | ORAL | 0 refills | Status: DC

## 2020-09-30 NOTE — Progress Notes (Signed)
Subjective:    Patient ID: Kyle Miller, male    DOB: August 04, 1975, 45 y.o.   MRN: [id number]  HPI  Pt in to establish care.   Hx of gerd/reflux for 8-9 years. When uses protonix symptoms area controlled.  Hx of back pain. Controlled with epidurals. Last one done in October. Also uses relafen. Rare use of flexeril.  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 Potential for neural compression on both sides.  L5-S1: Shallow broad-based disc herniation contacts the thecal sac and S1 nerve roots. Neural compression is not demonstrated but this could cause neural irritation.  Hx of asthma. He states usually just uses albuterol 4-5 times a month. But recently over past month using twice a day. Some nasal congestion and itching eyes.  Pt states history of low vit D in past. Pt states feels tired.  Pt never had colonoscopy. No family hx of polyp or colon cancer.  Review of Systems  Constitutional: Positive for fatigue. Negative for chills and fever.  Respiratory: Positive for wheezing. Negative for cough, chest tightness and shortness of breath.   Cardiovascular: Negative for chest pain and palpitations.  Gastrointestinal: Negative for abdominal pain, constipation, diarrhea and vomiting.  Genitourinary: Negative for dysuria and frequency.  Musculoskeletal: Positive for back pain. Negative for gait problem.       Hx of.  Neurological: Negative for dizziness, seizures, weakness and headaches.  Hematological: Negative for adenopathy. Does not bruise/bleed easily.  Psychiatric/Behavioral: Negative for behavioral problems, confusion and dysphoric mood. The patient is not nervous/anxious.      Past Medical History:  Diagnosis Date  . Asthma   . Bulging lumbar disc   . Depression   . GERD (gastroesophageal reflux disease)   . History of chicken pox      Social History   Socioeconomic History  . Marital status: Married    Spouse name: Not on  file  . Number of children: Not on file  . Years of education: Not on file  . Highest education level: Not on file  Occupational History  . Not on file  Tobacco Use  . Smoking status: Never Smoker  . Smokeless tobacco: Never Used  Vaping Use  . Vaping Use: Never used  Substance and Sexual Activity  . Alcohol use: No    Alcohol/week: 0.0 standard drinks  . Drug use: No  . Sexual activity: Yes    Partners: Female  Other Topics Concern  . Not on file  Social History Narrative   He live with wife and youngest daughter.   He works as a Civil Service fast streamer.   Highest level of education:  Secretary/administrator, BS   Social Determinants of Health   Financial Resource Strain: Not on file  Food Insecurity: Not on file  Transportation Needs: Not on file  Physical Activity: Not on file  Stress: Not on file  Social Connections: Not on file  Intimate Partner Violence: Not on file    Past Surgical History:  Procedure Laterality Date  . WISDOM TOOTH EXTRACTION      Family History  Problem Relation Age of Onset  . Healthy Mother        Living  . Healthy Father        Living  . Thyroid disease Brother   . Asthma Daughter     Allergies  Allergen Reactions  . Hydrocodone Itching and Other (See Comments)    Hypersensitivity with whole pill    Current Outpatient Medications on File  Prior to Visit  Medication Sig Dispense Refill  . albuterol (VENTOLIN HFA) 108 (90 Base) MCG/ACT inhaler TAKE 2 PUFFS BY MOUTH EVERY 6 HOURS AS NEEDED FOR WHEEZE OR SHORTNESS OF BREATH 18 each 3  . COVID-19 mRNA vaccine, Pfizer, 30 MCG/0.3ML injection INJECT AS DIRECTED .3 mL 0  . cyclobenzaprine (FLEXERIL) 5 MG tablet Take by mouth.    . escitalopram (LEXAPRO) 5 MG tablet Take 1 tablet (5 mg total) by mouth daily. 30 tablet 0  . Melatonin 5 MG TABS Take by mouth.    . metaxalone (SKELAXIN) 800 MG tablet Take 1 tablet (800 mg total) by mouth 3 (three) times daily. 60 tablet 1  . nabumetone (RELAFEN) 500 MG tablet Take  500 mg by mouth 2 (two) times daily.    . pantoprazole (PROTONIX) 40 MG tablet TAKE 1 TABLET BY MOUTH EVERY DAY 90 tablet 1  . temazepam (RESTORIL) 15 MG capsule TAKE 1 CAPSULE BY MOUTH AT BEDTIME AS NEEDED FOR SLEEP.*MAX 45/75 DAYS* 30 capsule 1  . Vitamin D, Ergocalciferol, (DRISDOL) 1.25 MG (50000 UNIT) CAPS capsule Take 1 capsule (50,000 Units total) by mouth every 7 (seven) days. 12 capsule 0   No current facility-administered medications on file prior to visit.    BP (!) 146/97   Pulse 89   Temp 98.1 F (36.7 C)   Resp 18   Ht 6\' 1"  (1.854 m)   Wt 220 lb (99.8 kg)   SpO2 100%   BMI 29.03 kg/m        Objective:   Physical Exam  General Mental Status- Alert. General Appearance- Not in acute distress.   Skin General: Color- Normal Color. Moisture- Normal Moisture.  Neck Carotid Arteries- Normal color. Moisture- Normal Moisture. No carotid bruits. No JVD.  Chest and Lung Exam Auscultation: Breath Sounds:-Normal.  Cardiovascular Auscultation:Rythm- Regular. Murmurs & Other Heart Sounds:Auscultation of the heart reveals- No Murmurs.  Abdomen Inspection:-Inspeection Normal. Palpation/Percussion:Note:No mass. Palpation and Percussion of the abdomen reveal- Non Tender, Non Distended + BS, no rebound or guarding.   Neurologic Cranial Nerve exam:- CN III-XII intact(No nystagmus), symmetric smile. Strength:- 5/5 equal and symmetric strength both upper and lower extremities.      Assessment & Plan:  History of GERD well controlled with Protonix.  Low back history with radiating features and herniated disc.  Continue with neurosurgeon for periodic epidurals.  After discussion recommend stopping Relafen and trying Celebrex for pain.  Continue periodic use of Flexeril.  For fatigue and history of vitamin D deficiency, place order for vitamin D, B12 and B1.  Asked staff to place order for Cologuard/screening for colon cancer.  On review appears history of mild  elevated blood pressures.  If BP is trending towards 140/90 or above then could prescribe low-dose amlodipine.  History of allergic rhinitis with recent increase in wheezing/asthma.  Did prescribe Flovent inhaler to use daily basis and continue albuterol if needed.  Also recommend Flonase nasal spray in am  and irrigate nose with saline nasal spray at night.  Follow-up date to be determined after lab review.  Mackie Pai, PA-C

## 2020-09-30 NOTE — Patient Instructions (Signed)
History of GERD well controlled with Protonix.  Low back history with radiating features and herniated disc.  Continue with neurosurgeon for periodic epidurals.  After discussion recommend stopping Relafen and trying Celebrex for pain.  Continue periodic use of Flexeril.  For fatigue and history of vitamin D deficiency, place order for vitamin D, B12 and B1.  Asked staff to place order for Cologuard/screening for colon cancer.  On review appears history of mild elevated blood pressures.  If BP is trending towards 140/90 or above then could prescribe low-dose amlodipine.  History of allergic rhinitis with recent increase in wheezing/asthma.  Did prescribe Flovent inhaler to use daily basis and continue albuterol if needed.  Also recommend Flonase nasal spray in am  and irrigate nose with saline nasal spray at night.  Follow-up date to be determined after lab review.

## 2020-09-30 NOTE — Addendum Note (Signed)
Addended by: Jeronimo Greaves on: 09/30/2020 12:54 PM   Modules accepted: Orders

## 2020-10-01 LAB — VITAMIN B12: Vitamin B-12: 332 pg/mL (ref 211–911)

## 2020-10-04 ENCOUNTER — Encounter: Payer: Self-pay | Admitting: Medical

## 2020-10-05 ENCOUNTER — Telehealth: Payer: Self-pay | Admitting: Medical

## 2020-10-05 LAB — VITAMIN B1: Vitamin B1 (Thiamine): 11 nmol/L (ref 8–30)

## 2020-10-05 LAB — VITAMIN D 1,25 DIHYDROXY
Vitamin D 1, 25 (OH)2 Total: 26 pg/mL (ref 18–72)
Vitamin D2 1, 25 (OH)2: 13 pg/mL
Vitamin D3 1, 25 (OH)2: 13 pg/mL

## 2020-10-05 MED ORDER — VITAMIN D (ERGOCALCIFEROL) 1.25 MG (50000 UNIT) PO CAPS: 50000.0000 [IU] | ORAL_CAPSULE | ORAL | 3 refills | Status: DC

## 2020-10-05 NOTE — Telephone Encounter (Signed)
Rx vit D sent to pt pharmacy. 

## 2020-10-28 ENCOUNTER — Other Ambulatory Visit: Payer: Self-pay | Admitting: Medical

## 2020-10-28 NOTE — Telephone Encounter (Signed)
Do you still want to continue?  Last visit was 09/30/20.  He just started this.

## 2020-10-29 NOTE — Telephone Encounter (Signed)
30 tab rx celebrex sent to pt phamacy. Have him follow up in 2 weeks. Want to discuss how back is doing and discuss plan going forwared.

## 2020-11-03 NOTE — Telephone Encounter (Signed)
Pt called and lvm to return call 

## 2020-11-03 NOTE — Telephone Encounter (Signed)
Patient declined office visit, states he will call and scheduled a virtual

## 2020-11-05 ENCOUNTER — Other Ambulatory Visit: Payer: Self-pay | Admitting: Family

## 2020-11-05 DIAGNOSIS — G4700 Insomnia, unspecified: Secondary | ICD-10-CM

## 2020-11-10 ENCOUNTER — Ambulatory Visit: Payer: BC Managed Care – PPO | Admitting: Medical

## 2020-11-10 ENCOUNTER — Other Ambulatory Visit: Payer: Self-pay

## 2020-11-10 VITALS — BP 140/80 | HR 84 | Resp 20 | Ht 73.0 in | Wt 221.4 lb

## 2020-11-10 DIAGNOSIS — Z1211 Encounter for screening for malignant neoplasm of colon: Secondary | ICD-10-CM

## 2020-11-10 DIAGNOSIS — H9202 Otalgia, left ear: Secondary | ICD-10-CM

## 2020-11-10 DIAGNOSIS — J4540 Moderate persistent asthma, uncomplicated: Secondary | ICD-10-CM

## 2020-11-10 DIAGNOSIS — R6882 Decreased libido: Secondary | ICD-10-CM

## 2020-11-10 DIAGNOSIS — H60502 Unspecified acute noninfective otitis externa, left ear: Secondary | ICD-10-CM

## 2020-11-10 DIAGNOSIS — H6690 Otitis media, unspecified, unspecified ear: Secondary | ICD-10-CM

## 2020-11-10 DIAGNOSIS — G4700 Insomnia, unspecified: Secondary | ICD-10-CM

## 2020-11-10 DIAGNOSIS — R7989 Other specified abnormal findings of blood chemistry: Secondary | ICD-10-CM

## 2020-11-10 DIAGNOSIS — R5383 Other fatigue: Secondary | ICD-10-CM

## 2020-11-10 MED ORDER — ALBUTEROL SULFATE HFA 108 (90 BASE) MCG/ACT IN AERS: INHALATION_SPRAY | RESPIRATORY_TRACT | 3 refills | Status: DC

## 2020-11-10 MED ORDER — TRAZODONE HCL 50 MG PO TABS: 25.0000 mg | ORAL_TABLET | Freq: Every evening | ORAL | 3 refills | Status: DC | PRN

## 2020-11-10 MED ORDER — NEOMYCIN-POLYMYXIN-HC 3.5-10000-1 OT SOLN: 3.0000 [drp] | Freq: Four times a day (QID) | OTIC | 0 refills | Status: DC

## 2020-11-10 MED ORDER — AMOXICILLIN-POT CLAVULANATE 875-125 MG PO TABS: 1.0000 | ORAL_TABLET | Freq: Two times a day (BID) | ORAL | 0 refills | Status: DC

## 2020-11-10 NOTE — Progress Notes (Addendum)
Subjective:    Patient ID: Kyle Miller, male    DOB: [birth year], 45 y.o.   MRN: [id number]  HPI  Pt in with 3-4 days of ear pain. Was itching and he did use q tip. He got some dark colored wax. Ear was itching before he used q tip. Pt describes some flakiness inside ear in past. Has some sinus pressure as well.  Pt did get at home kit cologuard. He changed mind and wants to get colonoscopy.  Pt has history of insomnia. Pt in the past he use restoril. Pt insurance will only pay for 15 tab per month. He has been out of med for 5 days.   Pt has been using flovent daily. He needs refill of albuterol.  Pt states he is getting very hot at night. He states loss of libido and feeling fatigue.  Lower end b12. He has started 5000 mcg daily.     Review of Systems  Constitutional: Negative for chills, fatigue and fever.  HENT: Positive for ear pain. Negative for congestion.   Respiratory: Negative for cough, chest tightness, shortness of breath and wheezing.        Wheezing controlled.  Cardiovascular: Negative for chest pain and palpitations.  Gastrointestinal: Negative for abdominal pain, blood in stool, constipation and vomiting.  Genitourinary: Negative for dysuria.  Musculoskeletal: Negative for back pain.  Skin: Negative for rash.  Neurological: Negative for dizziness, numbness and headaches.  Psychiatric/Behavioral: Positive for sleep disturbance. Negative for dysphoric mood, hallucinations and suicidal ideas. The patient is nervous/anxious.     Past Medical History:  Diagnosis Date   Asthma    Bulging lumbar disc    Depression    GERD (gastroesophageal reflux disease)    History of chicken pox      Social History   Socioeconomic History   Marital status: Married    Spouse name: Not on file   Number of children: Not on file   Years of education: Not on file   Highest education level: Not on file  Occupational History   Not on file  Tobacco Use   Smoking status:  Never Smoker   Smokeless tobacco: Never Used  Vaping Use   Vaping Use: Never used  Substance and Sexual Activity   Alcohol use: No    Alcohol/week: 0.0 standard drinks   Drug use: No   Sexual activity: Yes    Partners: Female  Other Topics Concern   Not on file  Social History Narrative   He live with wife and youngest daughter.   He works as a [employer] fast streamer.   Highest level of education:  Secretary/administrator, BS   Social Determinants of Health   Financial Resource Strain: Not on file  Food Insecurity: Not on file  Transportation Needs: Not on file  Physical Activity: Not on file  Stress: Not on file  Social Connections: Not on file  Intimate Partner Violence: Not on file    Past Surgical History:  Procedure Laterality Date   WISDOM TOOTH EXTRACTION      Family History  Problem Relation Age of Onset   Healthy Mother        Living   Healthy Father        Living   Thyroid disease Brother    Asthma Daughter     Allergies  Allergen Reactions   Hydrocodone Itching and Other (See Comments)    Hypersensitivity with whole pill    Current Outpatient Medications on File Prior to Visit  Medication Sig Dispense Refill   celecoxib (CELEBREX) 100 MG capsule TAKE 1 CAPSULE BY MOUTH TWICE A DAY 30 capsule 0   COVID-19 mRNA vaccine, Pfizer, 30 MCG/0.3ML injection INJECT AS DIRECTED .3 mL 0   escitalopram (LEXAPRO) 5 MG tablet Take 1 tablet (5 mg total) by mouth daily. 30 tablet 0   fluticasone (FLOVENT HFA) 110 MCG/ACT inhaler Inhale 2 puffs into the lungs 2 (two) times daily. 1 each 3   Melatonin 5 MG TABS Take by mouth.     metaxalone (SKELAXIN) 800 MG tablet Take 1 tablet (800 mg total) by mouth 3 (three) times daily. 60 tablet 1   pantoprazole (PROTONIX) 40 MG tablet TAKE 1 TABLET BY MOUTH EVERY DAY 90 tablet 1   temazepam (RESTORIL) 15 MG capsule TAKE 1 CAPSULE BY MOUTH AT BEDTIME AS NEEDED FOR SLEEP.*MAX 45/75 DAYS* 30 capsule 1   Vitamin D, Ergocalciferol, (DRISDOL) 1.25 MG (50000  UNIT) CAPS capsule Take 1 capsule (50,000 Units total) by mouth every 7 (seven) days. 12 capsule 3   No current facility-administered medications on file prior to visit.    BP 140/80   Pulse 84   Resp 20   Ht 6' 1"  (1.854 m)   Wt 221 lb 6.4 oz (100.4 kg)   SpO2 99%   BMI 29.21 kg/m       Objective:   Physical Exam  General  Mental Status - Alert. General Appearance - Well groomed. Not in acute distress.  Skin Rashes- No Rashes.  HEENT Head- Normal. Ear Rt canal clear and normal tm. Left side canal mild swollen and about 1/2 of time moderate bright. Eye Sclera/Conjunctiva- Left- Normal. Right- Normal. Nose & Sinuses Nasal Mucosa- Left- not  Boggy and Congested. Right-  Not Boggy and  Congested.Bilateral no maxillary and no  frontal sinus pressure.   Neck Neck- Supple. No Masses.   Chest and Lung Exam Auscultation: Breath Sounds:-Clear even and unlabored.  Cardiovascular Auscultation:Rythm- Regular, rate and rhythm. Murmurs & Other Heart Sounds:Ausculatation of the heart reveal- No Murmurs.  Lymphatic Head & Neck General Head & Neck Lymphatics: Bilateral: Description- No Localized lymphadenopathy.       Assessment & Plan:  Recent left ear discomfort and on exam appears that you have left otitis externa with infection of tympanic membrane.  Wax looks like it has been cleared except for scant dry flaky wax. Rx augmentin and cortisporin otic drops.   Low libido, fatigue and remote history of low testosterone level.  Placed future testosterone panel to be done early in the morning.  Also will include B12 level since you had lower range B12 about 2 months ago.  Placed GI referral for screening colonoscopy.  Asthma symptoms have improved with use of Flovent.  Refilled your albuterol.  History of insomnia but insurance limited your use of Restoril.  Sent in prescription of trazodone.  Rx advisement given.  Since you are on Lexapro giving information regarding  serotonin syndrome.  Very unlikely this would occur but if it were to occur stop both trazodone and Lexapro.  Notify us as well.  Follow-up in 3 months or as needed.  Mackie Pai, PA-C

## 2020-11-10 NOTE — Patient Instructions (Addendum)
Recent left ear discomfort and on exam appears that you have left otitis externa with infection of tympanic membrane.  Wax looks like it has been cleared except for scant dry flaky wax.  Rx augmentin and cortisporin otic drops.   Low libido, fatigue and remote history of low testosterone level.  Placed future testosterone panel to be done early in the morning.  Also will include B12 level since you had lower range B12 about 2 months ago.  Placed GI referral for screening colonoscopy.  Asthma symptoms have improved with use of Flovent.  Refilled your albuterol.  History of insomnia but insurance limited your use of Restoril.  Sent in prescription of trazodone.  Rx advisement given.  Since you are on Lexapro giving information regarding serotonin syndrome.  Very unlikely this would occur but if it were to occur stop both trazodone and Lexapro.  Notify us as well.  Follow-up in 3 months or as needed.   Serotonin Syndrome Serotonin is a chemical in your body (neurotransmitter) that helps to control several functions, such as: Brain and nerve cell function. Mood and emotions. Memory. Eating. Sleeping. Sexual activity. Stress response. Having too much serotonin in your body can cause serotonin syndrome. This condition can be harmful to your brain and nerve cells. This can be a life-threatening condition. What are the causes? This condition may be caused by taking medicines or drugs that increase the level of serotonin in your body, such as: Antidepressant medicines. Migraine medicines. Certain pain medicines. Certain drugs, including ecstasy, LSD, cocaine, and amphetamines. Over-the-counter cough or cold medicines that contain dextromethorphan. Certain herbal supplements, including St. John's wort, ginseng, and nutmeg. This condition usually occurs when you take these medicines or drugs in combination, but it can also happen with a high dose of a single medicine or drug. What increases the  risk? You are more likely to develop this condition if: You just started taking a medicine or drug that increases the level of serotonin in the body. You recently increased the dose of a medicine or drug that increases the level of serotonin in the body. You take more than one medicine or drug that increases the level of serotonin in the body. What are the signs or symptoms? Symptoms of this condition usually start within several hours of taking a medicine or drug. Symptoms may be mild or severe. Mild symptoms include: Sweating. Restlessness or agitation. Muscle twitching or stiffness. Rapid heart rate. Nausea and vomiting. Diarrhea. Headache. Shivering or goose bumps. Confusion. Severe symptoms include: Irregular heartbeat. Seizures. Loss of consciousness. High fever. How is this diagnosed? This condition may be diagnosed based on: Your medical history.  A physical exam. Your prior use of drugs and medicines. Blood or urine tests. These may be used to rule out other causes of your symptoms. How is this treated? The treatment for this condition depends on the severity of your symptoms. For mild cases, stopping the medicine or drug that caused your condition is usually all that is needed. For moderate to severe cases, treatment in a hospital may be needed to prevent or manage life-threatening symptoms. This may include medicines to control your symptoms, IV fluids, interventions to support your breathing, and treatments to control your body temperature. Follow these instructions at home: Medicines Take over-the-counter and prescription medicines only as told by your health care provider. This is important. Check with your health care provider before you start taking any new prescriptions, over-the-counter medicines, herbs, or supplements. Avoid combining any medicines that  can cause this condition to occur.   Lifestyle Maintain a healthy lifestyle. Eat a healthy diet that  includes plenty of vegetables, fruits, whole grains, low-fat dairy products, and lean protein. Do not eat a lot of foods that are high in fat, added sugars, or salt. Get the right amount and quality of sleep. Most adults need 7-9 hours of sleep each night. Make time to exercise, even if it is only for short periods of time. Most adults should exercise for at least 150 minutes each week. Do not drink alcohol. Do not use illegal drugs, and do not take medicines for reasons other than they are prescribed.   General instructions Do not use any products that contain nicotine or tobacco, such as cigarettes and e-cigarettes. If you need help quitting, ask your health care provider. Keep all follow-up visits as told by your health care provider. This is important. Contact a health care provider if: Your symptoms do not improve or they get worse. Get help right away if you: Have worsening confusion, severe headache, chest pain, high fever, seizures, or loss of consciousness. Experience serious side effects of medicine, such as swelling of your face, lips, tongue, or throat. Have serious thoughts about hurting yourself or others. These symptoms may represent a serious problem that is an emergency. Do not wait to see if the symptoms will go away. Get medical help right away. Call your local emergency services (911 in the [place] Do not drive yourself to the hospital. If you ever feel like you may hurt yourself or others, or have thoughts about taking your own life, get help right away. You can go to your nearest emergency department or call: Your local emergency services (911 in the [place] A suicide crisis helpline, such as the [facility] at 978-395-2896. This is open 24 hours a day. Summary Serotonin is a brain chemical that helps to regulate the nervous system. High levels of serotonin in the body can cause serotonin syndrome, which is a very dangerous condition. This condition  may be caused by taking medicines or drugs that increase the level of serotonin in your body. Treatment depends on the severity of your symptoms. For mild cases, stopping the medicine or drug that caused your condition is usually all that is needed. Check with your health care provider before you start taking any new prescriptions, over-the-counter medicines, herbs, or supplements. This information is not intended to replace advice given to you by your health care provider. Make sure you discuss any questions you have with your health care provider. Document Revised: 06/29/2017 Document Reviewed: 06/29/2017 Elsevier Patient Education  2021 [employer].

## 2020-11-15 ENCOUNTER — Ambulatory Visit: Payer: BC Managed Care – PPO | Admitting: Medical

## 2020-11-18 ENCOUNTER — Other Ambulatory Visit (INDEPENDENT_AMBULATORY_CARE_PROVIDER_SITE_OTHER): Payer: BC Managed Care – PPO

## 2020-11-18 ENCOUNTER — Other Ambulatory Visit: Payer: Self-pay

## 2020-11-18 DIAGNOSIS — R5383 Other fatigue: Secondary | ICD-10-CM

## 2020-11-18 DIAGNOSIS — R6882 Decreased libido: Secondary | ICD-10-CM

## 2020-11-18 DIAGNOSIS — R7989 Other specified abnormal findings of blood chemistry: Secondary | ICD-10-CM

## 2020-11-18 LAB — VITAMIN B12: Vitamin B-12: >1550 pg/mL — ABNORMAL HIGH (ref 211–911)

## 2020-11-19 LAB — TESTOSTERONE TOTAL,FREE,BIO, MALES
Albumin: 4.1 g/dL (ref 3.6–5.1)
Sex Hormone Binding: 88 nmol/L — ABNORMAL HIGH (ref 10–50)
Testosterone, Bioavailable: 114.7 ng/dL (ref 110.0–575.0)
Testosterone, Free: 60.9 pg/mL (ref 46.0–224.0)
Testosterone: 1000 ng/dL — ABNORMAL HIGH (ref 250–827)

## 2020-12-02 ENCOUNTER — Other Ambulatory Visit: Payer: Self-pay | Admitting: Medical

## 2020-12-08 ENCOUNTER — Encounter: Payer: Self-pay | Admitting: Medical

## 2020-12-09 ENCOUNTER — Ambulatory Visit: Payer: BC Managed Care – PPO | Admitting: Medical

## 2020-12-10 ENCOUNTER — Other Ambulatory Visit: Payer: Self-pay | Admitting: Family Medicine

## 2020-12-15 ENCOUNTER — Encounter: Payer: Self-pay | Admitting: Medical

## 2020-12-15 ENCOUNTER — Telehealth: Payer: Self-pay | Admitting: Medical

## 2020-12-15 MED ORDER — PANTOPRAZOLE SODIUM 40 MG PO TBEC: 40.0000 mg | DELAYED_RELEASE_TABLET | Freq: Every day | ORAL | 1 refills | Status: DC

## 2020-12-15 MED ORDER — AZITHROMYCIN 250 MG PO TABS: ORAL_TABLET | ORAL | 0 refills | Status: AC

## 2020-12-15 MED ORDER — NEOMYCIN-POLYMYXIN-HC 3.5-10000-1 OT SOLN: 3.0000 [drp] | Freq: Four times a day (QID) | OTIC | 0 refills | Status: DC

## 2020-12-15 NOTE — Addendum Note (Signed)
Addended by: Anabel Halon on: 12/15/2020 04:47 PM   Modules accepted: Orders

## 2020-12-15 NOTE — Telephone Encounter (Signed)
Rx protonix sent.

## 2021-01-13 ENCOUNTER — Encounter: Payer: Self-pay | Admitting: Medical

## 2021-01-14 ENCOUNTER — Ambulatory Visit: Payer: BC Managed Care – PPO | Admitting: Medical

## 2021-01-14 ENCOUNTER — Other Ambulatory Visit: Payer: Self-pay

## 2021-01-14 VITALS — BP 136/91 | HR 90 | Resp 18 | Ht 73.0 in | Wt 219.8 lb

## 2021-01-14 DIAGNOSIS — F411 Generalized anxiety disorder: Secondary | ICD-10-CM

## 2021-01-14 DIAGNOSIS — R55 Syncope and collapse: Secondary | ICD-10-CM

## 2021-01-14 DIAGNOSIS — R9431 Abnormal electrocardiogram [ECG] [EKG]: Secondary | ICD-10-CM

## 2021-01-14 DIAGNOSIS — K219 Gastro-esophageal reflux disease without esophagitis: Secondary | ICD-10-CM

## 2021-01-14 DIAGNOSIS — F439 Reaction to severe stress, unspecified: Secondary | ICD-10-CM | POA: Diagnosis not present

## 2021-01-14 LAB — CBC WITH DIFFERENTIAL/PLATELET
Basophils Absolute: 0 10*3/uL (ref 0.0–0.1)
Basophils Relative: 0.5 % (ref 0.0–3.0)
Eosinophils Absolute: 0.1 10*3/uL (ref 0.0–0.7)
Eosinophils Relative: 0.9 % (ref 0.0–5.0)
HCT: 46.6 % (ref 39.0–52.0)
Hemoglobin: 15.3 g/dL (ref 13.0–17.0)
Lymphocytes Relative: 27 % (ref 12.0–46.0)
Lymphs Abs: 2 10*3/uL (ref 0.7–4.0)
MCHC: 32.8 g/dL (ref 30.0–36.0)
MCV: 87.8 fl (ref 78.0–100.0)
Monocytes Absolute: 0.7 10*3/uL (ref 0.1–1.0)
Monocytes Relative: 9.2 % (ref 3.0–12.0)
Neutro Abs: 4.5 10*3/uL (ref 1.4–7.7)
Neutrophils Relative %: 62.4 % (ref 43.0–77.0)
Platelets: 294 10*3/uL (ref 150.0–400.0)
RBC: 5.31 Mil/uL (ref 4.22–5.81)
RDW: 13.1 % (ref 11.5–15.5)
WBC: 7.2 10*3/uL (ref 4.0–10.5)

## 2021-01-14 LAB — COMPREHENSIVE METABOLIC PANEL
ALT: 31 U/L (ref 0–53)
AST: 20 U/L (ref 0–37)
Albumin: 4.6 g/dL (ref 3.5–5.2)
Alkaline Phosphatase: 99 U/L (ref 39–117)
BUN: 12 mg/dL (ref 6–23)
CO2: 29 mEq/L (ref 19–32)
Calcium: 10 mg/dL (ref 8.4–10.5)
Chloride: 101 mEq/L (ref 96–112)
Creatinine, Ser: 0.93 mg/dL (ref 0.40–1.50)
GFR: 98.39 mL/min (ref >60.00)
Glucose, Bld: 87 mg/dL (ref 70–99)
Potassium: 4.6 mEq/L (ref 3.5–5.1)
Sodium: 138 mEq/L (ref 135–145)
Total Bilirubin: 0.5 mg/dL (ref 0.2–1.2)
Total Protein: 7.3 g/dL (ref 6.0–8.3)

## 2021-01-14 LAB — TROPONIN I (HIGH SENSITIVITY): High Sens Troponin I: 5 ng/L (ref 2–17)

## 2021-01-14 LAB — LIPASE: Lipase: 19 U/L (ref 11.0–59.0)

## 2021-01-14 MED ORDER — LIDOCAINE VISCOUS HCL 2 % MT SOLN
15.0000 mL | Freq: Once | OROMUCOSAL | Status: AC
  Administered 2021-01-14: 15 mL via OROMUCOSAL

## 2021-01-14 MED ORDER — ALUM & MAG HYDROXIDE-SIMETH 200-200-20 MG/5ML PO SUSP
30.0000 mL | Freq: Once | ORAL | Status: AC
  Administered 2021-01-14: 30 mL via ORAL

## 2021-01-14 MED ORDER — CLONAZEPAM 0.5 MG PO TABS: 0.5000 mg | ORAL_TABLET | Freq: Two times a day (BID) | ORAL | 0 refills | Status: DC | PRN

## 2021-01-14 MED ORDER — HYOSCYAMINE SULFATE 0.125 MG SL SUBL
0.1250 mg | SUBLINGUAL_TABLET | Freq: Once | SUBLINGUAL | Status: AC
  Administered 2021-01-14: 0.125 mg via SUBLINGUAL

## 2021-01-14 MED ORDER — SUCRALFATE 1 G PO TABS: 1.0000 g | ORAL_TABLET | Freq: Two times a day (BID) | ORAL | 0 refills | Status: DC

## 2021-01-14 MED ORDER — FAMOTIDINE 20 MG PO TABS: 20.0000 mg | ORAL_TABLET | Freq: Two times a day (BID) | ORAL | 0 refills | Status: DC

## 2021-01-14 NOTE — Progress Notes (Signed)
Subjective:    Patient ID: Kyle Miller, male    DOB: Jul 25, 1975, 45 y.o.   MRN: [id number]  HPI  Pt is having varying days of abdomen pain. Pt states with his reflux he describes classic burning sensation upper chest. He belches a lot. In past he went to ED years ago   He states pain is on and off. Reflux burning almost every other day for 2 weeks. He admits eating very poorly. If eats poorly/fried foods  will get symptoms. If eats bland foods symptoms not present.   He states 4:30 this morning he was urinating and he states vision went away. Felt light head while standing but never passed out. Broke out in cold sweats. No chest pain or associated cardiac type signs or sympotms.  He woke up and he felt some better. Recent severe stress and worrying.     Review of Systems  Constitutional:  Negative for chills, fatigue and fever.  Respiratory:  Negative for cough, chest tightness, shortness of breath and wheezing.   Cardiovascular:  Negative for chest pain and palpitations.  Gastrointestinal:  Negative for abdominal pain and blood in stool.  Genitourinary:  Negative for dysuria, enuresis, flank pain and frequency.  Musculoskeletal:  Negative for back pain and myalgias.  Neurological:  Negative for dizziness, seizures, speech difficulty, weakness, light-headedness, numbness and headaches.       Near syncope this morning. No neuro signs or symptoms presently.  Hematological:  Negative for adenopathy. Does not bruise/bleed easily.  Psychiatric/Behavioral:  Negative for confusion, dysphoric mood, hallucinations, sleep disturbance and suicidal ideas. The patient is nervous/anxious.    Past Medical History:  Diagnosis Date   Asthma    Bulging lumbar disc    Depression    GERD (gastroesophageal reflux disease)    History of chicken pox      Social History   Socioeconomic History   Marital status: Married    Spouse name: Not on file   Number of children: Not on file   Years of  education: Not on file   Highest education level: Not on file  Occupational History   Not on file  Tobacco Use   Smoking status: Never   Smokeless tobacco: Never  Vaping Use   Vaping Use: Never used  Substance and Sexual Activity   Alcohol use: No    Alcohol/week: 0.0 standard drinks   Drug use: No   Sexual activity: Yes    Partners: Female  Other Topics Concern   Not on file  Social History Narrative   He live with wife and youngest daughter.   He works as a [employer] fast streamer.   Highest level of education:  Secretary/administrator, BS   Social Determinants of Health   Financial Resource Strain: Not on file  Food Insecurity: Not on file  Transportation Needs: Not on file  Physical Activity: Not on file  Stress: Not on file  Social Connections: Not on file  Intimate Partner Violence: Not on file    Past Surgical History:  Procedure Laterality Date   WISDOM TOOTH EXTRACTION      Family History  Problem Relation Age of Onset   Healthy Mother        Living   Healthy Father        Living   Thyroid disease Brother    Asthma Daughter     Allergies  Allergen Reactions   Hydrocodone Itching and Other (See Comments)    Hypersensitivity with whole pill  Current Outpatient Medications on File Prior to Visit  Medication Sig Dispense Refill   albuterol (VENTOLIN HFA) 108 (90 Base) MCG/ACT inhaler TAKE 2 PUFFS BY MOUTH EVERY 6 HOURS AS NEEDED FOR WHEEZE OR SHORTNESS OF BREATH 18 each 3   amoxicillin-clavulanate (AUGMENTIN) 875-125 MG tablet Take 1 tablet by mouth 2 (two) times daily. 20 tablet 0   celecoxib (CELEBREX) 100 MG capsule TAKE 1 CAPSULE BY MOUTH TWICE A DAY 30 capsule 0   COVID-19 mRNA vaccine, Pfizer, 30 MCG/0.3ML injection INJECT AS DIRECTED .3 mL 0   escitalopram (LEXAPRO) 5 MG tablet Take 1 tablet (5 mg total) by mouth daily. 30 tablet 0   fluticasone (FLOVENT HFA) 110 MCG/ACT inhaler Inhale 2 puffs into the lungs 2 (two) times daily. 1 each 3   Melatonin 5 MG TABS Take by  mouth.     metaxalone (SKELAXIN) 800 MG tablet Take 1 tablet (800 mg total) by mouth 3 (three) times daily. 60 tablet 1   neomycin-polymyxin-hydrocortisone (CORTISPORIN) OTIC solution Place 3 drops into the left ear 4 (four) times daily. 10 mL 0   pantoprazole (PROTONIX) 40 MG tablet Take 1 tablet (40 mg total) by mouth daily. 90 tablet 1   temazepam (RESTORIL) 15 MG capsule TAKE 1 CAPSULE BY MOUTH AT BEDTIME AS NEEDED FOR SLEEP.*MAX 45/75 DAYS* 30 capsule 1   traZODone (DESYREL) 50 MG tablet TAKE 1/2-1 TABLET BY MOUTH AT BEDTIME AS NEEDED FOR SLEEP. 90 tablet 2   Vitamin D, Ergocalciferol, (DRISDOL) 1.25 MG (50000 UNIT) CAPS capsule Take 1 capsule (50,000 Units total) by mouth every 7 (seven) days. 12 capsule 3   No current facility-administered medications on file prior to visit.    BP (!) 136/91   Pulse 90   Resp 18   Ht '6\' 1"'$  (1.854 m)   Wt 219 lb 12.8 oz (99.7 kg)   SpO2 100%   BMI 29.00 kg/m        Objective:   Physical Exam  General Mental Status- Alert. General Appearance- Not in acute distress.   Skin General: Color- Normal Color. Moisture- Normal Moisture.  Neck Carotid Arteries- Normal color. Moisture- Normal Moisture. No carotid bruits. No JVD.  Chest and Lung Exam Auscultation: Breath Sounds:-Normal.  Cardiovascular Auscultation:Rythm- Regular. Murmurs & Other Heart Sounds:Auscultation of the heart reveals- No Murmurs.  Abdomen Inspection:-Inspeection Normal. Palpation/Percussion:Note:No mass. Palpation and Percussion of the abdomen reveal- Non Tender, Non Distended + BS, no rebound or guarding.   Neurologic Cranial Nerve exam:- CN III-XII intact(No nystagmus), symmetric smile. Drift Test:- No drift. Finger to Nose:- Normal/Intact Strength:- 5/5 equal and symmetric strength both upper and lower extremities.       Assessment & Plan:   You do describe reflux/gerd type symptoms. Continue protonix and adding famotadine. If with both meds symptoms  not  improved add on sucraflate.  GI cocktail given in office today.  Will go ahead and refer you to GI MD.    Near syncope this morning. Possible vasovagal. Glad to hear did not pass out.   Decided to do ekg in office today did show nsr.  As we approach the weekend for caution sake will get troponin stat. Will evaluate remote/unlikely possibility near syncope heart related. Will also go ahead and refer you to cardiologist. Ekg shows sinus rhythm but short pr interval.   Note if pain cardiac like or if you have actual passing out episodes then be seen in the ED.  Recent stress/and increased anxiety. Will make clonazepam low dose  available to use if needed.     Mackie Pai, PA-C  Time spent with patient today was  45 minutes which consisted of chart revdiew, discussing diagnosis, work up treatment and documentation.

## 2021-01-14 NOTE — Patient Instructions (Addendum)
You do describe reflux/gerd type symptoms. Continue protonix and adding famotadine. If with both meds symptoms  not improved add on sucraflate.  GI cocktail given in office today.  Will go ahead and refer you to GI MD.    Near syncope this morning. Possible vasovagal. Glad to hear did not pass out.   Decided to do ekg in office today did show nsr.  As we approach the weekend for caution sake will get troponin stat. Will evaluate remote/unlikely possibility near syncope heart related. Will also go ahead and refer you to cardiologist. Ekg shows sinus rhythm but short pr interval.   Note if pain cardiac like or if you have actual passing out episodes then be seen in the ED.  Recent stress/and increased anxiety. Will make clonazepam low dose available to use if needed.  Follow up in 10 days or sooner if needed.

## 2021-01-20 ENCOUNTER — Encounter: Payer: Self-pay | Admitting: Medical

## 2021-01-24 ENCOUNTER — Ambulatory Visit: Payer: BC Managed Care – PPO | Admitting: Medical

## 2021-01-25 ENCOUNTER — Ambulatory Visit (INDEPENDENT_AMBULATORY_CARE_PROVIDER_SITE_OTHER): Payer: BC Managed Care – PPO | Admitting: Otolaryngology

## 2021-02-05 ENCOUNTER — Other Ambulatory Visit: Payer: Self-pay | Admitting: Medical

## 2021-02-16 ENCOUNTER — Encounter: Payer: Self-pay | Admitting: Medical

## 2021-02-16 DIAGNOSIS — Z1211 Encounter for screening for malignant neoplasm of colon: Secondary | ICD-10-CM

## 2021-02-22 ENCOUNTER — Other Ambulatory Visit: Payer: Self-pay | Admitting: Medical

## 2021-02-28 ENCOUNTER — Other Ambulatory Visit: Payer: Self-pay | Admitting: Medical

## 2021-02-28 DIAGNOSIS — K219 Gastro-esophageal reflux disease without esophagitis: Secondary | ICD-10-CM | POA: Insufficient documentation

## 2021-02-28 DIAGNOSIS — M5136 Other intervertebral disc degeneration, lumbar region: Secondary | ICD-10-CM | POA: Insufficient documentation

## 2021-02-28 DIAGNOSIS — Z8619 Personal history of other infectious and parasitic diseases: Secondary | ICD-10-CM | POA: Insufficient documentation

## 2021-02-28 DIAGNOSIS — M5126 Other intervertebral disc displacement, lumbar region: Secondary | ICD-10-CM | POA: Insufficient documentation

## 2021-03-07 ENCOUNTER — Ambulatory Visit: Payer: BC Managed Care – PPO | Admitting: Cardiology

## 2021-03-23 ENCOUNTER — Ambulatory Visit (INDEPENDENT_AMBULATORY_CARE_PROVIDER_SITE_OTHER): Payer: BC Managed Care – PPO | Admitting: Gastroenterology

## 2021-03-23 ENCOUNTER — Encounter: Payer: Self-pay | Admitting: Gastroenterology

## 2021-03-23 VITALS — BP 136/70 | HR 8 | Ht 73.0 in | Wt 216.0 lb

## 2021-03-23 DIAGNOSIS — Z1211 Encounter for screening for malignant neoplasm of colon: Secondary | ICD-10-CM | POA: Diagnosis not present

## 2021-03-23 DIAGNOSIS — K219 Gastro-esophageal reflux disease without esophagitis: Secondary | ICD-10-CM

## 2021-03-23 DIAGNOSIS — Z1212 Encounter for screening for malignant neoplasm of rectum: Secondary | ICD-10-CM | POA: Diagnosis not present

## 2021-03-23 MED ORDER — SUTAB 1479-225-188 MG PO TABS: 1.0000 | ORAL_TABLET | Freq: Once | ORAL | 0 refills | Status: AC

## 2021-03-23 NOTE — Progress Notes (Signed)
HPI : Kyle Miller is a very pleasant 45 year old male referred to [place] by Dr. Mackie Pai for average risk screening colonoscopy and for further management of chronic GERD symptoms.  The patient states that he has had GERD symptoms intermittently for a couple of years now.  His symptoms consist of burning pain in the chest and occasional acid regurgitation.  Symptoms are more noticeable in the evening.  He has been awoken from sleep on 1 occasion with severe acid regurgitation.  He used to take Protonix on a daily basis which controlled his symptoms completely, but was switched to famotidine and has been having more frequent symptoms since then.  Patient states that if you are not taking any medicine, he would most likely have symptoms at least 3 to 4 days/week.  He denies symptoms of dysphagia.  No weight loss.  No anemia.  No family history of esophageal cancer.  Symptoms are worse with certain foods such as spaghetti or fatty foods. He has no chronic lower GI symptoms.  He has never had an upper or lower endoscopy.   Past Medical History:  Diagnosis Date   Asthma    Bulging lumbar disc    Depression    GERD (gastroesophageal reflux disease)    History of chicken pox      Past Surgical History:  Procedure Laterality Date   WISDOM TOOTH EXTRACTION     Family History  Problem Relation Age of Onset   Healthy Mother        Living   Healthy Father        Living   Thyroid disease Brother    Asthma Daughter    Colon polyps Neg Hx    Colon cancer Neg Hx    Social History   Tobacco Use   Smoking status: Never   Smokeless tobacco: Never  Vaping Use   Vaping Use: Never used  Substance Use Topics   Alcohol use: No    Alcohol/week: 0.0 standard drinks   Drug use: No   Current Outpatient Medications  Medication Sig Dispense Refill   albuterol (VENTOLIN HFA) 108 (90 Base) MCG/ACT inhaler TAKE 2 PUFFS BY MOUTH EVERY 6 HOURS AS NEEDED FOR WHEEZE OR SHORTNESS OF BREATH 18 each 3    escitalopram (LEXAPRO) 5 MG tablet Take 1 tablet (5 mg total) by mouth daily. 30 tablet 0   famotidine (PEPCID) 20 MG tablet TAKE 1 TABLET BY MOUTH TWICE A DAY 180 tablet 1   Melatonin 5 MG TABS Take by mouth.     sucralfate (CARAFATE) 1 g tablet Take 1 tablet (1 g total) by mouth 2 (two) times daily. 60 tablet 0   traZODone (DESYREL) 50 MG tablet TAKE 1/2-1 TABLET BY MOUTH AT BEDTIME AS NEEDED FOR SLEEP. 90 tablet 2   Vitamin D, Ergocalciferol, (DRISDOL) 1.25 MG (50000 UNIT) CAPS capsule Take 1 capsule (50,000 Units total) by mouth every 7 (seven) days. 12 capsule 3   celecoxib (CELEBREX) 100 MG capsule TAKE 1 CAPSULE BY MOUTH TWICE A DAY (Patient not taking: Reported on 03/23/2021) 30 capsule 0   COVID-19 mRNA vaccine, Pfizer, 30 MCG/0.3ML injection INJECT AS DIRECTED .3 mL 0   pantoprazole (PROTONIX) 40 MG tablet Take 1 tablet (40 mg total) by mouth daily. (Patient not taking: Reported on 03/23/2021) 90 tablet 1   No current facility-administered medications for this visit.   Allergies  Allergen Reactions   Hydrocodone Itching and Other (See Comments)    Hypersensitivity with whole  pill     Review of Systems: All systems reviewed and negative except where noted in HPI.    No results found.  Physical Exam: BP 136/70   Pulse (!) 8   Ht 6\' 1"  (1.854 m)   Wt 216 lb (98 kg)   BMI 28.50 kg/m  Constitutional: Pleasant,well-developed, Caucasian male in no acute distress. HEENT: Normocephalic and atraumatic. Conjunctivae are normal. No scleral icterus. Cardiovascular: Normal rate, regular rhythm.  Pulmonary/chest: Effort normal and breath sounds normal. No wheezing, rales or rhonchi. Abdominal: Soft, nondistended, nontender. Bowel sounds active throughout. There are no masses palpable. No hepatomegaly. Extremities: no edema Neurological: Alert and oriented to person place and time. Skin: Skin is warm and dry. No rashes noted. Psychiatric: Normal mood and affect. Behavior is  normal.  CBC    Component Value Date/Time   WBC 7.2 01/14/2021 1252   RBC 5.31 01/14/2021 1252   HGB 15.3 01/14/2021 1252   HCT 46.6 01/14/2021 1252   PLT 294.0 01/14/2021 1252   MCV 87.8 01/14/2021 1252   MCHC 32.8 01/14/2021 1252   RDW 13.1 01/14/2021 1252   LYMPHSABS 2.0 01/14/2021 1252   MONOABS 0.7 01/14/2021 1252   EOSABS 0.1 01/14/2021 1252   BASOSABS 0.0 01/14/2021 1252    CMP     Component Value Date/Time   NA 138 01/14/2021 1252   K 4.6 01/14/2021 1252   CL 101 01/14/2021 1252   CO2 29 01/14/2021 1252   GLUCOSE 87 01/14/2021 1252   BUN 12 01/14/2021 1252   CREATININE 0.93 01/14/2021 1252   CALCIUM 10.0 01/14/2021 1252   PROT 7.3 01/14/2021 1252   ALBUMIN 4.6 01/14/2021 1252   AST 20 01/14/2021 1252   ALT 31 01/14/2021 1252   ALKPHOS 99 01/14/2021 1252   BILITOT 0.5 01/14/2021 1252     ASSESSMENT AND PLAN: 45 year old male with chronic GERD symptoms well controlled with once daily Protonix, suboptimally controlled with famotidine, due for initial screening colonoscopy.  He does not meet Barrett's screening criteria, and given good symptom control with PPI, no absolute indication for EGD.  Recommend the patient resume treatment with once daily Protonix, can increase to twice a day if having frequent breakthrough symptoms.  Will schedule for routine screening colonoscopy. We discussed the pathophysiology of GERD and the principles of GERD management to include lifestyle modifications  such as dietary discretion (avoidance of alcohol, tobacco, caffeinated and carbonated beverages, spicy/greasy foods, citrus, peppermint/chocolate), weight loss if applicable, head of bed elevation andconsuming last meal of day within 3 hours of bedtime; pharmacologic options to include PPIs, H2RAs and OTC antacids; and finally surgical or endoscopic fundoplication.   GERD - Stop famotidine, resume Protonix  CRC screening - Schedule for screening colonoscopy   The details, risks  (including bleeding, perforation, infection, missed lesions, medication reactions and possible hospitalization or surgery if complications occur), benefits, and alternatives to colonoscopy with possible biopsy and possible polypectomy were discussed with the patient and he consents to proceed.    Twilla Khouri E. Candis Schatz, MD [facility]   CC: Mackie Pai, [place]

## 2021-03-23 NOTE — Patient Instructions (Addendum)
If you are age 47 or older, your body mass index should be between 23-30. Your Body mass index is 28.5 kg/m. If this is out of the aforementioned range listed, please consider follow up with your Primary Care Provider.  If you are age 11 or younger, your body mass index should be between 19-25. Your Body mass index is 28.5 kg/m. If this is out of the aformentioned range listed, please consider follow up with your Primary Care Provider.   Take Protonix 40 mg twice daily for 1-2 months then take once daily. Use Famotidine /Carafate as needed.   You have been scheduled for a colonoscopy. Please follow written instructions given to you at your visit today.  Please pick up your prep supplies at the pharmacy within the next 1-3 days. If you use inhalers (even only as needed), please bring them with you on the day of your procedure.  The [facility] providers would like to encourage you to use [facility] to communicate with providers for non-urgent requests or questions.  Due to long hold times on the telephone, sending your provider a message by [facility] may be a faster and more efficient way to get a response.  Please allow 48 business hours for a response.  Please remember that this is for non-urgent requests.   It was a pleasure to see you today!  Thank you for trusting me with your gastrointestinal care!    Scott E.Candis Schatz, MD

## 2021-03-24 ENCOUNTER — Encounter: Payer: Self-pay | Admitting: Gastroenterology

## 2021-03-31 NOTE — Progress Notes (Signed)
Cardiology Office Note:    Date:  04/01/2021   ID:  Kyle Miller, DOB 12-08-1975, MRN [id number]  PCP:  Mackie Pai, PA-C  Cardiologist:  Shirlee More, MD   Referring MD: Mackie Pai, PA-C  ASSESSMENT:    1. Near syncope   2. Right bundle branch block   3. Elevated blood pressure reading    PLAN:    In order of problems listed above:  He has had 1 isolated episode occurred uprights during micturition did not lose consciousness and I do not think he requires an extensive evaluation.  I did tell him with his EKG finding incomplete right bundle branch block that we could do an event monitor and neither of [place] feel is necessary at this time unless he has recurrent episodes.  This episode was quite subtle He has incomplete right bundle branch block typically a variant of normal.  I think he should have yearly EKGs done with wellness exams He may well of hypertension I have asked him to use his digital blood pressure cuff described how to check his blood pressure good technique record numbers and may require antihypertensive therapy.  Next appointment as needed I encouraged him to purchase an apple watch to monitor his heart rate and to send me messages in my chart if needed   Medication Adjustments/Labs and Tests Ordered: Current medicines are reviewed at length with the patient today.  Concerns regarding medicines are outlined above.  No orders of the defined types were placed in this encounter.  No orders of the defined types were placed in this encounter.    Chief Complaint  Patient presents with   Near Syncope  He briefly lost vision while upright in the bathroom during urination without a feeling as if he would faint  History of Present Illness:    Kyle Miller is a 45 y.o. male who is being seen today for the evaluation of an episode of near syncope associated with micturition at the request of [place], Percell Miller, [place].  His EKG in August showed sinus rhythm  incomplete right bundle branch block He has a history of elevated blood pressure without a diagnosis of hypertension He has no background history of heart disease congenital rheumatic or heart murmur. He had an episode of briefly losing his vision while urinating momentary was not weak or unsteady was able to walk back and go to bed.  No associated palpitation he has not had a previous episode has never had syncope. He takes no over-the-counter proarrhythmic agents. He does have problems with sinus congestion. He has had no chest pain edema or shortness of breath.  Past Medical History:  Diagnosis Date   Asthma    Bulging lumbar disc    Depression    GERD (gastroesophageal reflux disease)    History of chicken pox     Past Surgical History:  Procedure Laterality Date   WISDOM TOOTH EXTRACTION      Current Medications: Current Meds  Medication Sig   albuterol (VENTOLIN HFA) 108 (90 Base) MCG/ACT inhaler TAKE 2 PUFFS BY MOUTH EVERY 6 HOURS AS NEEDED FOR WHEEZE OR SHORTNESS OF BREATH   celecoxib (CELEBREX) 100 MG capsule TAKE 1 CAPSULE BY MOUTH TWICE A DAY   escitalopram (LEXAPRO) 5 MG tablet Take 1 tablet (5 mg total) by mouth daily.   famotidine (PEPCID) 20 MG tablet TAKE 1 TABLET BY MOUTH TWICE A DAY   Melatonin 5 MG TABS Take by mouth at bedtime as needed (Sleep).  pantoprazole (PROTONIX) 40 MG tablet Take 1 tablet (40 mg total) by mouth daily.   sucralfate (CARAFATE) 1 g tablet Take 1 tablet (1 g total) by mouth 2 (two) times daily.   traZODone (DESYREL) 50 MG tablet TAKE 1/2-1 TABLET BY MOUTH AT BEDTIME AS NEEDED FOR SLEEP.   Vitamin D, Ergocalciferol, (DRISDOL) 1.25 MG (50000 UNIT) CAPS capsule Take 1 capsule (50,000 Units total) by mouth every 7 (seven) days.     Allergies:   Hydrocodone   Social History   Socioeconomic History   Marital status: Married    Spouse name: Not on file   Number of children: Not on file   Years of education: Not on file   Highest  education level: Not on file  Occupational History   Not on file  Tobacco Use   Smoking status: Never   Smokeless tobacco: Never  Vaping Use   Vaping Use: Never used  Substance and Sexual Activity   Alcohol use: No    Alcohol/week: 0.0 standard drinks   Drug use: No   Sexual activity: Yes    Partners: Female  Other Topics Concern   Not on file  Social History Narrative   He live with wife and youngest daughter.   He works as a Civil Service fast streamer.   Highest level of education:  Secretary/administrator, BS   Social Determinants of Health   Financial Resource Strain: Not on file  Food Insecurity: Not on file  Transportation Needs: Not on file  Physical Activity: Not on file  Stress: Not on file  Social Connections: Not on file     Family History: The patient's family history includes Asthma in his daughter; Healthy in his father and mother; Thyroid disease in his brother. There is no history of Colon polyps or Colon cancer.  ROS:   ROS Please see the history of present illness.     All other systems reviewed and are negative.  EKGs/Labs/Other Studies Reviewed:    The following studies were reviewed today:    Recent Labs: 04/26/2020: TSH 0.36 01/14/2021: ALT 31; BUN 12; Creatinine, Ser 0.93; Hemoglobin 15.3; Platelets 294.0; Potassium 4.6; Sodium 138  Recent Lipid Panel    Component Value Date/Time   CHOL 221 (H) 04/26/2020 0854   TRIG 107.0 04/26/2020 0854   HDL 43.40 04/26/2020 0854   CHOLHDL 5 04/26/2020 0854   VLDL 21.4 04/26/2020 0854   LDLCALC 156 (H) 04/26/2020 0854   LDLDIRECT 173.0 07/26/2016 1125    Physical Exam:    VS:  Ht 6\' 1"  (1.854 m)   Wt 218 lb (98.9 kg)   SpO2 98%   BMI 28.76 kg/m     Wt Readings from Last 3 Encounters:  04/01/21 218 lb (98.9 kg)  03/23/21 216 lb (98 kg)  01/14/21 219 lb 12.8 oz (99.7 kg)     GEN:  Well nourished, well developed in no acute distress HEENT: Normal NECK: No JVD; No carotid bruits LYMPHATICS: No lymphadenopathy CARDIAC:  RRR, no murmurs, rubs, gallops RESPIRATORY:  Clear to auscultation without rales, wheezing or rhonchi  ABDOMEN: Soft, non-tender, non-distended MUSCULOSKELETAL:  No edema; No deformity  SKIN: Warm and dry NEUROLOGIC:  Alert and oriented x 3 PSYCHIATRIC:  Normal affect     Signed, Shirlee More, MD  04/01/2021 9:01 AM    [place]

## 2021-04-01 ENCOUNTER — Encounter: Payer: Self-pay | Admitting: Cardiology

## 2021-04-01 ENCOUNTER — Other Ambulatory Visit: Payer: Self-pay

## 2021-04-01 ENCOUNTER — Ambulatory Visit: Payer: BC Managed Care – PPO | Admitting: Cardiology

## 2021-04-01 VITALS — BP 142/90 | Ht 73.0 in | Wt 218.0 lb

## 2021-04-01 DIAGNOSIS — I4510 Unspecified right bundle-branch block: Secondary | ICD-10-CM

## 2021-04-01 DIAGNOSIS — R030 Elevated blood-pressure reading, without diagnosis of hypertension: Secondary | ICD-10-CM

## 2021-04-01 DIAGNOSIS — R55 Syncope and collapse: Secondary | ICD-10-CM | POA: Diagnosis not present

## 2021-04-01 NOTE — Patient Instructions (Addendum)
Medication Instructions:  Your physician recommends that you continue on your current medications as directed. Please refer to the Current Medication list given to you today.  *If you need a refill on your cardiac medications before your next appointment, please call your pharmacy*   Lab Work: None If you have labs (blood work) drawn today and your tests are completely normal, you will receive your results only by: [place] (if you have MyChart) OR A paper copy in the mail If you have any lab test that is abnormal or we need to change your treatment, we will call you to review the results.   Testing/Procedures: None   Follow-Up: At [facility], you and your health needs are our priority.  As part of our continuing mission to provide you with exceptional heart care, we have created designated Provider Care Teams.  These Care Teams include your primary Cardiologist (physician) and Advanced Practice Providers (APPs -  Physician Assistants and Nurse Practitioners) who all work together to provide you with the care you need, when you need it.  We recommend signing up for the patient portal called "MyChart".  Sign up information is provided on this After Visit Summary.  MyChart is used to connect with patients for Virtual Visits (Telemedicine).  Patients are able to view lab/test results, encounter notes, upcoming appointments, etc.  Non-urgent messages can be sent to your provider as well.   To learn more about what you can do with MyChart, go to [email or web address].    Your next appointment:   As needed  The format for your next appointment:   In Person  Provider:   Shirlee More, MD   Other Instructions  [facility]beat  Tips to measure your blood pressure correctly  To determine whether you have hypertension, a medical professional will take a blood pressure reading. How you prepare for the test, the position of your arm, and other factors can change a blood pressure  reading by 10% or more. That could be enough to hide high blood pressure, start you on a drug you don't really need, or lead your doctor to incorrectly adjust your medications. National and international guidelines offer specific instructions for measuring blood pressure. If a doctor, nurse, or medical assistant isn't doing it right, don't hesitate to ask him or her to get with the guidelines. Here's what you can do to ensure a correct reading:  Don't drink a caffeinated beverage or smoke during the 30 minutes before the test.  Sit quietly for five minutes before the test begins.  During the measurement, sit in a chair with your feet on the floor and your arm supported so your elbow is at about heart level.  The inflatable part of the cuff should completely cover at least 80% of your upper arm, and the cuff should be placed on bare skin, not over a shirt.  Don't talk during the measurement.  Have your blood pressure measured twice, with a brief break in between. If the readings are different by 5 points or more, have it done a third time. There are times to break these rules. If you sometimes feel lightheaded when getting out of bed in the morning or when you stand after sitting, you should have your blood pressure checked while seated and then while standing to see if it falls from one position to the next. Because blood pressure varies throughout the day, your doctor will rarely diagnose hypertension on the basis of a single reading. Instead, he or she  will want to confirm the measurements on at least two occasions, usually within a few weeks of one another. The exception to this rule is if you have a blood pressure reading of 180/110 mm Hg or higher. A result this high usually calls for prompt treatment. It's also a good idea to have your blood pressure measured in both arms at least once, since the reading in one arm (usually the right) may be higher than that in the left. A 2014 study in The American  Journal of Medicine of nearly 3,400 people found average arm- to-arm differences in systolic blood pressure of about 5 points. The higher number should be used to make treatment decisions. In 2017, new guidelines from the [place], the [employer] of Cardiology, and nine other health organizations lowered the diagnosis of high blood pressure to 130/80 mm Hg or higher for all adults. The guidelines also redefined the various blood pressure categories to now include normal, elevated, Stage 1 hypertension, Stage 2 hypertension, and hypertensive crisis (see "Blood pressure categories"). Blood pressure categories  Blood pressure category SYSTOLIC (upper number)  DIASTOLIC (lower number)  Normal Less than 120 mm Hg and Less than 80 mm Hg  Elevated 120-129 mm Hg and Less than 80 mm Hg  High blood pressure: Stage 1 hypertension 130-139 mm Hg or 80-89 mm Hg  High blood pressure: Stage 2 hypertension 140 mm Hg or higher or 90 mm Hg or higher  Hypertensive crisis (consult your doctor immediately) Higher than 180 mm Hg and/or Higher than 120 mm Hg  Source: American Heart Association and American Stroke Association. For more on getting your blood pressure under control, buy Controlling Your Blood Pressure, a Special Health Report from [facility].

## 2021-04-19 ENCOUNTER — Encounter: Payer: Self-pay | Admitting: Medical

## 2021-04-24 ENCOUNTER — Other Ambulatory Visit: Payer: Self-pay | Admitting: Medical

## 2021-04-26 ENCOUNTER — Encounter: Payer: Self-pay | Admitting: Gastroenterology

## 2021-05-03 ENCOUNTER — Encounter: Payer: BC Managed Care – PPO | Admitting: Gastroenterology

## 2021-05-06 ENCOUNTER — Ambulatory Visit: Payer: BC Managed Care – PPO | Admitting: Medical

## 2021-05-06 VITALS — BP 144/86 | HR 80 | Temp 98.3°F | Resp 18 | Ht 73.0 in | Wt 219.8 lb

## 2021-05-06 DIAGNOSIS — K219 Gastro-esophageal reflux disease without esophagitis: Secondary | ICD-10-CM | POA: Diagnosis not present

## 2021-05-06 DIAGNOSIS — M65311 Trigger thumb, right thumb: Secondary | ICD-10-CM

## 2021-05-06 DIAGNOSIS — R030 Elevated blood-pressure reading, without diagnosis of hypertension: Secondary | ICD-10-CM

## 2021-05-06 DIAGNOSIS — M79644 Pain in right finger(s): Secondary | ICD-10-CM

## 2021-05-06 DIAGNOSIS — J300 Vasomotor rhinitis: Secondary | ICD-10-CM

## 2021-05-06 MED ORDER — AZELASTINE HCL 0.1 % NA SOLN: 2.0000 | Freq: Two times a day (BID) | NASAL | 3 refills | Status: DC

## 2021-05-06 NOTE — Progress Notes (Signed)
Subjective:    Patient ID: Kyle Miller, male    DOB: 01/22/1976, 45 y.o.   MRN: [id number]  HPI  Pt states his rt thumb feels like it is locking up for about one month. In the morning it get stuck and he will have to unlock/pop. He states after morning the thumb is more flexible.  Pt states at base of thumb had laceration that needed repair. Pt states at base of thumb will hurt at times.    This is pt dominant hand.  Hx of allergies and notes when he is exposed to temperature changes will get nasal congestions.    Review of Systems  Constitutional:  Negative for chills, fatigue and fever.  Respiratory:  Negative for cough, chest tightness, shortness of breath and wheezing.   Cardiovascular:  Negative for chest pain and palpitations.  Musculoskeletal:        Rt hand trigger finger and pain.  Neurological:  Negative for dizziness and headaches.  Hematological:  Negative for adenopathy. Does not bruise/bleed easily.  Psychiatric/Behavioral:  Negative for behavioral problems.    Past Medical History:  Diagnosis Date   Asthma    Bulging lumbar disc    Depression    GERD (gastroesophageal reflux disease)    History of chicken pox      Social History   Socioeconomic History   Marital status: Married    Spouse name: Not on file   Number of children: Not on file   Years of education: Not on file   Highest education level: Not on file  Occupational History   Not on file  Tobacco Use   Smoking status: Never   Smokeless tobacco: Never  Vaping Use   Vaping Use: Never used  Substance and Sexual Activity   Alcohol use: No    Alcohol/week: 0.0 standard drinks   Drug use: No   Sexual activity: Yes    Partners: Female  Other Topics Concern   Not on file  Social History Narrative   He live with wife and youngest daughter.   He works as a Civil Service fast streamer.   Highest level of education:  College, BS   Social Determinants of Health   Financial Resource Strain: Not on file   Food Insecurity: Not on file  Transportation Needs: Not on file  Physical Activity: Not on file  Stress: Not on file  Social Connections: Not on file  Intimate Partner Violence: Not on file    Past Surgical History:  Procedure Laterality Date   WISDOM TOOTH EXTRACTION      Family History  Problem Relation Age of Onset   Healthy Mother        Living   Healthy Father        Living   Thyroid disease Brother    Asthma Daughter    Colon polyps Neg Hx    Colon cancer Neg Hx     Allergies  Allergen Reactions   Hydrocodone Itching and Other (See Comments)    Hypersensitivity with whole pill    Current Outpatient Medications on File Prior to Visit  Medication Sig Dispense Refill   albuterol (VENTOLIN HFA) 108 (90 Base) MCG/ACT inhaler TAKE 2 PUFFS BY MOUTH EVERY 6 HOURS AS NEEDED FOR WHEEZE OR SHORTNESS OF BREATH 18 each 3   celecoxib (CELEBREX) 100 MG capsule TAKE 1 CAPSULE BY MOUTH TWICE A DAY 30 capsule 0   escitalopram (LEXAPRO) 5 MG tablet Take 1 tablet (5 mg total) by mouth daily. [place]  tablet 0   famotidine (PEPCID) 20 MG tablet TAKE 1 TABLET BY MOUTH TWICE A DAY 180 tablet 1   Melatonin 5 MG TABS Take by mouth at bedtime as needed (Sleep).     pantoprazole (PROTONIX) 40 MG tablet Take 1 tablet (40 mg total) by mouth daily. 90 tablet 1   sucralfate (CARAFATE) 1 g tablet Take 1 tablet (1 g total) by mouth 2 (two) times daily. 60 tablet 0   traZODone (DESYREL) 50 MG tablet TAKE 1/2-1 TABLET BY MOUTH AT BEDTIME AS NEEDED FOR SLEEP. 90 tablet 2   Vitamin D, Ergocalciferol, (DRISDOL) 1.25 MG (50000 UNIT) CAPS capsule Take 1 capsule (50,000 Units total) by mouth every 7 (seven) days. 12 capsule 3   No current facility-administered medications on file prior to visit.    BP (!) 144/86   Pulse 80   Temp 98.3 F (36.8 C)   Resp 18   Ht 6\' 1"  (1.854 m)   Wt 219 lb 12.8 oz (99.7 kg)   SpO2 98%   BMI 29.00 kg/m       Objective:   Physical Exam   General- No acute  distress. Pleasant patient. Neck- Full range of motion, no jvd Lungs- Clear, even and unlabored. Heart- regular rate and rhythm. Neurologic- CNII- XII grossly intact.   Rt hand- base of thum mild tender to palpation. Dip joint when flex and extends can hear slight click.        Assessment & Plan:   Patient Instructions  For rt thumb pain with trigger type signs/symptoms placed referal to hand specialist Dr. Erlinda Hong.  For possible combination of both allergic rhinitis and vasomotor rhinitis recommend continue otc steroid nasal spray and add on astelin spray.  Glad to hear gerd symptoms resolved. Controlled with both protonix 40 mg bid.  Your bp is borderline elevated at 140/80. Want you to get otc bp cuff or apple watch to check your blood pressure. Would ask you start checking. Also low salt diet and 5-10 weight loss can drop blood pressure.  Follow up mid to late summer for wellness exam or sooner if needed.   Mackie Pai, PA-C

## 2021-05-06 NOTE — Patient Instructions (Addendum)
For rt thumb pain with trigger type signs/symptoms placed referal to hand specialist Dr. Erlinda Hong.  For possible combination of both allergic rhinitis and vasomotor rhinitis recommend continue otc steroid nasal spray and add on astelin spray.  Glad to hear gerd symptoms resolved. Controlled with both protonix 40 mg bid.  Your bp is borderline elevated at 140/80. Want you to get otc bp cuff or apple watch to check your blood pressure. Would ask you start checking. Also low salt diet and 5-10 weight loss can drop blood pressure.  Follow up mid to late summer for wellness exam or sooner if needed.

## 2021-05-12 ENCOUNTER — Other Ambulatory Visit: Payer: Self-pay | Admitting: Medical

## 2021-05-15 ENCOUNTER — Encounter: Payer: Self-pay | Admitting: Medical

## 2021-05-16 ENCOUNTER — Telehealth: Payer: Self-pay | Admitting: Medical

## 2021-05-16 MED ORDER — PANTOPRAZOLE SODIUM 40 MG PO TBEC: 40.0000 mg | DELAYED_RELEASE_TABLET | Freq: Two times a day (BID) | ORAL | 1 refills | Status: DC

## 2021-05-16 NOTE — Telephone Encounter (Signed)
Rx protonix written as pt states gi md ok'd.

## 2021-06-09 ENCOUNTER — Other Ambulatory Visit: Payer: Self-pay | Admitting: Medical

## 2021-06-21 ENCOUNTER — Other Ambulatory Visit: Payer: Self-pay | Admitting: Urology

## 2021-06-22 ENCOUNTER — Telehealth: Payer: Self-pay | Admitting: Gastroenterology

## 2021-06-22 NOTE — Telephone Encounter (Signed)
New directs have been sent to patient through My Chart. I have contacted him that I have sent him and he did state that he just got the message.

## 2021-06-22 NOTE — Telephone Encounter (Signed)
Patient called stating that he needs new prep instructions. Seeking advice, please advise.

## 2021-06-24 ENCOUNTER — Ambulatory Visit (AMBULATORY_SURGERY_CENTER): Payer: BC Managed Care – PPO | Admitting: Gastroenterology

## 2021-06-24 ENCOUNTER — Encounter: Payer: Self-pay | Admitting: Gastroenterology

## 2021-06-24 VITALS — BP 129/94 | HR 74 | Temp 98.1°F | Resp 10 | Ht 73.0 in | Wt 216.0 lb

## 2021-06-24 DIAGNOSIS — Z1211 Encounter for screening for malignant neoplasm of colon: Secondary | ICD-10-CM | POA: Diagnosis present

## 2021-06-24 DIAGNOSIS — D125 Benign neoplasm of sigmoid colon: Secondary | ICD-10-CM | POA: Diagnosis not present

## 2021-06-24 MED ORDER — SODIUM CHLORIDE 0.9 % IV SOLN: 500.0000 mL | Freq: Once | INTRAVENOUS | Status: DC

## 2021-06-24 NOTE — Op Note (Signed)
Kyle Miller Procedure Date: 06/24/2021 1:25 PM MRN: [id number] Endoscopist: Nicki Reaper E. Kyle Miller , MD Age: 46 Referring MD:  Date of Birth: 06/05/1976 Gender: Male Account #: [id number] Procedure:                Colonoscopy Indications:              Screening for colorectal malignant neoplasm, This                            is the patient's first colonoscopy Medicines:                Monitored Anesthesia Care Procedure:                Pre-Anesthesia Assessment:                           - Prior to the procedure, a History and Physical                            was performed, and patient medications and                            allergies were reviewed. The patient's tolerance of                            previous anesthesia was also reviewed. The risks                            and benefits of the procedure and the sedation                            options and risks were discussed with the patient.                            All questions were answered, and informed consent                            was obtained. Prior Anticoagulants: The patient has                            taken no previous anticoagulant or antiplatelet                            agents. ASA Grade Assessment: II - A patient with                            mild systemic disease. After reviewing the risks                            and benefits, the patient was deemed in                            satisfactory condition to undergo the procedure.  After obtaining informed consent, the colonoscope                            was passed under direct vision. Throughout the                            procedure, the patient's blood pressure, pulse, and                            oxygen saturations were monitored continuously. The                            Olympus CF-HQ190L (Serial# [id number]) Colonoscope was                            introduced through the  anus and advanced to the the                            terminal ileum, with identification of the                            appendiceal orifice and IC valve. The colonoscopy                            was performed without difficulty. The patient                            tolerated the procedure well. The quality of the                            bowel preparation was adequate. The terminal ileum,                            ileocecal valve, appendiceal orifice, and rectum                            were photographed. The bowel preparation used was                            Sutab via split dose instruction. Scope In: 1:30:48 PM Scope Out: 1:45:37 PM Scope Withdrawal Time: 0 hours 11 minutes 35 seconds  Total Procedure Duration: 0 hours 14 minutes 49 seconds  Findings:                 The perianal and digital rectal examinations were                            normal. Pertinent negatives include normal                            sphincter tone and no palpable rectal lesions.                           A 5 mm polyp was found in the sigmoid colon. The  polyp was semi-pedunculated. The polyp was removed                            with a cold snare. Resection and retrieval were                            complete. Estimated blood loss was minimal.                           The exam was otherwise normal throughout the                            examined colon.                           The terminal ileum appeared normal.                           The retroflexed view of the distal rectum and anal                            verge was normal and showed no anal or rectal                            abnormalities. Complications:            No immediate complications. Estimated Blood Loss:     Estimated blood loss was minimal. Impression:               - One 5 mm polyp in the sigmoid colon, removed with                            a cold snare. Resected and retrieved.                            - The examined portion of the ileum was normal.                           - The distal rectum and anal verge are normal on                            retroflexion view. Recommendation:           - Patient has a contact number available for                            emergencies. The signs and symptoms of potential                            delayed complications were discussed with the                            patient. Return to normal activities tomorrow.  Written discharge instructions were provided to the                            patient.                           - Resume previous diet.                           - Continue present medications.                           - Await pathology results.                           - Repeat colonoscopy (date not yet determined) for                            surveillance based on pathology results. Kyle Miller E. Kyle Schatz, MD 06/24/2021 1:49:30 PM This report has been signed electronically.

## 2021-06-24 NOTE — Patient Instructions (Signed)
Handout provided on polyps.   YOU HAD AN ENDOSCOPIC PROCEDURE TODAY AT THE [facility]:   Refer to the procedure report that was given to you for any specific questions about what was found during the examination.  If the procedure report does not answer your questions, please call your gastroenterologist to clarify.  If you requested that your care partner not be given the details of your procedure findings, then the procedure report has been included in a sealed envelope for you to review at your convenience later.  YOU SHOULD EXPECT: Some feelings of bloating in the abdomen. Passage of more gas than usual.  Walking can help get rid of the air that was put into your GI tract during the procedure and reduce the bloating. If you had a lower endoscopy (such as a colonoscopy or flexible sigmoidoscopy) you may notice spotting of blood in your stool or on the toilet paper. If you underwent a bowel prep for your procedure, you may not have a normal bowel movement for a few days.  Please Note:  You might notice some irritation and congestion in your nose or some drainage.  This is from the oxygen used during your procedure.  There is no need for concern and it should clear up in a day or so.  SYMPTOMS TO REPORT IMMEDIATELY:  Following lower endoscopy (colonoscopy or flexible sigmoidoscopy):  Excessive amounts of blood in the stool  Significant tenderness or worsening of abdominal pains  Swelling of the abdomen that is new, acute  Fever of 100F or higher  For urgent or emergent issues, a gastroenterologist can be reached at any hour by calling [phone]. Do not use MyChart messaging for urgent concerns.    DIET:  We do recommend a small meal at first, but then you may proceed to your regular diet.  Drink plenty of fluids but you should avoid alcoholic beverages for 24 hours.  ACTIVITY:  You should plan to take it easy for the rest of today and you should NOT DRIVE or use heavy  machinery until tomorrow (because of the sedation medicines used during the test).    FOLLOW UP: Our staff will call the number listed on your records 48-72 hours following your procedure to check on you and address any questions or concerns that you may have regarding the information given to you following your procedure. If we do not reach you, we will leave a message.  We will attempt to reach you two times.  During this call, we will ask if you have developed any symptoms of COVID 19. If you develop any symptoms (ie: fever, flu-like symptoms, shortness of breath, cough etc.) before then, please call [phone].  If you test positive for Covid 19 in the 2 weeks post procedure, please call and report this information to us.    If any biopsies were taken you will be contacted by phone or by letter within the next 1-3 weeks.  Please call us at [phone] if you have not heard about the biopsies in 3 weeks.    SIGNATURES/CONFIDENTIALITY: You and/or your care partner have signed paperwork which will be entered into your electronic medical record.  These signatures attest to the fact that that the information above on your After Visit Summary has been reviewed and is understood.  Full responsibility of the confidentiality of this discharge information lies with you and/or your care-partner.  

## 2021-06-24 NOTE — Progress Notes (Signed)
[facility] History and Physical   Primary Care Physician:  Mackie Pai, PA-C   Reason for Procedure:   Colon cancer screening  Plan:    Screening colonoscopy     HPI: Kyle Miller is a 46 y.o. male undergoing initial average risk screening colonoscopy.  He has no family history of colon cancer and no chronic GI symptoms.    Past Medical History:  Diagnosis Date   Asthma    Bulging lumbar disc    Depression    GERD (gastroesophageal reflux disease)    History of chicken pox     Past Surgical History:  Procedure Laterality Date   WISDOM TOOTH EXTRACTION      Prior to Admission medications   Medication Sig Start Date End Date Taking? Authorizing Provider  azelastine (ASTELIN) 0.1 % nasal spray Place 2 sprays into both nostrils 2 (two) times daily. Use in each nostril as directed 05/06/21  Yes Saguier, Percell Miller, PA-C  pantoprazole (PROTONIX) 40 MG tablet Take 1 tablet (40 mg total) by mouth 2 (two) times daily. 05/16/21  Yes Saguier, Percell Miller, PA-C  traZODone (DESYREL) 50 MG tablet TAKE 1/2-1 TABLET BY MOUTH AT BEDTIME AS NEEDED FOR SLEEP. 12/04/20  Yes Saguier, Percell Miller, PA-C  albuterol (VENTOLIN HFA) 108 (90 Base) MCG/ACT inhaler TAKE 2 PUFFS BY MOUTH EVERY 6 HOURS AS NEEDED FOR WHEEZE OR SHORTNESS OF BREATH 11/10/20   Saguier, Percell Miller, PA-C  celecoxib (CELEBREX) 100 MG capsule TAKE 1 CAPSULE BY MOUTH TWICE A DAY 04/25/21   Saguier, Percell Miller, PA-C  famotidine (PEPCID) 20 MG tablet TAKE 1 TABLET BY MOUTH TWICE A DAY 02/22/21   Saguier, Percell Miller, PA-C  Melatonin 5 MG TABS Take by mouth at bedtime as needed (Sleep).    [provider]  sucralfate (CARAFATE) 1 g tablet TAKE 1 TABLET BY MOUTH TWICE A DAY 06/09/21   Saguier, Percell Miller, PA-C  Vitamin D, Ergocalciferol, (DRISDOL) 1.25 MG (50000 UNIT) CAPS capsule Take 1 capsule (50,000 Units total) by mouth every 7 (seven) days. 10/05/20   Saguier, Percell Miller, PA-C    Current Outpatient Medications  Medication Sig Dispense Refill    azelastine (ASTELIN) 0.1 % nasal spray Place 2 sprays into both nostrils 2 (two) times daily. Use in each nostril as directed 30 mL 3   pantoprazole (PROTONIX) 40 MG tablet Take 1 tablet (40 mg total) by mouth 2 (two) times daily. 180 tablet 1   traZODone (DESYREL) 50 MG tablet TAKE 1/2-1 TABLET BY MOUTH AT BEDTIME AS NEEDED FOR SLEEP. 90 tablet 2   albuterol (VENTOLIN HFA) 108 (90 Base) MCG/ACT inhaler TAKE 2 PUFFS BY MOUTH EVERY 6 HOURS AS NEEDED FOR WHEEZE OR SHORTNESS OF BREATH 18 each 3   celecoxib (CELEBREX) 100 MG capsule TAKE 1 CAPSULE BY MOUTH TWICE A DAY 30 capsule 0   famotidine (PEPCID) 20 MG tablet TAKE 1 TABLET BY MOUTH TWICE A DAY 180 tablet 1   Melatonin 5 MG TABS Take by mouth at bedtime as needed (Sleep).     sucralfate (CARAFATE) 1 g tablet TAKE 1 TABLET BY MOUTH TWICE A DAY 60 tablet 0   Vitamin D, Ergocalciferol, (DRISDOL) 1.25 MG (50000 UNIT) CAPS capsule Take 1 capsule (50,000 Units total) by mouth every 7 (seven) days. 12 capsule 3   Current Facility-Administered Medications  Medication Dose Route Frequency Provider Last Rate Last Admin   0.9 %  sodium chloride infusion  500 mL Intravenous Once Daryel November, MD        Allergies as  of 06/24/2021 - Review Complete 06/24/2021  Allergen Reaction Noted   Hydrocodone Itching and Other (See Comments) 12/22/2013    Family History  Problem Relation Age of Onset   Healthy Mother        Living   Healthy Father        Living   Thyroid disease Brother    Asthma Daughter    Colon polyps Neg Hx    Colon cancer Neg Hx     Social History   Socioeconomic History   Marital status: Married    Spouse name: Not on file   Number of children: Not on file   Years of education: Not on file   Highest education level: Not on file  Occupational History   Not on file  Tobacco Use   Smoking status: Never   Smokeless tobacco: Never  Vaping Use   Vaping Use: Never used  Substance and Sexual Activity   Alcohol use: No     Alcohol/week: 0.0 standard drinks   Drug use: No   Sexual activity: Yes    Partners: Female  Other Topics Concern   Not on file  Social History Narrative   He live with wife and youngest daughter.   He works as a Civil Service fast streamer.   Highest level of education:  Secretary/administrator, BS   Social Determinants of Health   Financial Resource Strain: Not on file  Food Insecurity: Not on file  Transportation Needs: Not on file  Physical Activity: Not on file  Stress: Not on file  Social Connections: Not on file  Intimate Partner Violence: Not on file    Review of Systems:  All other review of systems negative except as mentioned in the HPI.  Physical Exam: Vital signs BP (!) 175/114    Pulse 85    Temp 98.1 F (36.7 C)    Ht 6\' 1"  (1.854 m)    Wt 216 lb (98 kg)    SpO2 97%    BMI 28.50 kg/m   General:   Alert,  Well-developed, well-nourished, pleasant and cooperative in NAD Airway:  Mallampati 1 Lungs:  Clear throughout to auscultation.   Heart:  Regular rate and rhythm; no murmurs, clicks, rubs,  or gallops. Abdomen:  Soft, nontender and nondistended. Normal bowel sounds.   Neuro/Psych:  Normal mood and affect. A and O x 3   Keniah Klemmer E. Candis Schatz, MD [facility]

## 2021-06-24 NOTE — Progress Notes (Signed)
VSS, transported to PACU °

## 2021-06-24 NOTE — Progress Notes (Signed)
Called to room to assist during endoscopic procedure.  Patient ID and intended procedure confirmed with present staff. Received instructions for my participation in the procedure from the performing physician.  

## 2021-06-27 ENCOUNTER — Other Ambulatory Visit: Payer: Self-pay | Admitting: Urology

## 2021-06-28 ENCOUNTER — Telehealth: Payer: Self-pay

## 2021-06-28 NOTE — Telephone Encounter (Signed)
°  Follow up Call-  Call back number 06/24/2021  Post procedure Call Back phone  # [phone]  Permission to leave phone message Yes  Some recent data might be hidden     Patient questions:  Do you have a fever, pain , or abdominal swelling? No. Pain Score  0 *  Have you tolerated food without any problems? Yes.    Have you been able to return to your normal activities? Yes.    Do you have any questions about your discharge instructions: Diet   No. Medications  No. Follow up visit  No.  Do you have questions or concerns about your Care? No.  Actions: * If pain score is 4 or above: No action needed, pain <4.  Have you developed a fever since your procedure? no  2.   Have you had an respiratory symptoms (SOB or cough) since your procedure? no  3.   Have you tested positive for COVID 19 since your procedure no  4.   Have you had any family members/close contacts diagnosed with the COVID 19 since your procedure?  no   If yes to any of these questions please route to [name], RN and [name], RN

## 2021-06-28 NOTE — Telephone Encounter (Signed)
°  Follow up Call-  Call back number 06/24/2021  Post procedure Call Back phone  # [phone]  Permission to leave phone message Yes  Some recent data might be hidden     Left message

## 2021-06-29 NOTE — Progress Notes (Signed)
Kyle Miller,  The polyp which I removed during your recent procedure was proven to be completely benign but is considered a "pre-cancerous" polyp that MAY have grown into cancer if it had not been removed.  Studies shows that at least 20% of women over age 48 and 30% of men over age 28 have pre-cancerous polyps.  Based on current nationally recognized surveillance guidelines, I recommend that you have a repeat colonoscopy in 7 years.   If you develop any new rectal bleeding, abdominal pain or significant bowel habit changes, please contact me before then.

## 2021-07-04 ENCOUNTER — Other Ambulatory Visit: Payer: Self-pay | Admitting: Urology

## 2021-07-04 ENCOUNTER — Other Ambulatory Visit: Payer: Self-pay

## 2021-07-04 ENCOUNTER — Encounter (HOSPITAL_BASED_OUTPATIENT_CLINIC_OR_DEPARTMENT_OTHER): Payer: Self-pay | Admitting: Urology

## 2021-07-04 NOTE — Progress Notes (Signed)
Pre-procedure call completed. Ride is secured.  Patient will stop celebrex.  Instructions given and patient states understanding.

## 2021-07-06 NOTE — H&P (Signed)
H&P  Chief Complaint: Kidney stone  History of Present Illness: 46 yo male presents for ESL for mgmt of a 5 mm Lt upper pole renal calculus.   Past Medical History:  Diagnosis Date   Asthma    Bulging lumbar disc    Depression    GERD (gastroesophageal reflux disease)    History of chicken pox     Past Surgical History:  Procedure Laterality Date   WISDOM TOOTH EXTRACTION      Home Medications:  Allergies as of 07/06/2021       Reactions   Hydrocodone Itching, Other (See Comments)   Hypersensitivity with whole pill        Medication List      Notice   Cannot display discharge medications because the patient has not yet been admitted.     Allergies:  Allergies  Allergen Reactions   Hydrocodone Itching and Other (See Comments)    Hypersensitivity with whole pill    Family History  Problem Relation Age of Onset   Healthy Mother        Living   Healthy Father        Living   Thyroid disease Brother    Asthma Daughter    Colon polyps Neg Hx    Colon cancer Neg Hx     Social History:  reports that he has never smoked. He has never used smokeless tobacco. He reports that he does not drink alcohol and does not use drugs.  ROS: A complete review of systems was performed.  All systems are negative except for pertinent findings as noted.  Physical Exam:  Vital signs in last 24 hours: There were no vitals taken for this visit. Constitutional:  Alert and oriented, No acute distress Cardiovascular: Regular rate  Respiratory: Normal respiratory effort Neurologic: Grossly intact, no focal deficits Psychiatric: Normal mood and affect  I have reviewed prior pt notes  I have reviewed notes from referring/previous physicians  I have reviewed urinalysis results  I have independently reviewed prior imaging    Impression/Assessment:  Lt upper pole stone  Plan:  ESL

## 2021-07-07 ENCOUNTER — Other Ambulatory Visit: Payer: Self-pay

## 2021-07-07 ENCOUNTER — Encounter (HOSPITAL_BASED_OUTPATIENT_CLINIC_OR_DEPARTMENT_OTHER): Payer: Self-pay | Admitting: Urology

## 2021-07-07 ENCOUNTER — Ambulatory Visit (HOSPITAL_BASED_OUTPATIENT_CLINIC_OR_DEPARTMENT_OTHER)
Admission: RE | Admit: 2021-07-07 | Discharge: 2021-07-07 | Disposition: A | Discharge status: Home / Self Care | Payer: BC Managed Care – PPO | Attending: Urology | Admitting: Urology

## 2021-07-07 ENCOUNTER — Encounter (HOSPITAL_BASED_OUTPATIENT_CLINIC_OR_DEPARTMENT_OTHER)
Admission: RE | Disposition: A | Discharge status: Home / Self Care | Payer: Self-pay | Source: Home / Self Care | Attending: Urology

## 2021-07-07 ENCOUNTER — Ambulatory Visit (HOSPITAL_COMMUNITY): Payer: BC Managed Care – PPO

## 2021-07-07 DIAGNOSIS — N200 Calculus of kidney: Secondary | ICD-10-CM | POA: Diagnosis not present

## 2021-07-07 HISTORY — PX: EXTRACORPOREAL SHOCK WAVE LITHOTRIPSY: SHX1557

## 2021-07-07 SURGERY — LITHOTRIPSY, ESWL
Anesthesia: LOCAL | Laterality: Left

## 2021-07-07 MED ORDER — TAMSULOSIN HCL 0.4 MG PO CAPS: 0.4000 mg | ORAL_CAPSULE | Freq: Every day | ORAL | 0 refills | Status: DC

## 2021-07-07 MED ORDER — DIAZEPAM 5 MG PO TABS
ORAL_TABLET | ORAL | Status: AC
  Filled 2021-07-07: qty 2

## 2021-07-07 MED ORDER — CIPROFLOXACIN HCL 500 MG PO TABS
500.0000 mg | ORAL_TABLET | Freq: Once | ORAL | Status: AC
  Administered 2021-07-07: 500 mg via ORAL

## 2021-07-07 MED ORDER — CEFAZOLIN SODIUM-DEXTROSE 2-4 GM/100ML-% IV SOLN: 2.0000 g | Freq: Once | INTRAVENOUS | Status: DC

## 2021-07-07 MED ORDER — DIAZEPAM 5 MG PO TABS
10.0000 mg | ORAL_TABLET | ORAL | Status: AC
  Administered 2021-07-07: 10 mg via ORAL

## 2021-07-07 MED ORDER — DIPHENHYDRAMINE HCL 25 MG PO CAPS
ORAL_CAPSULE | ORAL | Status: AC
  Filled 2021-07-07: qty 1

## 2021-07-07 MED ORDER — OXYCODONE HCL 5 MG PO TABS
5.0000 mg | ORAL_TABLET | Freq: Three times a day (TID) | ORAL | 0 refills | Status: DC | PRN
Start: 2021-07-07 — End: 2022-02-09

## 2021-07-07 MED ORDER — SODIUM CHLORIDE 0.9 % IV SOLN: INTRAVENOUS | Status: DC

## 2021-07-07 MED ORDER — KETOROLAC TROMETHAMINE 30 MG/ML IJ SOLN
INTRAMUSCULAR | Status: AC
  Filled 2021-07-07: qty 1

## 2021-07-07 MED ORDER — KETOROLAC TROMETHAMINE 60 MG/2ML IM SOLN
30.0000 mg | Freq: Once | INTRAMUSCULAR | Status: AC
Start: 2021-07-07 — End: 2021-07-12

## 2021-07-07 MED ORDER — DIPHENHYDRAMINE HCL 25 MG PO CAPS
25.0000 mg | ORAL_CAPSULE | ORAL | Status: AC
  Administered 2021-07-07: 25 mg via ORAL

## 2021-07-07 MED ORDER — KETOROLAC TROMETHAMINE 30 MG/ML IJ SOLN
30.0000 mg | Freq: Once | INTRAMUSCULAR | Status: AC
  Administered 2021-07-07: 30 mg via INTRAVENOUS

## 2021-07-07 MED ORDER — CIPROFLOXACIN HCL 500 MG PO TABS
ORAL_TABLET | ORAL | Status: AC
  Filled 2021-07-07: qty 1

## 2021-07-07 NOTE — Interval H&P Note (Signed)
History and Physical Interval Note:  07/07/2021 7:45 AM  Kyle Miller  has presented today for surgery, with the diagnosis of LEFT RENAL STONE.  The various methods of treatment have been discussed with the patient and family. After consideration of risks, benefits and other options for treatment, the patient has consented to  Procedure(s): EXTRACORPOREAL SHOCK WAVE LITHOTRIPSY (ESWL) (Left) as a surgical intervention.  The patient's history has been reviewed, patient examined, no change in status, stable for surgery.  I have reviewed the patient's chart and labs.  Questions were answered to the patient's satisfaction.     Lillette Boxer Doreatha Offer

## 2021-07-07 NOTE — Op Note (Signed)
See [facility] OP note scanned into chart. 

## 2021-07-07 NOTE — Discharge Instructions (Addendum)
See [facility] discharge instructions in chart.    No ibuprofen, motrin, advil, or celebrex until after 2:30 pm

## 2021-07-08 ENCOUNTER — Encounter (HOSPITAL_BASED_OUTPATIENT_CLINIC_OR_DEPARTMENT_OTHER): Payer: Self-pay | Admitting: Urology

## 2021-07-12 ENCOUNTER — Other Ambulatory Visit: Payer: Self-pay | Admitting: Urology

## 2021-07-12 ENCOUNTER — Other Ambulatory Visit: Payer: Self-pay

## 2021-07-12 ENCOUNTER — Encounter (HOSPITAL_BASED_OUTPATIENT_CLINIC_OR_DEPARTMENT_OTHER): Payer: Self-pay | Admitting: Urology

## 2021-07-12 NOTE — Progress Notes (Signed)
Spoke w/ via phone for pre-op interview--- Kyle Miller needs dos----     [employer] results------ COVID test -----patient states asymptomatic no test needed Arrive at -------0930 NPO after MN NO Solid Food.  Clear liquids from MN until---0830 Med rec completed Medications to take morning of surgery ----- Bring Albuterol, Astelin, Protonix, Oxycodone PRN Diabetic medication ----- Patient instructed no nail polish to be worn day of surgery Patient instructed to bring photo id and insurance card day of surgery Patient aware to have Driver (ride ) / caregiver   Wife - Kyle Miller  for 24 hours after surgery  Patient Special Instructions ----- Pre-Op special Istructions ----- Patient verbalized understanding of instructions that were given at this phone interview. Patient denies shortness of breath, chest pain, fever, cough at this phone interview.

## 2021-07-13 ENCOUNTER — Emergency Department (HOSPITAL_COMMUNITY): Payer: BC Managed Care – PPO

## 2021-07-13 ENCOUNTER — Encounter (HOSPITAL_COMMUNITY): Payer: Self-pay | Admitting: Emergency Medicine

## 2021-07-13 ENCOUNTER — Emergency Department (HOSPITAL_COMMUNITY): Payer: BC Managed Care – PPO | Admitting: Anesthesiology

## 2021-07-13 ENCOUNTER — Observation Stay (HOSPITAL_COMMUNITY): Payer: BC Managed Care – PPO

## 2021-07-13 ENCOUNTER — Encounter (HOSPITAL_COMMUNITY)
Admission: EM | Disposition: A | Discharge status: Home / Self Care | Payer: Self-pay | Source: Home / Self Care | Attending: Emergency Medicine

## 2021-07-13 ENCOUNTER — Observation Stay (HOSPITAL_COMMUNITY)
Admission: EM | Admit: 2021-07-13 | Discharge: 2021-07-15 | Disposition: A | Discharge status: Home / Self Care | Payer: BC Managed Care – PPO | Attending: Urology | Admitting: Urology

## 2021-07-13 DIAGNOSIS — K219 Gastro-esophageal reflux disease without esophagitis: Secondary | ICD-10-CM | POA: Diagnosis present

## 2021-07-13 DIAGNOSIS — J45909 Unspecified asthma, uncomplicated: Secondary | ICD-10-CM | POA: Diagnosis not present

## 2021-07-13 DIAGNOSIS — N200 Calculus of kidney: Secondary | ICD-10-CM | POA: Diagnosis present

## 2021-07-13 DIAGNOSIS — Z20822 Contact with and (suspected) exposure to covid-19: Secondary | ICD-10-CM | POA: Insufficient documentation

## 2021-07-13 DIAGNOSIS — N390 Urinary tract infection, site not specified: Secondary | ICD-10-CM | POA: Diagnosis present

## 2021-07-13 DIAGNOSIS — N179 Acute kidney failure, unspecified: Secondary | ICD-10-CM | POA: Diagnosis present

## 2021-07-13 DIAGNOSIS — Z7951 Long term (current) use of inhaled steroids: Secondary | ICD-10-CM | POA: Insufficient documentation

## 2021-07-13 DIAGNOSIS — N10 Acute tubulo-interstitial nephritis: Secondary | ICD-10-CM | POA: Insufficient documentation

## 2021-07-13 DIAGNOSIS — N202 Calculus of kidney with calculus of ureter: Principal | ICD-10-CM | POA: Insufficient documentation

## 2021-07-13 DIAGNOSIS — A419 Sepsis, unspecified organism: Secondary | ICD-10-CM | POA: Diagnosis not present

## 2021-07-13 DIAGNOSIS — R6520 Severe sepsis without septic shock: Secondary | ICD-10-CM | POA: Insufficient documentation

## 2021-07-13 HISTORY — PX: CYSTOSCOPY WITH RETROGRADE PYELOGRAM, URETEROSCOPY AND STENT PLACEMENT: SHX5789

## 2021-07-13 HISTORY — DX: Sepsis, unspecified organism: A41.9

## 2021-07-13 LAB — URINALYSIS, ROUTINE W REFLEX MICROSCOPIC
Bacteria, UA: NONE SEEN
Bilirubin Urine: NEGATIVE
Glucose, UA: NEGATIVE mg/dL
Ketones, ur: 80 mg/dL — AB
Leukocytes,Ua: NEGATIVE
Nitrite: NEGATIVE
Protein, ur: NEGATIVE mg/dL
Specific Gravity, Urine: 1.006 (ref 1.005–1.030)
pH: 5 (ref 5.0–8.0)

## 2021-07-13 LAB — CBC WITH DIFFERENTIAL/PLATELET
Abs Immature Granulocytes: 0.09 10*3/uL — ABNORMAL HIGH (ref 0.00–0.07)
Basophils Absolute: 0.1 10*3/uL (ref 0.0–0.1)
Basophils Relative: 0 %
Eosinophils Absolute: 0.1 10*3/uL (ref 0.0–0.5)
Eosinophils Relative: 0 %
HCT: 40.6 % (ref 39.0–52.0)
Hemoglobin: 13.2 g/dL (ref 13.0–17.0)
Immature Granulocytes: 1 %
Lymphocytes Relative: 7 %
Lymphs Abs: 1.2 10*3/uL (ref 0.7–4.0)
MCH: 29.6 pg (ref 26.0–34.0)
MCHC: 32.5 g/dL (ref 30.0–36.0)
MCV: 91 fL (ref 80.0–100.0)
Monocytes Absolute: 1.5 10*3/uL — ABNORMAL HIGH (ref 0.1–1.0)
Monocytes Relative: 9 %
Neutro Abs: 13.5 10*3/uL — ABNORMAL HIGH (ref 1.7–7.7)
Neutrophils Relative %: 83 %
Platelets: 276 10*3/uL (ref 150–400)
RBC: 4.46 MIL/uL (ref 4.22–5.81)
RDW: 12.7 % (ref 11.5–15.5)
WBC: 16.4 10*3/uL — ABNORMAL HIGH (ref 4.0–10.5)
nRBC: 0 % (ref 0.0–0.2)

## 2021-07-13 LAB — COMPREHENSIVE METABOLIC PANEL
ALT: 19 U/L (ref 0–44)
AST: 17 U/L (ref 15–41)
Albumin: 4 g/dL (ref 3.5–5.0)
Alkaline Phosphatase: 74 U/L (ref 38–126)
Anion gap: 8 (ref 5–15)
BUN: 13 mg/dL (ref 6–20)
CO2: 26 mmol/L (ref 22–32)
Calcium: 8.9 mg/dL (ref 8.9–10.3)
Chloride: 101 mmol/L (ref 98–111)
Creatinine, Ser: 1.66 mg/dL — ABNORMAL HIGH (ref 0.61–1.24)
GFR, Estimated: 51 mL/min — ABNORMAL LOW (ref >60)
Glucose, Bld: 86 mg/dL (ref 70–99)
Potassium: 3.7 mmol/L (ref 3.5–5.1)
Sodium: 135 mmol/L (ref 135–145)
Total Bilirubin: 1 mg/dL (ref 0.3–1.2)
Total Protein: 7.4 g/dL (ref 6.5–8.1)

## 2021-07-13 LAB — RESP PANEL BY RT-PCR (FLU A&B, COVID) ARPGX2
Influenza A by PCR: NEGATIVE
Influenza B by PCR: NEGATIVE
SARS Coronavirus 2 by RT PCR: NEGATIVE

## 2021-07-13 LAB — LACTIC ACID, PLASMA
Lactic Acid, Venous: 0.8 mmol/L (ref 0.5–1.9)
Lactic Acid, Venous: 0.7 mmol/L (ref 0.5–1.9)

## 2021-07-13 LAB — PROTIME-INR
INR: 1.1 (ref 0.8–1.2)
Prothrombin Time: 13.9 seconds (ref 11.4–15.2)

## 2021-07-13 LAB — APTT: aPTT: 32 seconds (ref 24–36)

## 2021-07-13 SURGERY — CYSTOURETEROSCOPY, WITH RETROGRADE PYELOGRAM AND STENT INSERTION
Anesthesia: General | Site: Urethra | Laterality: Left

## 2021-07-13 MED ORDER — CHLORHEXIDINE GLUCONATE 0.12 % MT SOLN
15.0000 mL | OROMUCOSAL | Status: AC
  Administered 2021-07-13: 15 mL via OROMUCOSAL

## 2021-07-13 MED ORDER — ALBUTEROL SULFATE (2.5 MG/3ML) 0.083% IN NEBU: 3.0000 mL | INHALATION_SOLUTION | RESPIRATORY_TRACT | Status: DC | PRN

## 2021-07-13 MED ORDER — PANTOPRAZOLE SODIUM 40 MG PO TBEC
40.0000 mg | DELAYED_RELEASE_TABLET | Freq: Every day | ORAL | Status: DC
  Administered 2021-07-14: 40 mg via ORAL
  Filled 2021-07-13 (×2): qty 1

## 2021-07-13 MED ORDER — LIDOCAINE 2% (20 MG/ML) 5 ML SYRINGE
INTRAMUSCULAR | Status: DC | PRN
  Administered 2021-07-13: 80 mg via INTRAVENOUS

## 2021-07-13 MED ORDER — SODIUM CHLORIDE (PF) 0.9 % IJ SOLN
INTRAMUSCULAR | Status: DC | PRN
  Administered 2021-07-13: 10 mL

## 2021-07-13 MED ORDER — SODIUM CHLORIDE 0.9 % IR SOLN
Status: DC | PRN
  Administered 2021-07-13: 3000 mL

## 2021-07-13 MED ORDER — ACETAMINOPHEN 325 MG PO TABS: 650.0000 mg | ORAL_TABLET | Freq: Four times a day (QID) | ORAL | Status: DC | PRN

## 2021-07-13 MED ORDER — ACETAMINOPHEN 500 MG PO TABS
1000.0000 mg | ORAL_TABLET | Freq: Once | ORAL | Status: AC
Start: 2021-07-13 — End: 2021-07-13
  Administered 2021-07-13: 1000 mg via ORAL
  Filled 2021-07-13: qty 2

## 2021-07-13 MED ORDER — GUAIFENESIN-DM 100-10 MG/5ML PO SYRP: 10.0000 mL | ORAL_SOLUTION | ORAL | Status: DC | PRN

## 2021-07-13 MED ORDER — CEFTRIAXONE SODIUM 2 G IJ SOLR
2.0000 g | Freq: Once | INTRAMUSCULAR | Status: AC
  Administered 2021-07-13: 2 g via INTRAVENOUS
  Filled 2021-07-13: qty 20

## 2021-07-13 MED ORDER — FENTANYL CITRATE (PF) 100 MCG/2ML IJ SOLN
INTRAMUSCULAR | Status: AC
  Filled 2021-07-13: qty 2

## 2021-07-13 MED ORDER — ALBUTEROL SULFATE HFA 108 (90 BASE) MCG/ACT IN AERS: 2.0000 | INHALATION_SPRAY | Freq: Four times a day (QID) | RESPIRATORY_TRACT | Status: DC

## 2021-07-13 MED ORDER — OXYCODONE-ACETAMINOPHEN 5-325 MG PO TABS
1.0000 | ORAL_TABLET | Freq: Once | ORAL | Status: DC
  Filled 2021-07-13: qty 1

## 2021-07-13 MED ORDER — LACTATED RINGERS IV SOLN: INTRAVENOUS | Status: DC

## 2021-07-13 MED ORDER — LIDOCAINE HCL (PF) 2 % IJ SOLN
INTRAMUSCULAR | Status: AC
  Filled 2021-07-13: qty 5

## 2021-07-13 MED ORDER — LACTATED RINGERS IV BOLUS
1000.0000 mL | Freq: Once | INTRAVENOUS | Status: AC
  Administered 2021-07-13: 1000 mL via INTRAVENOUS

## 2021-07-13 MED ORDER — TRAZODONE HCL 50 MG PO TABS
50.0000 mg | ORAL_TABLET | Freq: Every evening | ORAL | Status: DC | PRN
  Administered 2021-07-13 – 2021-07-14 (×2): 50 mg via ORAL
  Filled 2021-07-13 (×2): qty 1

## 2021-07-13 MED ORDER — LACTATED RINGERS IV BOLUS (SEPSIS)
2000.0000 mL | Freq: Once | INTRAVENOUS | Status: DC
Start: 2021-07-13 — End: 2021-07-13

## 2021-07-13 MED ORDER — PROPOFOL 10 MG/ML IV BOLUS
INTRAVENOUS | Status: AC
  Filled 2021-07-13: qty 20

## 2021-07-13 MED ORDER — MIDAZOLAM HCL 2 MG/2ML IJ SOLN
INTRAMUSCULAR | Status: AC
  Filled 2021-07-13: qty 2

## 2021-07-13 MED ORDER — PROPOFOL 10 MG/ML IV BOLUS
INTRAVENOUS | Status: DC | PRN
  Administered 2021-07-13: 200 mg via INTRAVENOUS

## 2021-07-13 MED ORDER — ONDANSETRON HCL 4 MG/2ML IJ SOLN: 4.0000 mg | Freq: Four times a day (QID) | INTRAMUSCULAR | Status: DC | PRN

## 2021-07-13 MED ORDER — SODIUM CHLORIDE 0.9 % IV SOLN
200.0000 mg | Freq: Once | INTRAVENOUS | Status: DC
  Filled 2021-07-13: qty 40

## 2021-07-13 MED ORDER — ONDANSETRON HCL 4 MG PO TABS: 4.0000 mg | ORAL_TABLET | Freq: Four times a day (QID) | ORAL | Status: DC | PRN

## 2021-07-13 MED ORDER — ONDANSETRON HCL 4 MG/2ML IJ SOLN
4.0000 mg | Freq: Once | INTRAMUSCULAR | Status: AC
  Administered 2021-07-13: 4 mg via INTRAVENOUS
  Filled 2021-07-13: qty 2

## 2021-07-13 MED ORDER — DEXAMETHASONE SODIUM PHOSPHATE 10 MG/ML IJ SOLN
INTRAMUSCULAR | Status: AC
  Filled 2021-07-13: qty 1

## 2021-07-13 MED ORDER — ONDANSETRON HCL 4 MG/2ML IJ SOLN
INTRAMUSCULAR | Status: DC | PRN
  Administered 2021-07-13: 4 mg via INTRAVENOUS

## 2021-07-13 MED ORDER — ONDANSETRON HCL 4 MG/2ML IJ SOLN
INTRAMUSCULAR | Status: AC
  Filled 2021-07-13: qty 2

## 2021-07-13 MED ORDER — SODIUM CHLORIDE 0.9 % IV SOLN
2.0000 g | INTRAVENOUS | Status: DC
  Administered 2021-07-14 – 2021-07-15 (×2): 2 g via INTRAVENOUS
  Filled 2021-07-13 (×2): qty 20

## 2021-07-13 MED ORDER — HYDROCOD POLI-CHLORPHE POLI ER 10-8 MG/5ML PO SUER: 5.0000 mL | Freq: Two times a day (BID) | ORAL | Status: DC | PRN

## 2021-07-13 MED ORDER — ACETAMINOPHEN 325 MG PO TABS
650.0000 mg | ORAL_TABLET | Freq: Four times a day (QID) | ORAL | Status: DC
  Administered 2021-07-13 – 2021-07-15 (×7): 650 mg via ORAL
  Filled 2021-07-13 (×7): qty 2

## 2021-07-13 MED ORDER — MIDAZOLAM HCL 5 MG/5ML IJ SOLN
INTRAMUSCULAR | Status: DC | PRN
  Administered 2021-07-13: 2 mg via INTRAVENOUS

## 2021-07-13 MED ORDER — FENTANYL CITRATE PF 50 MCG/ML IJ SOSY
50.0000 ug | PREFILLED_SYRINGE | Freq: Once | INTRAMUSCULAR | Status: AC
  Administered 2021-07-13: 50 ug via INTRAVENOUS
  Filled 2021-07-13: qty 1

## 2021-07-13 MED ORDER — FENTANYL CITRATE (PF) 100 MCG/2ML IJ SOLN
INTRAMUSCULAR | Status: DC | PRN
Start: 2021-07-13 — End: 2021-07-13
  Administered 2021-07-13 (×2): 50 ug via INTRAVENOUS

## 2021-07-13 MED ORDER — ACETAMINOPHEN 650 MG RE SUPP: 650.0000 mg | Freq: Four times a day (QID) | RECTAL | Status: DC | PRN

## 2021-07-13 MED ORDER — OXYCODONE HCL 5 MG PO TABS
5.0000 mg | ORAL_TABLET | Freq: Three times a day (TID) | ORAL | Status: DC | PRN
Start: 2021-07-13 — End: 2021-07-15
  Administered 2021-07-13: 5 mg via ORAL
  Filled 2021-07-13 (×2): qty 1

## 2021-07-13 MED ORDER — TAMSULOSIN HCL 0.4 MG PO CAPS
0.4000 mg | ORAL_CAPSULE | Freq: Every day | ORAL | Status: DC
  Administered 2021-07-13 – 2021-07-14 (×2): 0.4 mg via ORAL
  Filled 2021-07-13 (×2): qty 1

## 2021-07-13 MED ORDER — MELATONIN 5 MG PO TABS
5.0000 mg | ORAL_TABLET | Freq: Every evening | ORAL | Status: DC | PRN
  Administered 2021-07-14 (×2): 5 mg via ORAL
  Filled 2021-07-13 (×2): qty 1

## 2021-07-13 MED ORDER — SODIUM CHLORIDE 0.9 % IV SOLN
100.0000 mg | Freq: Every day | INTRAVENOUS | Status: DC
  Filled 2021-07-13: qty 20

## 2021-07-13 SURGICAL SUPPLY — 5 items
CATH URETL OPEN END 6FR 70 (CATHETERS) ×1 IMPLANT
GUIDEWIRE STR DUAL SENSOR (WIRE) ×1 IMPLANT
PACK CYSTO (CUSTOM PROCEDURE TRAY) ×1 IMPLANT
STENT URET 6FRX24 CONTOUR (STENTS) ×1 IMPLANT
TUBING CONNECTING 10 (TUBING) ×1 IMPLANT

## 2021-07-13 NOTE — ED Provider Notes (Signed)
[facility] Provider Note   CSN: [id number] Arrival date & time: 07/13/21  1355     History  Chief Complaint  Patient presents with   Flank Pain    Kyle Miller is a 46 y.o. male.  HPI Patient presents with his wife who assists with history.  He presents 1 week after being diagnosed with kidney stone, and following failed lithotripsy, now with concern for fever, chills, flank pain.  No confusion, disorientation, chest pain, dyspnea, but with worsening symptoms over the past day, no relief with OTC medication or narcotics who presents for evaluation after having spoke with his urologist.    Home Medications Prior to Admission medications   Medication Sig Start Date End Date Taking? Authorizing Provider  albuterol (VENTOLIN HFA) 108 (90 Base) MCG/ACT inhaler TAKE 2 PUFFS BY MOUTH EVERY 6 HOURS AS NEEDED FOR WHEEZE OR SHORTNESS OF BREATH 11/10/20   Saguier, Percell Miller, PA-C  azelastine (ASTELIN) 0.1 % nasal spray Place 2 sprays into both nostrils 2 (two) times daily. Use in each nostril as directed 05/06/21   Saguier, Percell Miller, PA-C  celecoxib (CELEBREX) 100 MG capsule TAKE 1 CAPSULE BY MOUTH TWICE A DAY 04/25/21   Saguier, Percell Miller, PA-C  famotidine (PEPCID) 20 MG tablet TAKE 1 TABLET BY MOUTH TWICE A DAY 02/22/21   Saguier, Percell Miller, PA-C  Melatonin 5 MG TABS Take by mouth at bedtime as needed (Sleep).    [provider]  oxyCODONE (ROXICODONE) 5 MG immediate release tablet Take 1 tablet (5 mg total) by mouth every 8 (eight) hours as needed. 07/07/21 07/07/22  Franchot Gallo, MD  pantoprazole (PROTONIX) 40 MG tablet Take 1 tablet (40 mg total) by mouth 2 (two) times daily. 05/16/21   Saguier, Percell Miller, PA-C  sucralfate (CARAFATE) 1 g tablet TAKE 1 TABLET BY MOUTH TWICE A DAY 06/09/21   Saguier, Percell Miller, PA-C  tamsulosin (FLOMAX) 0.4 MG CAPS capsule Take 1 capsule (0.4 mg total) by mouth daily after supper. 07/07/21   Franchot Gallo, MD  traZODone (DESYREL) 50 MG tablet  TAKE 1/2-1 TABLET BY MOUTH AT BEDTIME AS NEEDED FOR SLEEP. 12/04/20   Saguier, Percell Miller, PA-C  Vitamin D, Ergocalciferol, (DRISDOL) 1.25 MG (50000 UNIT) CAPS capsule Take 1 capsule (50,000 Units total) by mouth every 7 (seven) days. 10/05/20   Saguier, Percell Miller, PA-C      Allergies    Hydrocodone    Review of Systems   Review of Systems  Constitutional:        Per HPI, otherwise negative  HENT:         Per HPI, otherwise negative  Respiratory:         Per HPI, otherwise negative  Cardiovascular:        Per HPI, otherwise negative  Gastrointestinal:  Positive for nausea. Negative for vomiting.  Endocrine:       Negative aside from HPI  Genitourinary:        Neg aside from HPI   Musculoskeletal:        Per HPI, otherwise negative  Skin: Negative.   Neurological:  Negative for syncope.   Physical Exam Updated Vital Signs BP 135/80    Pulse (!) 114    Temp (!) 100.6 F (38.1 C) (Oral)    Resp 20    Ht 6\' 1"  (1.854 m)    Wt 98.4 kg    SpO2 97%    BMI 28.62 kg/m  Physical Exam Vitals and nursing note reviewed.  Constitutional:  General: He is not in acute distress.    Appearance: He is well-developed.  HENT:     Head: Normocephalic and atraumatic.  Eyes:     Conjunctiva/sclera: Conjunctivae normal.  Cardiovascular:     Rate and Rhythm: Regular rhythm. Tachycardia present.     Pulses: Normal pulses.  Pulmonary:     Effort: Pulmonary effort is normal. No respiratory distress.     Breath sounds: No stridor.  Abdominal:     General: There is no distension.  Skin:    General: Skin is warm and dry.  Neurological:     Mental Status: He is alert and oriented to person, place, and time.    ED Results / Procedures / Treatments   Labs (all labs ordered are listed, but only abnormal results are displayed) Labs Reviewed  COMPREHENSIVE METABOLIC PANEL - Abnormal; Notable for the following components:      Result Value   Creatinine, Ser 1.66 (*)    GFR, Estimated 51 (*)    All  other components within normal limits  CBC WITH DIFFERENTIAL/PLATELET - Abnormal; Notable for the following components:   WBC 16.4 (*)    Neutro Abs 13.5 (*)    Monocytes Absolute 1.5 (*)    Abs Immature Granulocytes 0.09 (*)    All other components within normal limits  RESP PANEL BY RT-PCR (FLU A&B, COVID) ARPGX2  CULTURE, BLOOD (ROUTINE X 2)  CULTURE, BLOOD (ROUTINE X 2)  URINE CULTURE  LACTIC ACID, PLASMA  PROTIME-INR  APTT  LACTIC ACID, PLASMA  URINALYSIS, ROUTINE W REFLEX MICROSCOPIC    EKG EKG Interpretation  Date/Time:  Wednesday July 13 2021 14:41:41 EST Ventricular Rate:  111 PR Interval:  104 QRS Duration: 114 QT Interval:  308 QTC Calculation: 419 R Axis:   50 Text Interpretation: Sinus tachycardia Incomplete right bundle branch block Abnormal ECG Confirmed by Carmin Muskrat [phone]) on 07/13/2021 3:06:47 PM  Radiology No results found.  Procedures Procedures    Medications Ordered in ED Medications  lactated ringers infusion (has no administration in time range)  lactated ringers infusion ( Intravenous New Bag/Given 07/13/21 1542)  cefTRIAXone (ROCEPHIN) 2 g in sodium chloride 0.9 % 100 mL IVPB (2 g Intravenous New Bag/Given 07/13/21 1500)  lactated ringers bolus 1,000 mL (1,000 mLs Intravenous New Bag/Given 07/13/21 1500)  fentaNYL (SUBLIMAZE) injection 50 mcg (50 mcg Intravenous Given 07/13/21 1501)  ondansetron (ZOFRAN) injection 4 mg (4 mg Intravenous Given 07/13/21 1501)  acetaminophen (TYLENOL) tablet 1,000 mg (1,000 mg Oral Given 07/13/21 1541)  chlorhexidine (PERIDEX) 0.12 % solution 15 mL (15 mLs Mouth/Throat Given 07/13/21 1542)    ED Course/ Medical Decision Making/ A&P Clinical Course as of 07/13/21 1630  Wed Jul 13, 2021  1418 Discussed with Dr. Arther Dames. 32mm stone in left ureter - from lithotripsy  Sepsis protocol + admit to hospital service. KUB.  Urology will see - plan for stent. Rocephin.  [WF]    Clinical Course User Index [WF] Tedd Sias, [place]  This patient presents to the ED for concern of abdominal pain, nausea, fever in the context of known kidney stone status post failed lithotripsy, this involves an extensive number of treatment options, and is a complaint that carries with it a high risk of complications and morbidity.  The differential diagnosis includes infected kidney stone, other intra-abdominal processes, bacteremia, sepsis.   Co morbidities that complicate the patient evaluation  Kidney stone   Social Determinants of Health:  None   Additional history obtained:  Additional history and/or information obtained from wife, urologist, and notes from prior failed procedure External records from outside source obtained and reviewed including as above, lithotripsy, known ureteral stone   After the initial evaluation, orders, including: Chemistry, urinalysis as well as IV narcotics, and antiemetics as well as fluids were initiated.  Patient placed on Cardiac and Pulse-Oximetry Monitors. The patient was maintained on a cardiac monitor.  The cardiac monitored showed an rhythm of 115 sinus tach abnormal The patient was also maintained on pulse oximetry. The readings were typically a 9% room air normal  On repeat evaluation of the patient stayed the same  Lab Tests:  I personally interpreted labs.  The pertinent results include: Elevated creatinine, leukocytosis both consistent with nausea, decreased oral hydration secondary to symptoms and concern for sepsis given known kidney stone, source of infection    Consultations Obtained:  I requested consultation with the urologist, Dr. Matilde Sprang,  and discussed lab and imaging findings as well as pertinent plan - they recommend: Patient in preop already for planned stent  Dispostion / Final MDM:  After consideration of the diagnostic results and the patient's response to treatment, he requires admission for emergent stent placement, ongoing IV antibiotic  therapy for infected kidney stone/sepsis.  Case has been discussed with our urology colleagues and our hospitalist colleague.  CRITICAL CARE Performed by: Carmin Muskrat Total critical care time: 35 minutes Critical care time was exclusive of separately billable procedures and treating other patients. Critical care was necessary to treat or prevent imminent or life-threatening deterioration. Critical care was time spent personally by me on the following activities: development of treatment plan with patient and/or surrogate as well as nursing, discussions with consultants, evaluation of patient's response to treatment, examination of patient, obtaining history from patient or surrogate, ordering and performing treatments and interventions, ordering and review of laboratory studies, ordering and review of radiographic studies, pulse oximetry and re-evaluation of patient's condition.     Final Clinical Impression(s) / ED Diagnoses Final diagnoses:  Severe sepsis ([place])  Nephrolithiasis     Carmin Muskrat, MD 07/13/21 1635

## 2021-07-13 NOTE — Anesthesia Postprocedure Evaluation (Signed)
Anesthesia Post Note  Patient: Mali N Zurn  Procedure(s) Performed: CYSTOSCOPY WITH RETROGRADE PYELOGRAM, AND STENT PLACEMENT (Left: Urethra)     Patient location during evaluation: PACU Anesthesia Type: General Level of consciousness: awake and alert Pain management: pain level controlled Vital Signs Assessment: post-procedure vital signs reviewed and stable Respiratory status: spontaneous breathing, nonlabored ventilation and respiratory function stable Cardiovascular status: stable and blood pressure returned to baseline Anesthetic complications: no   No notable events documented.  Last Vitals:  Vitals:   07/13/21 1930 07/13/21 1940  BP:  (!) 142/82  Pulse: (!) 101 98  Resp: 11 11  Temp:  37 C  SpO2: 95% 97%    Last Pain:  Vitals:   07/13/21 1940  TempSrc:   PainSc: 0-No pain                 Audry Pili

## 2021-07-13 NOTE — Sepsis Progress Note (Signed)
eLink is following this Code Sepsis. °

## 2021-07-13 NOTE — Transfer of Care (Signed)
Immediate Anesthesia Transfer of Care Note  Patient: Kyle Miller  Procedure(s) Performed: CYSTOSCOPY WITH RETROGRADE PYELOGRAM, AND STENT PLACEMENT (Left: Urethra)  Patient Location: PACU  Anesthesia Type:General  Level of Consciousness: awake, alert  and oriented  Airway & Oxygen Therapy: Patient Spontanous Breathing  Post-op Assessment: Report given to RN and Post -op Vital signs reviewed and stable  Post vital signs: Reviewed and stable  Last Vitals:  Vitals Value Taken Time  BP 153/77 07/13/21 1911  Temp    Pulse 113 07/13/21 1913  Resp 16 07/13/21 1913  SpO2 94 % 07/13/21 1913  Vitals shown include unvalidated device data.  Last Pain:  Vitals:   07/13/21 1537  TempSrc: Oral  PainSc: 10-Worst pain ever         Complications: No notable events documented.

## 2021-07-13 NOTE — Anesthesia Preprocedure Evaluation (Signed)
Anesthesia Evaluation  Patient identified by MRN, date of birth, ID band Patient awake    Reviewed: Allergy & Precautions, NPO status , Patient's Chart, lab work & pertinent test results  Airway Mallampati: II  TM Distance: >3 FB Neck ROM: Full    Dental  (+) Teeth Intact, Dental Advisory Given   Pulmonary asthma ,    Pulmonary exam normal breath sounds clear to auscultation       Cardiovascular negative cardio ROS   Rhythm:Regular Rate:Tachycardia     Neuro/Psych PSYCHIATRIC DISORDERS Anxiety Depression negative neurological ROS     GI/Hepatic Neg liver ROS, GERD  Medicated,  Endo/Other  negative endocrine ROS  Renal/GU negative Renal ROS     Musculoskeletal negative musculoskeletal ROS (+)   Abdominal   Peds  Hematology negative hematology ROS (+)   Anesthesia Other Findings Day of surgery medications reviewed with the patient.  Reproductive/Obstetrics                             Anesthesia Physical Anesthesia Plan  ASA: 2 and emergent  Anesthesia Plan: General   Post-op Pain Management: Tylenol PO (pre-op)   Induction: Intravenous  PONV Risk Score and Plan: 3 and Midazolam, Dexamethasone and Ondansetron  Airway Management Planned: Oral ETT  Additional Equipment:   Intra-op Plan:   Post-operative Plan: Extubation in OR  Informed Consent: I have reviewed the patients History and Physical, chart, labs and discussed the procedure including the risks, benefits and alternatives for the proposed anesthesia with the patient or authorized representative who has indicated his/her understanding and acceptance.     Dental advisory given  Plan Discussed with: CRNA  Anesthesia Plan Comments:         Anesthesia Quick Evaluation

## 2021-07-13 NOTE — Op Note (Signed)
Preoperative diagnosis: Left ureter stone and urinary tract infection Postoperative diagnosis: Left ureter stone and urinary tract infection Surgery: Cystoscopy left retrograde ureterogram and insertion of left ureteral stent Surgeon: Dr. Nicki Reaper Jeric Slagel  The patient has the above diagnosis and consented the above procedure.  Sepsis protocol given.  Preoperative antibiotics given.  Extra care was taken with leg positioning  23 French cystoscope was utilized.  Penile bulbar membranous urethra was normal.  He had little bit of a high riding bladder neck.  Bladder mucosa and trigone were normal.  No stone in the bladder  A 6 French open-ended ureteral catheter was passed into the lower 3 cm of the distal left ureter over a sensor wire.  A gentle retrograde with 7 cc of contrast may have seen the stone in the proximal left ureter.  It was not for certain.  I did not see the stone for certain on the KUB done just prior to surgery  Sensor wire was easily delivered curling the upper pole calyx.  I well-prepared 26 cm x 6 French stent was negotiated into the renal pelvis and curling in the bladder.  X-rays were taken.  There was no volcano effect  Bladder was emptied and patient was taken to recovery  Patient will have a KUB when he sees his urologist post procedure.  If the stone is not visualized he likely would need a CT scan to make certain the stone is still present.  Clinically he was still having colic and fever

## 2021-07-13 NOTE — Anesthesia Procedure Notes (Signed)
Procedure Name: LMA Insertion Date/Time: 07/13/2021 6:45 PM Performed by: Rosaland Lao, CRNA Pre-anesthesia Checklist: Patient identified, Emergency Drugs available, Suction available and Patient being monitored Patient Re-evaluated:Patient Re-evaluated prior to induction Oxygen Delivery Method: Circle system utilized Preoxygenation: Pre-oxygenation with 100% oxygen Induction Type: IV induction LMA: LMA inserted LMA Size: 5.0 Tube type: Oral Number of attempts: 1 Placement Confirmation: positive ETCO2 and breath sounds checked- equal and bilateral Tube secured with: Tape Dental Injury: Teeth and Oropharynx as per pre-operative assessment

## 2021-07-13 NOTE — H&P (Addendum)
History and Physical    Patient: Kyle Miller IDP:[id number] DOB: [birth year] DOA: 07/13/2021 DOS: the patient was seen and examined on 07/13/2021 PCP: Mackie Pai, PA-C  Patient coming from: Home  Chief Complaint:  Chief Complaint  Patient presents with   Flank Pain   HPI: Kyle Miller is a 46 y.o. male with medical history significant of asthma, bulging lumbar disc, depression, GERD, urolithiasis, varicella zoster who is coming to the hospital due to left flank pain secondary to an urolith associated with fever, chills, sweats and nausea.  He denied chest pain, dyspnea, palpitations, diaphoresis, PND, orthopnea or pitting edema of the lower extremity.  No emesis, diarrhea, but has been constipated for several days.  No polyuria, polydipsia, polyphagia or blurry vision.  ED: Initial vital signs temperature 100.5 F, pulse 124, respiration 18, BP 145/99 mmHg and O2 sat 97% on room air.  The patient received fentanyl 50 mcg IV PV, ondansetron 4 mg IVPB, 1000 mL of LR bolus and ceftriaxone in the emergency department.  Labwork: Urinalysis show small hemoglobinuria and ketonuria of 80 mg/dL.  CBC showed a white count of 16.4 with 83% neutrophils, hemoglobin 13.2 g/dL platelets 276.  PT, INR and PTT were normal.  Lactic acid x2 was normal.  CMP showed a creatinine of 1.66 mg/dL, the rest of the CMP measurements were normal.  Imaging: Recent abdominal plain films showed urolith.  Review of Systems: As mentioned in the history of present illness. All other systems reviewed and are negative.  Past Medical History:  Diagnosis Date   Asthma    Bulging lumbar disc    Depression    GERD (gastroesophageal reflux disease)    History of chicken pox    History of kidney stones    Past Surgical History:  Procedure Laterality Date   EXTRACORPOREAL SHOCK WAVE LITHOTRIPSY Left 07/07/2021   Procedure: EXTRACORPOREAL SHOCK WAVE LITHOTRIPSY (ESWL);  Surgeon: Franchot Gallo, MD;  Location: [facility];  Service: Urology;  Laterality: Left;   WISDOM TOOTH EXTRACTION     Social History:  reports that he has never smoked. He has never used smokeless tobacco. He reports that he does not drink alcohol and does not use drugs.  Allergies  Allergen Reactions   Hydrocodone Itching and Other (See Comments)    Hypersensitivity with whole pill    Family History  Problem Relation Age of Onset   Healthy Mother        Living   Healthy Father        Living   Thyroid disease Brother    Asthma Daughter    Colon polyps Neg Hx    Colon cancer Neg Hx     Prior to Admission medications   Medication Sig Start Date End Date Taking? Authorizing Provider  albuterol (VENTOLIN HFA) 108 (90 Base) MCG/ACT inhaler TAKE 2 PUFFS BY MOUTH EVERY 6 HOURS AS NEEDED FOR WHEEZE OR SHORTNESS OF BREATH 11/10/20   Saguier, Percell Miller, PA-C  azelastine (ASTELIN) 0.1 % nasal spray Place 2 sprays into both nostrils 2 (two) times daily. Use in each nostril as directed 05/06/21   Saguier, Percell Miller, PA-C  celecoxib (CELEBREX) 100 MG capsule TAKE 1 CAPSULE BY MOUTH TWICE A DAY 04/25/21   Saguier, Percell Miller, PA-C  famotidine (PEPCID) 20 MG tablet TAKE 1 TABLET BY MOUTH TWICE A DAY 02/22/21   Saguier, Percell Miller, PA-C  Melatonin 5 MG TABS Take by mouth at bedtime as needed (Sleep).    [provider]  oxyCODONE (  ROXICODONE) 5 MG immediate release tablet Take 1 tablet (5 mg total) by mouth every 8 (eight) hours as needed. 07/07/21 07/07/22  Franchot Gallo, MD  pantoprazole (PROTONIX) 40 MG tablet Take 1 tablet (40 mg total) by mouth 2 (two) times daily. 05/16/21   Saguier, Percell Miller, PA-C  sucralfate (CARAFATE) 1 g tablet TAKE 1 TABLET BY MOUTH TWICE A DAY 06/09/21   Saguier, Percell Miller, PA-C  tamsulosin (FLOMAX) 0.4 MG CAPS capsule Take 1 capsule (0.4 mg total) by mouth daily after supper. 07/07/21   Franchot Gallo, MD  traZODone (DESYREL) 50 MG tablet TAKE 1/2-1 TABLET BY MOUTH AT BEDTIME AS NEEDED FOR SLEEP. 12/04/20    Saguier, Percell Miller, PA-C  Vitamin D, Ergocalciferol, (DRISDOL) 1.25 MG (50000 UNIT) CAPS capsule Take 1 capsule (50,000 Units total) by mouth every 7 (seven) days. 10/05/20   Mackie Pai, PA-C    Physical Exam: Vitals:   07/13/21 1445 07/13/21 1500 07/13/21 1510 07/13/21 1537  BP: (!) 185/105 (!) 171/97  135/80  Pulse: (!) 109 (!) 113  (!) 114  Resp: 17   20  Temp:    (!) 100.6 F (38.1 C)  TempSrc:    Oral  SpO2: 93% 91%  97%  Weight:   98.4 kg   Height:   6\' 1"  (1.854 m)    Physical Exam Constitutional:      Appearance: Normal appearance. He is obese.  HENT:     Head: Normocephalic and atraumatic.  Eyes:     Pupils: Pupils are equal, round, and reactive to light.  Cardiovascular:     Rate and Rhythm: Regular rhythm. Tachycardia present.  Pulmonary:     Effort: Pulmonary effort is normal.     Breath sounds: Normal breath sounds.  Abdominal:     General: Bowel sounds are normal. There is distension.     Palpations: Abdomen is soft.     Tenderness: There is left CVA tenderness. There is no right CVA tenderness.  Musculoskeletal:     Cervical back: Normal range of motion and neck supple.     Right lower leg: No edema.     Left lower leg: No edema.  Neurological:     General: No focal deficit present.     Mental Status: He is alert and oriented to person, place, and time.  Psychiatric:        Mood and Affect: Mood normal.        Behavior: Behavior normal.   Data Reviewed:  There are no new results to review at this time.  Assessment and Plan: Principal Problem:   Sepsis secondary to UTI POA ([place]) In the setting of   Nephrolithiasis Will be going for stent placement. Observation/telemetry. Continue IV fluids. Continue ceftriaxone 2 g IVPB daily. Follow-up blood cultures and sensitivity. Follow urine culture and sensitivity.  Active Problems:   AKI (acute kidney injury) ([place]) Continue IV fluids. Monitor intake and output. Follow-up renal function  electrolytes.    Asthma, moderate Currently asymptomatic. Albuterol MDI as needed. Supplemental oxygen as needed.    GERD (gastroesophageal reflux disease) Continue pantoprazole 40 mg p.o. daily.      Advance Care Planning:   Consults: Urology (Dr. Vikki Ports)  Family Communication:   Severity of Illness: The appropriate patient status for this patient is OBSERVATION. Observation status is judged to be reasonable and necessary in order to provide the required intensity of service to ensure the patient's safety. The patient's presenting symptoms, physical exam findings, and initial radiographic and laboratory data in  the context of their medical condition is felt to place them at decreased risk for further clinical deterioration. Furthermore, it is anticipated that the patient will be medically stable for discharge from the hospital within 2 midnights of admission.   Author: Reubin Milan, MD 07/13/2021 6:10 PM  For on call review [email or web address].

## 2021-07-13 NOTE — Consult Note (Signed)
Urology Consult  Referring physician: Dr Carmin Muskrat Reason for referral: fever and ureter stone  Chief Complaint: fever and stone  History of Present Illness: ESWL 07/07/21 and had pain and residual fragment proximal left ureter. Called today with fever and sent to ER. ER called me and he has chills and given rocephin and sepsis protocol.  KUB demonstrates 7 mm upper pole of left kidney  Patient having left flank pain and some nausea improved with ER Meds  Did not see medicine team prior and spoke with ER      Past Medical History:  Diagnosis Date   Asthma    Bulging lumbar disc    Depression    GERD (gastroesophageal reflux disease)    History of chicken pox    History of kidney stones    Past Surgical History:  Procedure Laterality Date   EXTRACORPOREAL SHOCK WAVE LITHOTRIPSY Left 07/07/2021   Procedure: EXTRACORPOREAL SHOCK WAVE LITHOTRIPSY (ESWL);  Surgeon: Franchot Gallo, MD;  Location: [facility];  Service: Urology;  Laterality: Left;   WISDOM TOOTH EXTRACTION      Medications: I have reviewed the patient's current medications. Allergies:  Allergies  Allergen Reactions   Hydrocodone Itching and Other (See Comments)    Hypersensitivity with whole pill    Family History  Problem Relation Age of Onset   Healthy Mother        Living   Healthy Father        Living   Thyroid disease Brother    Asthma Daughter    Colon polyps Neg Hx    Colon cancer Neg Hx    Social History:  reports that he has never smoked. He has never used smokeless tobacco. He reports that he does not drink alcohol and does not use drugs.  ROS: All systems are reviewed and negative except as noted. Rest negative  Physical Exam:  Vital signs in last 24 hours: Temp:  [100.5 F (38.1 C)-102.5 F (39.2 C)] 100.6 F (38.1 C) (02/08 1537) Pulse Rate:  [109-124] 114 (02/08 1537) Resp:  [10-20] 20 (02/08 1537) BP: (135-185)/(80-105) 135/80 (02/08 1537) SpO2:  [91 %-97  %] 97 % (02/08 1537) Weight:  [98.4 kg] 98.4 kg (02/08 1510)  Cardiovascular: Skin warm; not flushed Respiratory: Breaths quiet; no shortness of breath Abdomen: No masses Neurological: Normal sensation to touch Musculoskeletal: Normal motor function arms and legs Lymphatics: No inguinal adenopathy Skin: No rashes Genitourinary:non-toxic  Laboratory Data:  Results for orders placed or performed during the hospital encounter of 07/13/21 (from the past 72 hour(s))  Lactic acid, plasma     Status: None   Collection Time: 07/13/21  2:22 PM  Result Value Ref Range   Lactic Acid, Venous 0.8 0.5 - 1.9 mmol/L    Comment: Performed at [facility], [place] [address], [place], [place] [address]  Resp Panel by RT-PCR (Flu A&B, Covid) Nasopharyngeal Swab     Status: None   Collection Time: 07/13/21  2:27 PM   Specimen: Nasopharyngeal Swab; Nasopharyngeal(NP) swabs in vial transport medium  Result Value Ref Range   SARS Coronavirus 2 by RT PCR NEGATIVE NEGATIVE    Comment: (NOTE) SARS-CoV-2 target nucleic acids are NOT DETECTED.  The SARS-CoV-2 RNA is generally detectable in upper respiratory specimens during the acute phase of infection. The lowest concentration of SARS-CoV-2 viral copies this assay can detect is 138 copies/mL. A negative result does not preclude SARS-Cov-2 infection and should not be used as the sole  basis for treatment or other patient management decisions. A negative result may occur with  improper specimen collection/handling, submission of specimen other than nasopharyngeal swab, presence of viral mutation(s) within the areas targeted by this assay, and inadequate number of viral copies(<138 copies/mL). A negative result must be combined with clinical observations, patient history, and epidemiological information. The expected result is Negative.  Fact Sheet for Patients:  EntrepreneurPulse.com.au  Fact Sheet for Healthcare  Providers:  IncredibleEmployment.be  This test is no t yet approved or cleared by the [place] FDA and  has been authorized for detection and/or diagnosis of SARS-CoV-2 by FDA under an Emergency Use Authorization (EUA). This EUA will remain  in effect (meaning this test can be used) for the duration of the COVID-19 declaration under Section 564(b)(1) of the Act, 21 U.S.C.section 360bbb-3(b)(1), unless the authorization is terminated  or revoked sooner.       Influenza A by PCR NEGATIVE NEGATIVE   Influenza B by PCR NEGATIVE NEGATIVE    Comment: (NOTE) The Xpert Xpress SARS-CoV-2/FLU/RSV plus assay is intended as an aid in the diagnosis of influenza from Nasopharyngeal swab specimens and should not be used as a sole basis for treatment. Nasal washings and aspirates are unacceptable for Xpert Xpress SARS-CoV-2/FLU/RSV testing.  Fact Sheet for Patients: EntrepreneurPulse.com.au  Fact Sheet for Healthcare Providers: IncredibleEmployment.be  This test is not yet approved or cleared by the [place] FDA and has been authorized for detection and/or diagnosis of SARS-CoV-2 by FDA under an Emergency Use Authorization (EUA). This EUA will remain in effect (meaning this test can be used) for the duration of the COVID-19 declaration under Section 564(b)(1) of the Act, 21 U.S.C. section 360bbb-3(b)(1), unless the authorization is terminated or revoked.  Performed at [facility], [place] [address], [place], [place] [address]   Comprehensive metabolic panel     Status: Abnormal   Collection Time: 07/13/21  2:40 PM  Result Value Ref Range   Sodium 135 135 - 145 mmol/L   Potassium 3.7 3.5 - 5.1 mmol/L   Chloride 101 98 - 111 mmol/L   CO2 26 22 - 32 mmol/L   Glucose, Bld 86 70 - 99 mg/dL    Comment: Glucose reference range applies only to samples taken after fasting for at least 8 hours.   BUN 13 6 - 20 mg/dL    Creatinine, Ser 1.66 (H) 0.61 - 1.24 mg/dL   Calcium 8.9 8.9 - 10.3 mg/dL   Total Protein 7.4 6.5 - 8.1 g/dL   Albumin 4.0 3.5 - 5.0 g/dL   AST 17 15 - 41 U/L   ALT 19 0 - 44 U/L   Alkaline Phosphatase 74 38 - 126 U/L   Total Bilirubin 1.0 0.3 - 1.2 mg/dL   GFR, Estimated 51 (L) >60 mL/min    Comment: (NOTE) Calculated using the CKD-EPI Creatinine Equation (2021)    Anion gap 8 5 - 15    Comment: Performed at [facility], [place] [address], [place], [place] [address]  CBC WITH DIFFERENTIAL     Status: Abnormal   Collection Time: 07/13/21  2:40 PM  Result Value Ref Range   WBC 16.4 (H) 4.0 - 10.5 K/uL   RBC 4.46 4.22 - 5.81 MIL/uL   Hemoglobin 13.2 13.0 - 17.0 g/dL   HCT 40.6 39.0 - 52.0 %   MCV 91.0 80.0 - 100.0 fL   MCH 29.6 26.0 - 34.0 pg   MCHC 32.5 30.0 - 36.0 g/dL  RDW 12.7 11.5 - 15.5 %   Platelets 276 150 - 400 K/uL   nRBC 0.0 0.0 - 0.2 %   Neutrophils Relative % 83 %   Neutro Abs 13.5 (H) 1.7 - 7.7 K/uL   Lymphocytes Relative 7 %   Lymphs Abs 1.2 0.7 - 4.0 K/uL   Monocytes Relative 9 %   Monocytes Absolute 1.5 (H) 0.1 - 1.0 K/uL   Eosinophils Relative 0 %   Eosinophils Absolute 0.1 0.0 - 0.5 K/uL   Basophils Relative 0 %   Basophils Absolute 0.1 0.0 - 0.1 K/uL   Immature Granulocytes 1 %   Abs Immature Granulocytes 0.09 (H) 0.00 - 0.07 K/uL    Comment: Performed at [facility], [place] [address] [place], [place] [address]  Protime-INR     Status: None   Collection Time: 07/13/21  2:40 PM  Result Value Ref Range   Prothrombin Time 13.9 11.4 - 15.2 seconds   INR 1.1 0.8 - 1.2    Comment: (NOTE) INR goal varies based on device and disease states. Performed at [facility], [place] [address], [place], [place] [address]   APTT     Status: None   Collection Time: 07/13/21  2:40 PM  Result Value Ref Range   aPTT 32 24 - 36 seconds    Comment: Performed at [facility], [place] [address], [place], [place] [address]   Recent Results (from the past 240 hour(s))  Resp Panel by RT-PCR (Flu A&B, Covid) Nasopharyngeal Swab     Status: None   Collection Time: 07/13/21  2:27 PM   Specimen: Nasopharyngeal Swab; Nasopharyngeal(NP) swabs in vial transport medium  Result Value Ref Range Status   SARS Coronavirus 2 by RT PCR NEGATIVE NEGATIVE Final    Comment: (NOTE) SARS-CoV-2 target nucleic acids are NOT DETECTED.  The SARS-CoV-2 RNA is generally detectable in upper respiratory specimens during the acute phase of infection. The lowest concentration of SARS-CoV-2 viral copies this assay can detect is 138 copies/mL. A negative result does not preclude SARS-Cov-2 infection and should not be used as the sole basis for treatment or other patient management decisions. A negative result may occur with  improper specimen collection/handling, submission of specimen other than nasopharyngeal swab, presence of viral mutation(s) within the areas targeted by this assay, and inadequate number of viral copies(<138 copies/mL). A negative result must be combined with clinical observations, patient history, and epidemiological information. The expected result is Negative.  Fact Sheet for Patients:  EntrepreneurPulse.com.au  Fact Sheet for Healthcare Providers:  IncredibleEmployment.be  This test is no t yet approved or cleared by the [place] FDA and  has been authorized for detection and/or diagnosis of SARS-CoV-2 by FDA under an Emergency Use Authorization (EUA). This EUA will remain  in effect (meaning this test can be used) for the duration of the COVID-19 declaration under Section 564(b)(1) of the Act, 21 U.S.C.section 360bbb-3(b)(1), unless the authorization is terminated  or revoked sooner.       Influenza A by PCR NEGATIVE NEGATIVE Final   Influenza B by PCR NEGATIVE NEGATIVE Final    Comment: (NOTE) The Xpert Xpress SARS-CoV-2/FLU/RSV  plus assay is intended as an aid in the diagnosis of influenza from Nasopharyngeal swab specimens and should not be used as a sole basis for treatment. Nasal washings and aspirates are unacceptable for Xpert Xpress SARS-CoV-2/FLU/RSV testing.  Fact Sheet for Patients: EntrepreneurPulse.com.au  Fact Sheet for Healthcare Providers: IncredibleEmployment.be  This  test is not yet approved or cleared by the [place] and has been authorized for detection and/or diagnosis of SARS-CoV-2 by FDA under an Emergency Use Authorization (EUA). This EUA will remain in effect (meaning this test can be used) for the duration of the COVID-19 declaration under Section 564(b)(1) of the Act, 21 U.S.C. section 360bbb-3(b)(1), unless the authorization is terminated or revoked.  Performed at [facility], [place] [address] [place], [place] [address]    Creatinine: Recent Labs    07/13/21 1440  CREATININE 1.66*    Xrays: See report/chart Reviewed   Impression/Assessment:  Left ureter stone ? Proximal ureter with fever   Plan:  Needs cysto and retorgrade and stent; pros and cons and risks discussed; pt disappointed not u-scope; sequelae and risk of perc noted; length of admission discussed to medicine  Stent symptoms discussed and delayed u-scope discussed  Angelica Wix A Fiza Nation 07/13/2021, 3:56 PM

## 2021-07-13 NOTE — ED Triage Notes (Signed)
Had lithotripsy this past week, reports that the stone is stuck, has been taking PRN pain meds w/o much relief. Reports N/V.

## 2021-07-13 NOTE — Plan of Care (Signed)

## 2021-07-14 ENCOUNTER — Encounter (HOSPITAL_COMMUNITY): Payer: Self-pay | Admitting: Urology

## 2021-07-14 ENCOUNTER — Encounter: Payer: Self-pay | Admitting: Medical

## 2021-07-14 DIAGNOSIS — N179 Acute kidney failure, unspecified: Secondary | ICD-10-CM | POA: Diagnosis not present

## 2021-07-14 DIAGNOSIS — A419 Sepsis, unspecified organism: Secondary | ICD-10-CM | POA: Diagnosis not present

## 2021-07-14 DIAGNOSIS — N390 Urinary tract infection, site not specified: Secondary | ICD-10-CM | POA: Diagnosis not present

## 2021-07-14 LAB — CBC WITH DIFFERENTIAL/PLATELET
Abs Immature Granulocytes: 0.06 10*3/uL (ref 0.00–0.07)
Basophils Absolute: 0.1 10*3/uL (ref 0.0–0.1)
Basophils Relative: 0 %
Eosinophils Absolute: 0.1 10*3/uL (ref 0.0–0.5)
Eosinophils Relative: 1 %
HCT: 35.1 % — ABNORMAL LOW (ref 39.0–52.0)
Hemoglobin: 11.4 g/dL — ABNORMAL LOW (ref 13.0–17.0)
Immature Granulocytes: 0 %
Lymphocytes Relative: 16 %
Lymphs Abs: 2.3 10*3/uL (ref 0.7–4.0)
MCH: 29.8 pg (ref 26.0–34.0)
MCHC: 32.5 g/dL (ref 30.0–36.0)
MCV: 91.9 fL (ref 80.0–100.0)
Monocytes Absolute: 1.7 10*3/uL — ABNORMAL HIGH (ref 0.1–1.0)
Monocytes Relative: 12 %
Neutro Abs: 10.4 10*3/uL — ABNORMAL HIGH (ref 1.7–7.7)
Neutrophils Relative %: 71 %
Platelets: 226 10*3/uL (ref 150–400)
RBC: 3.82 MIL/uL — ABNORMAL LOW (ref 4.22–5.81)
RDW: 13 % (ref 11.5–15.5)
WBC: 14.6 10*3/uL — ABNORMAL HIGH (ref 4.0–10.5)
nRBC: 0 % (ref 0.0–0.2)

## 2021-07-14 LAB — COMPREHENSIVE METABOLIC PANEL
ALT: 18 U/L (ref 0–44)
AST: 14 U/L — ABNORMAL LOW (ref 15–41)
Albumin: 3 g/dL — ABNORMAL LOW (ref 3.5–5.0)
Alkaline Phosphatase: 59 U/L (ref 38–126)
Anion gap: 7 (ref 5–15)
BUN: 11 mg/dL (ref 6–20)
CO2: 27 mmol/L (ref 22–32)
Calcium: 8.3 mg/dL — ABNORMAL LOW (ref 8.9–10.3)
Chloride: 103 mmol/L (ref 98–111)
Creatinine, Ser: 1.3 mg/dL — ABNORMAL HIGH (ref 0.61–1.24)
GFR, Estimated: >60 mL/min (ref >60)
Glucose, Bld: 107 mg/dL — ABNORMAL HIGH (ref 70–99)
Potassium: 3.8 mmol/L (ref 3.5–5.1)
Sodium: 137 mmol/L (ref 135–145)
Total Bilirubin: 0.4 mg/dL (ref 0.3–1.2)
Total Protein: 6 g/dL — ABNORMAL LOW (ref 6.5–8.1)

## 2021-07-14 LAB — HIV ANTIBODY (ROUTINE TESTING W REFLEX): HIV Screen 4th Generation wRfx: NONREACTIVE

## 2021-07-14 MED ORDER — POLYETHYLENE GLYCOL 3350 17 G PO PACK
17.0000 g | PACK | Freq: Every day | ORAL | Status: DC | PRN
  Administered 2021-07-14: 17 g via ORAL
  Filled 2021-07-14: qty 1

## 2021-07-14 MED ORDER — SODIUM CHLORIDE 0.9 % IV SOLN: INTRAVENOUS | Status: DC

## 2021-07-14 MED ORDER — BISACODYL 10 MG RE SUPP: 10.0000 mg | Freq: Every day | RECTAL | Status: DC | PRN

## 2021-07-14 MED ORDER — SENNOSIDES-DOCUSATE SODIUM 8.6-50 MG PO TABS
1.0000 | ORAL_TABLET | Freq: Two times a day (BID) | ORAL | Status: DC
  Administered 2021-07-14 (×2): 1 via ORAL
  Filled 2021-07-14 (×2): qty 1

## 2021-07-14 NOTE — Progress Notes (Signed)
Urology Progress Note   1 Day Post-Op from left ureteral stent placement due to UTI and concern for obstructing left ureteral stone.   Subjective: NAEON.  Vital signs of stabilized.  No longer spiking fevers.  Voiding spontaneously.  Objective: Vital signs in last 24 hours: Temp:  [98.3 F (36.8 C)-102.5 F (39.2 C)] 98.3 F (36.8 C) (02/09 0356) Pulse Rate:  [93-124] 93 (02/09 0356) Resp:  [10-25] 16 (02/09 0356) BP: (106-185)/(68-105) 122/77 (02/09 0356) SpO2:  [91 %-98 %] 95 % (02/09 0356) Weight:  [98.4 kg] 98.4 kg (02/08 1510)  Intake/Output from previous day: No intake/output data recorded. Intake/Output this shift: No intake/output data recorded.  Physical Exam:  General: Alert and oriented CV: Regular rate Lungs: No increased work of breathing Abdomen: Soft and nontender GU: Voiding spontaneously Ext: NT, No erythema  Lab Results: Recent Labs    07/13/21 1440  HGB 13.2  HCT 40.6   Recent Labs    07/13/21 1440  NA 135  K 3.7  CL 101  CO2 26  GLUCOSE 86  BUN 13  CREATININE 1.66*  CALCIUM 8.9    Studies/Results: DG C-Arm 1-60 Min-No Report  Result Date: 07/13/2021 Fluoroscopy was utilized by the requesting physician.  No radiographic interpretation.    Assessment/Plan:  46 y.o. male s/p left ureteral stent placement overall doing well post-op.   -Continue antibiotics.  Requires a treatment duration for complicated UTI course for 14 days tailored per urine culture data. -Follow-up has been requested with Dr. Abner Greenspan of [facility] for preop appointment for repeat urine culture and for posting for left ureteroscopic stone extraction. -Appreciate hospitalist aid in caring for this patient  Dispo: per primary team   LOS: 0 days

## 2021-07-14 NOTE — Progress Notes (Signed)
PROGRESS NOTE    Kyle Miller  KGM:[id number] DOB: December 29, 1975 DOA: 07/13/2021 PCP: Mackie Pai, PA-C   Brief Narrative:  46 y.o. male with medical history significant of asthma, bulging lumbar disc, depression, GERD, urolithiasis, varicella zoster presented with flank pain with fever, chills and nausea.  On presentation, temperature 100.5 with tachycardia.  WBCs of 16.4 with a creatinine of 1.66.  Recent abdominal films had shown Urolith.  He was started on IV fluids and antibiotics.  He underwent left ureteral stent placement by urology on 07/14/2021.  Assessment & Plan:   Sepsis: Present on admission Acute pyelonephritis secondary to left ureteric stone -Presented with fever, tachycardia, leukocytosis, acute kidney injury with possible acute pyelonephritis from left ureteric stone -Status post cystoscopy and stent placement by urology on 07/14/2021.  Outpatient follow-up with urology. -Continue Rocephin.  Follow cultures.  Continue IV fluids but decrease to 100 cc an hour -Continue Flomax  Acute kidney injury -Probably from above.  Improving.  IV fluids as above  Leukocytosis -Improving.  Monitor  Asthma, moderate -Stable.  Continue albuterol as needed  GERD -Continue Protonix  DVT prophylaxis: SCDs Code Status: Full Family Communication: Wife and mother at bedside Disposition Plan: Status is: Observation The patient will require care spanning > 2 midnights and should be moved to inpatient because: Of need for IV fluids and antibiotics   Consultants: Urology  Procedures: Cystoscopy and left ureteral stent placement on 07/13/2021 by urology  Antimicrobials: Rocephin from 07/13/2021 onwards   Subjective: Patient seen and examined at bedside.  Flank pain is improving.  Currently not nauseous with no vomiting or chest pain.  Objective: Vitals:   07/13/21 1956 07/13/21 2319 07/14/21 0356 07/14/21 0817  BP: (!) 153/97 106/68 122/77 136/87  Pulse: (!) 102 (!) 103 93 88   Resp: 16 18 16 18   Temp: 98.8 F (37.1 C) 99.1 F (37.3 C) 98.3 F (36.8 C) 98.7 F (37.1 C)  TempSrc: Oral Oral    SpO2: 98% 96% 95% 94%  Weight:      Height:       No intake or output data in the 24 hours ending 07/14/21 0835 Filed Weights   07/13/21 1436 07/13/21 1510  Weight: 98.4 kg 98.4 kg    Examination:  General exam: Appears calm and comfortable.  Currently on room air. Respiratory system: Bilateral decreased breath sounds at bases Cardiovascular system: S1 & S2 heard, Rate controlled Gastrointestinal system: Abdomen is nondistended, soft and nontender. Normal bowel sounds heard. Genitourinary: Mild left flank tenderness present Extremities: No cyanosis, clubbing, edema  Central nervous system: Alert and oriented. No focal neurological deficits. Moving extremities Skin: No rashes, lesions or ulcers Psychiatry: Judgement and insight appear normal. Mood & affect appropriate.     Data Reviewed: I have personally reviewed following labs and imaging studies  CBC: Recent Labs  Lab 07/13/21 1440 07/14/21 0436  WBC 16.4* 14.6*  NEUTROABS 13.5* 10.4*  HGB 13.2 11.4*  HCT 40.6 35.1*  MCV 91.0 91.9  PLT 276 644   Basic Metabolic Panel: Recent Labs  Lab 07/13/21 1440 07/14/21 0436  NA 135 137  K 3.7 3.8  CL 101 103  CO2 26 27  GLUCOSE 86 107*  BUN 13 11  CREATININE 1.66* 1.30*  CALCIUM 8.9 8.3*   GFR: Estimated Creatinine Clearance: 86.7 mL/min (A) (by C-G formula based on SCr of 1.3 mg/dL (H)). Liver Function Tests: Recent Labs  Lab 07/13/21 1440 07/14/21 0436  AST 17 14*  ALT 19 18  ALKPHOS 74 59  BILITOT 1.0 0.4  PROT 7.4 6.0*  ALBUMIN 4.0 3.0*   No results for input(s): LIPASE, AMYLASE in the last 168 hours. No results for input(s): AMMONIA in the last 168 hours. Coagulation Profile: Recent Labs  Lab 07/13/21 1440  INR 1.1   Cardiac Enzymes: No results for input(s): CKTOTAL, CKMB, CKMBINDEX, TROPONINI in the last 168 hours. BNP  (last 3 results) No results for input(s): PROBNP in the last 8760 hours. HbA1C: No results for input(s): HGBA1C in the last 72 hours. CBG: No results for input(s): GLUCAP in the last 168 hours. Lipid Profile: No results for input(s): CHOL, HDL, LDLCALC, TRIG, CHOLHDL, LDLDIRECT in the last 72 hours. Thyroid Function Tests: No results for input(s): TSH, T4TOTAL, FREET4, T3FREE, THYROIDAB in the last 72 hours. Anemia Panel: No results for input(s): VITAMINB12, FOLATE, FERRITIN, TIBC, IRON, RETICCTPCT in the last 72 hours. Sepsis Labs: Recent Labs  Lab 07/13/21 1422 07/13/21 1622  LATICACIDVEN 0.8 0.7    Recent Results (from the past 240 hour(s))  Blood Culture (routine x 2)     Status: None (Preliminary result)   Collection Time: 07/13/21  2:22 PM   Specimen: BLOOD  Result Value Ref Range Status   Specimen Description BLOOD BLOOD RIGHT FOREARM  Final   Special Requests   Final    BOTTLES DRAWN AEROBIC ONLY Blood Culture adequate volume Performed at [facility], [place] [address], [place], [place] [address]    Culture PENDING  Incomplete   Report Status PENDING  Incomplete  Resp Panel by RT-PCR (Flu A&B, Covid) Nasopharyngeal Swab     Status: None   Collection Time: 07/13/21  2:27 PM   Specimen: Nasopharyngeal Swab; Nasopharyngeal(NP) swabs in vial transport medium  Result Value Ref Range Status   SARS Coronavirus 2 by RT PCR NEGATIVE NEGATIVE Final    Comment: (NOTE) SARS-CoV-2 target nucleic acids are NOT DETECTED.  The SARS-CoV-2 RNA is generally detectable in upper respiratory specimens during the acute phase of infection. The lowest concentration of SARS-CoV-2 viral copies this assay can detect is 138 copies/mL. A negative result does not preclude SARS-Cov-2 infection and should not be used as the sole basis for treatment or other patient management decisions. A negative result may occur with  improper specimen collection/handling, submission of specimen  other than nasopharyngeal swab, presence of viral mutation(s) within the areas targeted by this assay, and inadequate number of viral copies(<138 copies/mL). A negative result must be combined with clinical observations, patient history, and epidemiological information. The expected result is Negative.  Fact Sheet for Patients:  EntrepreneurPulse.com.au  Fact Sheet for Healthcare Providers:  IncredibleEmployment.be  This test is no t yet approved or cleared by the [place] FDA and  has been authorized for detection and/or diagnosis of SARS-CoV-2 by FDA under an Emergency Use Authorization (EUA). This EUA will remain  in effect (meaning this test can be used) for the duration of the COVID-19 declaration under Section 564(b)(1) of the Act, 21 U.S.C.section 360bbb-3(b)(1), unless the authorization is terminated  or revoked sooner.       Influenza A by PCR NEGATIVE NEGATIVE Final   Influenza B by PCR NEGATIVE NEGATIVE Final    Comment: (NOTE) The Xpert Xpress SARS-CoV-2/FLU/RSV plus assay is intended as an aid in the diagnosis of influenza from Nasopharyngeal swab specimens and should not be used as a sole basis for treatment. Nasal washings and aspirates are unacceptable for Xpert Xpress SARS-CoV-2/FLU/RSV testing.  Fact Sheet for Patients:  EntrepreneurPulse.com.au  Fact Sheet for Healthcare Providers: IncredibleEmployment.be  This test is not yet approved or cleared by the [place] FDA and has been authorized for detection and/or diagnosis of SARS-CoV-2 by FDA under an Emergency Use Authorization (EUA). This EUA will remain in effect (meaning this test can be used) for the duration of the COVID-19 declaration under Section 564(b)(1) of the Act, 21 U.S.C. section 360bbb-3(b)(1), unless the authorization is terminated or revoked.  Performed at [facility], [place] [address], [place], [place] [address]          Radiology Studies: DG C-Arm 1-60 Min-No Report  Result Date: 07/13/2021 Fluoroscopy was utilized by the requesting physician.  No radiographic interpretation.        Scheduled Meds:  acetaminophen  650 mg Oral Q6H   pantoprazole  40 mg Oral Daily   senna-docusate  1 tablet Oral BID   tamsulosin  0.4 mg Oral QPC supper   Continuous Infusions:  cefTRIAXone (ROCEPHIN)  IV     lactated ringers 150 mL/hr at 07/14/21 5993   lactated ringers 10 mL/hr at 07/13/21 1837          Aline August, MD [facility] 07/14/2021, 8:35 AM

## 2021-07-14 NOTE — Progress Notes (Signed)
Looks good  Send home on appropriate antibiotics See Dr Abner Greenspan Tuesday- already on schedule

## 2021-07-15 DIAGNOSIS — N390 Urinary tract infection, site not specified: Secondary | ICD-10-CM | POA: Diagnosis not present

## 2021-07-15 DIAGNOSIS — A419 Sepsis, unspecified organism: Secondary | ICD-10-CM | POA: Diagnosis not present

## 2021-07-15 LAB — CBC WITH DIFFERENTIAL/PLATELET
Abs Immature Granulocytes: 0.04 10*3/uL (ref 0.00–0.07)
Basophils Absolute: 0.1 10*3/uL (ref 0.0–0.1)
Basophils Relative: 1 %
Eosinophils Absolute: 0.3 10*3/uL (ref 0.0–0.5)
Eosinophils Relative: 3 %
HCT: 37.5 % — ABNORMAL LOW (ref 39.0–52.0)
Hemoglobin: 12 g/dL — ABNORMAL LOW (ref 13.0–17.0)
Immature Granulocytes: 0 %
Lymphocytes Relative: 24 %
Lymphs Abs: 2.3 10*3/uL (ref 0.7–4.0)
MCH: 29.9 pg (ref 26.0–34.0)
MCHC: 32 g/dL (ref 30.0–36.0)
MCV: 93.5 fL (ref 80.0–100.0)
Monocytes Absolute: 1.1 10*3/uL — ABNORMAL HIGH (ref 0.1–1.0)
Monocytes Relative: 12 %
Neutro Abs: 5.8 10*3/uL (ref 1.7–7.7)
Neutrophils Relative %: 60 %
Platelets: 268 10*3/uL (ref 150–400)
RBC: 4.01 MIL/uL — ABNORMAL LOW (ref 4.22–5.81)
RDW: 12.9 % (ref 11.5–15.5)
WBC: 9.6 10*3/uL (ref 4.0–10.5)
nRBC: 0 % (ref 0.0–0.2)

## 2021-07-15 LAB — BASIC METABOLIC PANEL
Anion gap: 9 (ref 5–15)
BUN: 11 mg/dL (ref 6–20)
CO2: 26 mmol/L (ref 22–32)
Calcium: 8.7 mg/dL — ABNORMAL LOW (ref 8.9–10.3)
Chloride: 105 mmol/L (ref 98–111)
Creatinine, Ser: 0.89 mg/dL (ref 0.61–1.24)
GFR, Estimated: >60 mL/min (ref >60)
Glucose, Bld: 90 mg/dL (ref 70–99)
Potassium: 3.7 mmol/L (ref 3.5–5.1)
Sodium: 140 mmol/L (ref 135–145)

## 2021-07-15 LAB — URINE CULTURE: Culture: NO GROWTH

## 2021-07-15 LAB — MAGNESIUM: Magnesium: 2 mg/dL (ref 1.7–2.4)

## 2021-07-15 MED ORDER — CEFADROXIL 500 MG PO CAPS: 500.0000 mg | ORAL_CAPSULE | Freq: Two times a day (BID) | ORAL | 0 refills | Status: AC

## 2021-07-15 NOTE — Discharge Summary (Signed)
Physician Discharge Summary   Patient: Kyle Miller MRN: [id number] DOB: 1975/06/22  Admit date:     07/13/2021  Discharge date: 07/15/21  Discharge Physician: Antonieta Pert   PCP: Mackie Pai, PA-C   Recommendations at discharge:   Follow-up with urology next week Tuesday  Discharge Diagnoses:  Discharge diagnosis: Sepsis: Present on admission.  Resolved. Acute pyelonephritis secondary to left ureteric stone. -Status post cystoscopy and stent placement by urology on 07/14/2021.  Outpatient follow-up with urology. -Switch to oral antibiotics at discharge for 14 days course as per urology recommendation follow-up with urology as outpatient continue Flomax.  So far urine culture and blood culture negative.     Acute kidney injury-resolved Leukocytosis: Resolved Asthma, moderate:Stable.  Continue albuterol as needed GERD:Continue Protonix  Hospital Course: 46 y.o. male with medical history significant of asthma, bulging lumbar disc, depression, GERDurolithiasis, varicella zoster presented with flank pain with fever, chills and nausea.  On presentation, temperature 100.5 with tachycardia.  WBCs of 16.4 with a creatinine of 1.66.  Recent abdominal films had shown Urolith.  He was started on IV fluids and antibiotics.  He underwent left ureteral stent placement by urology on 07/14/2021 Patient was treated for sepsis overall leukocytosis resolved, hemodynamically stable.  Urine culture blood culture no growth so far.  Urology advised to discharge home on appropriate antibiotics x 14 daysand follow-up with Dr. Abner Greenspan Tuesday-already scheduled.Overall medically stable.  And he feels ready for discharge home.     Consultants: Urology Procedures performed: Cystoscopy and stent placement 2/9 Disposition: Home Diet recommendation:  Diet Orders (From admission, onward)     Start     Ordered   07/13/21 2022  Diet Heart Room service appropriate? Yes; Fluid consistency: Thin  Diet effective now        Question Answer Comment  Room service appropriate? Yes   Fluid consistency: Thin      07/13/21 2021            DISCHARGE MEDICATION: Allergies as of 07/15/2021       Reactions   Hydrocodone Itching, Other (See Comments)   Hypersensitivity with whole pill        Medication List     TAKE these medications    albuterol 108 (90 Base) MCG/ACT inhaler Commonly known as: VENTOLIN HFA TAKE 2 PUFFS BY MOUTH EVERY 6 HOURS AS NEEDED FOR WHEEZE OR SHORTNESS OF BREATH What changed:  how much to take when to take this reasons to take this   azelastine 0.1 % nasal spray Commonly known as: ASTELIN Place 2 sprays into both nostrils 2 (two) times daily. Use in each nostril as directed What changed:  how much to take when to take this additional instructions   cefadroxil 500 MG capsule Commonly known as: DURICEF Take 1 capsule (500 mg total) by mouth 2 (two) times daily for 13 days.   celecoxib 100 MG capsule Commonly known as: CELEBREX TAKE 1 CAPSULE BY MOUTH TWICE A DAY What changed:  when to take this reasons to take this   famotidine 20 MG tablet Commonly known as: PEPCID TAKE 1 TABLET BY MOUTH TWICE A DAY   melatonin 5 MG Tabs Take 5 mg by mouth at bedtime as needed (Sleep).   oxyCODONE 5 MG immediate release tablet Commonly known as: Roxicodone Take 1 tablet (5 mg total) by mouth every 8 (eight) hours as needed. What changed: reasons to take this   pantoprazole 40 MG tablet Commonly known as: PROTONIX Take 1 tablet (40 mg  total) by mouth 2 (two) times daily. What changed: when to take this   sucralfate 1 g tablet Commonly known as: CARAFATE TAKE 1 TABLET BY MOUTH TWICE A DAY   tamsulosin 0.4 MG Caps capsule Commonly known as: FLOMAX Take 1 capsule (0.4 mg total) by mouth daily after supper. What changed: when to take this   traZODone 50 MG tablet Commonly known as: DESYREL TAKE 1/2-1 TABLET BY MOUTH AT BEDTIME AS NEEDED FOR SLEEP. What changed:  See the new instructions.   Vitamin D (Ergocalciferol) 1.25 MG (50000 UNIT) Caps capsule Commonly known as: DRISDOL Take 1 capsule (50,000 Units total) by mouth every 7 (seven) days.        Follow-up Information     Saguier, Percell Miller, PA-C Follow up in 1 week(s).   Specialties: Internal Medicine, Family Medicine Contact information: [address] [place] [address] [phone]         Janith Lima, MD Follow up.   Specialty: Urology Why: Follow-up as scheduled please call the office in 2 days Contact information: [place] [address] [phone]                 Discharge Exam: Danley Danker Weights   07/13/21 1436 07/13/21 1510  Weight: 98.4 kg 98.4 kg   Condition at discharge: good  The results of significant diagnostics from this hospitalization (including imaging, microbiology, ancillary and laboratory) are listed below for reference.   Imaging Studies: DG Abd 1 View  Result Date: 07/07/2021 CLINICAL DATA:  Preoperative assessment for lithotripsy. Left-sided kidney stone. EXAM: ABDOMEN - 1 VIEW COMPARISON:  06/20/2021. FINDINGS: The bowel gas pattern is normal. A 7 mm calculus is present over the upper pole of the left kidney. Stable phleboliths are noted in the pelvis. IMPRESSION: 7 mm left renal calculus. Electronically Signed   By: Brett Fairy M.D.   On: 07/07/2021 20:08   DG C-Arm 1-60 Min-No Report  Result Date: 07/13/2021 Fluoroscopy was utilized by the requesting physician.  No radiographic interpretation.    Microbiology: Results for orders placed or performed during the hospital encounter of 07/13/21  Blood Culture (routine x 2)     Status: None (Preliminary result)   Collection Time: 07/13/21  2:22 PM   Specimen: BLOOD  Result Value Ref Range Status   Specimen Description BLOOD BLOOD RIGHT FOREARM  Final   Special Requests   Final    BOTTLES DRAWN AEROBIC ONLY Blood Culture adequate volume   Culture   Final     NO GROWTH 2 DAYS Performed at [facility], [place] [address], [place], [place] [address]    Report Status PENDING  Incomplete  Resp Panel by RT-PCR (Flu A&B, Covid) Nasopharyngeal Swab     Status: None   Collection Time: 07/13/21  2:27 PM   Specimen: Nasopharyngeal Swab; Nasopharyngeal(NP) swabs in vial transport medium  Result Value Ref Range Status   SARS Coronavirus 2 by RT PCR NEGATIVE NEGATIVE Final    Comment: (NOTE) SARS-CoV-2 target nucleic acids are NOT DETECTED.  The SARS-CoV-2 RNA is generally detectable in upper respiratory specimens during the acute phase of infection. The lowest concentration of SARS-CoV-2 viral copies this assay can detect is 138 copies/mL. A negative result does not preclude SARS-Cov-2 infection and should not be used as the sole basis for treatment or other patient management decisions. A negative result may occur with  improper specimen collection/handling, submission of specimen other than nasopharyngeal swab, presence  of viral mutation(s) within the areas targeted by this assay, and inadequate number of viral copies(<138 copies/mL). A negative result must be combined with clinical observations, patient history, and epidemiological information. The expected result is Negative.  Fact Sheet for Patients:  EntrepreneurPulse.com.au  Fact Sheet for Healthcare Providers:  IncredibleEmployment.be  This test is no t yet approved or cleared by the [place] FDA and  has been authorized for detection and/or diagnosis of SARS-CoV-2 by FDA under an Emergency Use Authorization (EUA). This EUA will remain  in effect (meaning this test can be used) for the duration of the COVID-19 declaration under Section 564(b)(1) of the Act, 21 U.S.C.section 360bbb-3(b)(1), unless the authorization is terminated  or revoked sooner.       Influenza A by PCR NEGATIVE NEGATIVE Final   Influenza B by PCR NEGATIVE NEGATIVE Final     Comment: (NOTE) The Xpert Xpress SARS-CoV-2/FLU/RSV plus assay is intended as an aid in the diagnosis of influenza from Nasopharyngeal swab specimens and should not be used as a sole basis for treatment. Nasal washings and aspirates are unacceptable for Xpert Xpress SARS-CoV-2/FLU/RSV testing.  Fact Sheet for Patients: EntrepreneurPulse.com.au  Fact Sheet for Healthcare Providers: IncredibleEmployment.be  This test is not yet approved or cleared by the [place] FDA and has been authorized for detection and/or diagnosis of SARS-CoV-2 by FDA under an Emergency Use Authorization (EUA). This EUA will remain in effect (meaning this test can be used) for the duration of the COVID-19 declaration under Section 564(b)(1) of the Act, 21 U.S.C. section 360bbb-3(b)(1), unless the authorization is terminated or revoked.  Performed at [facility], [place] [address], [place], [place] [address]   Blood Culture (routine x 2)     Status: None (Preliminary result)   Collection Time: 07/13/21  2:27 PM   Specimen: BLOOD  Result Value Ref Range Status   Specimen Description   Final    BLOOD BLOOD LEFT FOREARM Performed at [place] [address] [place], [place] [address]    Special Requests   Final    BOTTLES DRAWN AEROBIC AND ANAEROBIC Blood Culture adequate volume Performed at [facility] [address] [place], [place] [address]    Culture   Final    NO GROWTH 2 DAYS Performed at [facility] [place] [address], [place], [place] [address]    Report Status PENDING  Incomplete  Urine Culture     Status: None   Collection Time: 07/13/21  5:02 PM   Specimen: In/Out Cath Urine  Result Value Ref Range Status   Specimen Description   Final    IN/OUT CATH URINE Performed at [place] [address], [place], [place] [address]    Special Requests   Final     NONE Performed at [facility], [place] [address], [place], [place] [address]    Culture   Final    NO GROWTH Performed at [facility], [place] [address], [place], [place] [address]    Report Status 07/15/2021 FINAL  Final    Labs: CBC: Recent Labs  Lab 07/13/21 1440 07/14/21 0436 07/15/21 0510  WBC 16.4* 14.6* 9.6  NEUTROABS 13.5* 10.4* 5.8  HGB 13.2 11.4* 12.0*  HCT 40.6 35.1* 37.5*  MCV 91.0 91.9 93.5  PLT 276 226 962   Basic Metabolic Panel: Recent Labs  Lab 07/13/21 1440 07/14/21 0436 07/15/21 0510  NA 135 137 140  K 3.7 3.8  3.7  CL 101 103 105  CO2 26 27 26   GLUCOSE 86 107* 90  BUN 13 11 11   CREATININE 1.66* 1.30* 0.89  CALCIUM 8.9 8.3* 8.7*  MG  --   --  2.0   Liver Function Tests: Recent Labs  Lab 07/13/21 1440 07/14/21 0436  AST 17 14*  ALT 19 18  ALKPHOS 74 59  BILITOT 1.0 0.4  PROT 7.4 6.0*  ALBUMIN 4.0 3.0*   CBG: No results for input(s): GLUCAP in the last 168 hours.  Discharge time spent: 25 minutes.  Signed: Antonieta Pert, MD [facility] 07/15/2021

## 2021-07-15 NOTE — Clinical Social Work Note (Signed)
°  Transition of Care (TOC) Screening Note   Patient Details  Name: Kyle Miller Date of Birth: December 18, 1975   Transition of Care [facility]) CM/SW Contact:    Trish Mage, LCSW Phone Number: 07/15/2021, 11:01 AM    Transition of Care Department [facility]) has reviewed patient and no TOC needs have been identified at this time. We will continue to monitor patient advancement through interdisciplinary progression rounds. If new patient transition needs arise, please place a TOC consult.

## 2021-07-15 NOTE — Hospital Course (Addendum)
46 y.o. male with medical history significant of asthma, bulging lumbar disc, depression, GERDurolithiasis, varicella zoster presented with flank pain with fever, chills and nausea.  On presentation, temperature 100.5 with tachycardia.  WBCs of 16.4 with a creatinine of 1.66.  Recent abdominal films had shown Urolith.  He was started on IV fluids and antibiotics.  He underwent left ureteral stent placement by urology on 07/14/2021 Patient was treated for sepsis overall leukocytosis resolved, hemodynamically stable.  Urine culture blood culture no growth so far.  Urology advised to discharge home on appropriate antibiotics x 14 daysand follow-up with Dr. Abner Greenspan Tuesday-already scheduled.Overall medically stable.  And he feels ready for discharge home.

## 2021-07-18 LAB — CULTURE, BLOOD (ROUTINE X 2)
Culture: NO GROWTH
Culture: NO GROWTH
Special Requests: ADEQUATE
Special Requests: ADEQUATE

## 2021-07-19 ENCOUNTER — Other Ambulatory Visit: Payer: Self-pay

## 2021-07-19 ENCOUNTER — Encounter (HOSPITAL_BASED_OUTPATIENT_CLINIC_OR_DEPARTMENT_OTHER): Payer: Self-pay | Admitting: Urology

## 2021-07-19 NOTE — Progress Notes (Signed)
Spoke w/ via phone for pre-op interview---pt Lab needs dos---- none              Lab results------ ekg 07-13-2021 chart/epic, cbc bmp 07-15-2021 epic COVID test -----patient states asymptomatic no test needed Arrive at -------1000 am 07-25-2021 NPO after MN NO Solid Food.  Clear liquids from MN until---900 am 07-25-2021 Med rec completed Medications to take morning of surgery ----- azestaline nasal spray prn, albuterol inhaler prn/bring inhaler, tamsulosin, oxybotynin, phenazopyrine Diabetic medication -----n/a Patient instructed no nail polish to be worn day of surgery Patient instructed to bring photo id and insurance card day of surgery Patient aware to have Driver (ride ) / caregiver    for 24 hours after surgery wife Kyle Miller Patient Special Instructions -----none Pre-Op special Istructions -----none Patient verbalized understanding of instructions that were given at this phone interview. Patient denies shortness of breath, chest pain, fever, cough at this phone interview.

## 2021-07-25 ENCOUNTER — Encounter (HOSPITAL_BASED_OUTPATIENT_CLINIC_OR_DEPARTMENT_OTHER): Payer: Self-pay | Admitting: Urology

## 2021-07-25 ENCOUNTER — Ambulatory Visit (HOSPITAL_COMMUNITY)
Admission: RE | Admit: 2021-07-25 | Discharge: 2021-07-25 | Disposition: A | Discharge status: Home / Self Care | Payer: BC Managed Care – PPO | Attending: Urology | Admitting: Urology

## 2021-07-25 ENCOUNTER — Encounter (HOSPITAL_BASED_OUTPATIENT_CLINIC_OR_DEPARTMENT_OTHER)
Admission: RE | Disposition: A | Discharge status: Home / Self Care | Payer: Self-pay | Source: Home / Self Care | Attending: Urology

## 2021-07-25 ENCOUNTER — Ambulatory Visit (HOSPITAL_BASED_OUTPATIENT_CLINIC_OR_DEPARTMENT_OTHER): Payer: BC Managed Care – PPO | Admitting: Anesthesiology

## 2021-07-25 ENCOUNTER — Other Ambulatory Visit: Payer: Self-pay

## 2021-07-25 DIAGNOSIS — K219 Gastro-esophageal reflux disease without esophagitis: Secondary | ICD-10-CM | POA: Insufficient documentation

## 2021-07-25 DIAGNOSIS — N200 Calculus of kidney: Secondary | ICD-10-CM

## 2021-07-25 DIAGNOSIS — J45909 Unspecified asthma, uncomplicated: Secondary | ICD-10-CM | POA: Diagnosis not present

## 2021-07-25 DIAGNOSIS — F418 Other specified anxiety disorders: Secondary | ICD-10-CM | POA: Diagnosis not present

## 2021-07-25 DIAGNOSIS — N202 Calculus of kidney with calculus of ureter: Secondary | ICD-10-CM | POA: Diagnosis present

## 2021-07-25 HISTORY — DX: Personal history of urinary calculi: Z87.442

## 2021-07-25 HISTORY — PX: CYSTOSCOPY/URETEROSCOPY/HOLMIUM LASER/STENT PLACEMENT: SHX6546

## 2021-07-25 SURGERY — CYSTOSCOPY/URETEROSCOPY/HOLMIUM LASER/STENT PLACEMENT
Anesthesia: General | Site: Ureter | Laterality: Left

## 2021-07-25 MED ORDER — CEPHALEXIN 500 MG PO CAPS: 500.0000 mg | ORAL_CAPSULE | Freq: Two times a day (BID) | ORAL | 0 refills | Status: AC

## 2021-07-25 MED ORDER — FENTANYL CITRATE (PF) 100 MCG/2ML IJ SOLN
INTRAMUSCULAR | Status: AC
  Filled 2021-07-25: qty 2

## 2021-07-25 MED ORDER — KETOROLAC TROMETHAMINE 30 MG/ML IJ SOLN
INTRAMUSCULAR | Status: AC
  Filled 2021-07-25: qty 1

## 2021-07-25 MED ORDER — ONDANSETRON HCL 4 MG/2ML IJ SOLN
INTRAMUSCULAR | Status: AC
  Filled 2021-07-25: qty 2

## 2021-07-25 MED ORDER — KETOROLAC TROMETHAMINE 30 MG/ML IJ SOLN
INTRAMUSCULAR | Status: DC | PRN
  Administered 2021-07-25: 30 mg via INTRAVENOUS

## 2021-07-25 MED ORDER — PROMETHAZINE HCL 25 MG/ML IJ SOLN: 6.2500 mg | INTRAMUSCULAR | Status: DC | PRN

## 2021-07-25 MED ORDER — MIDAZOLAM HCL 2 MG/2ML IJ SOLN
INTRAMUSCULAR | Status: AC
  Filled 2021-07-25: qty 2

## 2021-07-25 MED ORDER — SODIUM CHLORIDE 0.9 % IR SOLN
Status: DC | PRN
  Administered 2021-07-25 (×2): 3000 mL

## 2021-07-25 MED ORDER — OXYCODONE-ACETAMINOPHEN 5-325 MG PO TABS
1.0000 | ORAL_TABLET | ORAL | 0 refills | Status: AC | PRN
Start: 2021-07-25 — End: 2021-08-06

## 2021-07-25 MED ORDER — LACTATED RINGERS IV SOLN: INTRAVENOUS | Status: DC

## 2021-07-25 MED ORDER — LIDOCAINE HCL (PF) 2 % IJ SOLN
INTRAMUSCULAR | Status: AC
  Filled 2021-07-25: qty 5

## 2021-07-25 MED ORDER — CEFAZOLIN SODIUM-DEXTROSE 2-4 GM/100ML-% IV SOLN
INTRAVENOUS | Status: AC
  Filled 2021-07-25: qty 100

## 2021-07-25 MED ORDER — AMISULPRIDE (ANTIEMETIC) 5 MG/2ML IV SOLN: 10.0000 mg | Freq: Once | INTRAVENOUS | Status: DC | PRN

## 2021-07-25 MED ORDER — 0.9 % SODIUM CHLORIDE (POUR BTL) OPTIME
TOPICAL | Status: DC | PRN
  Administered 2021-07-25: 500 mL

## 2021-07-25 MED ORDER — ACETAMINOPHEN 10 MG/ML IV SOLN: 1000.0000 mg | Freq: Once | INTRAVENOUS | Status: DC | PRN

## 2021-07-25 MED ORDER — PROPOFOL 10 MG/ML IV BOLUS
INTRAVENOUS | Status: AC
  Filled 2021-07-25: qty 20

## 2021-07-25 MED ORDER — IOHEXOL 300 MG/ML  SOLN
INTRAMUSCULAR | Status: DC | PRN
  Administered 2021-07-25: 9 mL via URETHRAL

## 2021-07-25 MED ORDER — OXYCODONE HCL 5 MG/5ML PO SOLN: 5.0000 mg | Freq: Once | ORAL | Status: AC | PRN

## 2021-07-25 MED ORDER — OXYCODONE HCL 5 MG PO TABS
ORAL_TABLET | ORAL | Status: AC
  Filled 2021-07-25: qty 1

## 2021-07-25 MED ORDER — FENTANYL CITRATE (PF) 100 MCG/2ML IJ SOLN
25.0000 ug | INTRAMUSCULAR | Status: DC | PRN
  Administered 2021-07-25: 50 ug via INTRAVENOUS

## 2021-07-25 MED ORDER — DEXAMETHASONE SODIUM PHOSPHATE 10 MG/ML IJ SOLN
INTRAMUSCULAR | Status: AC
  Filled 2021-07-25: qty 1

## 2021-07-25 MED ORDER — ACETAMINOPHEN 325 MG PO TABS: 325.0000 mg | ORAL_TABLET | ORAL | Status: DC | PRN

## 2021-07-25 MED ORDER — FENTANYL CITRATE (PF) 100 MCG/2ML IJ SOLN
INTRAMUSCULAR | Status: DC | PRN
  Administered 2021-07-25 (×4): 50 ug via INTRAVENOUS

## 2021-07-25 MED ORDER — DOCUSATE SODIUM 100 MG PO CAPS: 100.0000 mg | ORAL_CAPSULE | Freq: Every day | ORAL | 0 refills | Status: DC | PRN

## 2021-07-25 MED ORDER — ONDANSETRON HCL 4 MG/2ML IJ SOLN
INTRAMUSCULAR | Status: DC | PRN
Start: 2021-07-25 — End: 2021-07-25
  Administered 2021-07-25: 4 mg via INTRAVENOUS

## 2021-07-25 MED ORDER — PROPOFOL 10 MG/ML IV BOLUS
INTRAVENOUS | Status: DC | PRN
  Administered 2021-07-25: 270 mg via INTRAVENOUS

## 2021-07-25 MED ORDER — DEXAMETHASONE SODIUM PHOSPHATE 10 MG/ML IJ SOLN
INTRAMUSCULAR | Status: DC | PRN
Start: 2021-07-25 — End: 2021-07-25
  Administered 2021-07-25 (×2): 5 mg via INTRAVENOUS

## 2021-07-25 MED ORDER — WHITE PETROLATUM EX OINT
TOPICAL_OINTMENT | CUTANEOUS | Status: AC
  Filled 2021-07-25: qty 5

## 2021-07-25 MED ORDER — MIDAZOLAM HCL 2 MG/2ML IJ SOLN
INTRAMUSCULAR | Status: DC | PRN
  Administered 2021-07-25: 2 mg via INTRAVENOUS

## 2021-07-25 MED ORDER — ACETAMINOPHEN 160 MG/5ML PO SOLN: 325.0000 mg | ORAL | Status: DC | PRN

## 2021-07-25 MED ORDER — OXYCODONE HCL 5 MG PO TABS
5.0000 mg | ORAL_TABLET | Freq: Once | ORAL | Status: AC | PRN
  Administered 2021-07-25: 5 mg via ORAL

## 2021-07-25 MED ORDER — CEFAZOLIN SODIUM-DEXTROSE 2-4 GM/100ML-% IV SOLN
2.0000 g | Freq: Once | INTRAVENOUS | Status: AC
  Administered 2021-07-25: 2 g via INTRAVENOUS

## 2021-07-25 MED ORDER — LIDOCAINE 2% (20 MG/ML) 5 ML SYRINGE
INTRAMUSCULAR | Status: DC | PRN
  Administered 2021-07-25: 40 mg via INTRAVENOUS

## 2021-07-25 SURGICAL SUPPLY — 22 items
APL SKNCLS STERI-STRIP NONHPOA (GAUZE/BANDAGES/DRESSINGS) ×1
BAG URO CATCHER STRL LF (MISCELLANEOUS) ×2 IMPLANT
BASKET ZERO TIP NITINOL 2.4FR (BASKET) ×1 IMPLANT
BENZOIN TINCTURE PRP APPL 2/3 (GAUZE/BANDAGES/DRESSINGS) ×1 IMPLANT
BSKT STON RTRVL ZERO TP 2.4FR (BASKET) ×1
CATH URET 5FR 28IN OPEN ENDED (CATHETERS) ×1 IMPLANT
CLOTH BEACON ORANGE TIMEOUT ST (SAFETY) ×2 IMPLANT
COVER DOME SNAP 22 D (MISCELLANEOUS) ×1 IMPLANT
DRSG TEGADERM 2-3/8X2-3/4 SM (GAUZE/BANDAGES/DRESSINGS) ×1 IMPLANT
GLOVE SURG ENC TEXT LTX SZ7 (GLOVE) ×2 IMPLANT
GLOVE SURG POLYISO LF SZ7 (GLOVE) ×2 IMPLANT
GOWN STRL REUS W/TWL LRG LVL3 (GOWN DISPOSABLE) ×3 IMPLANT
GUIDEWIRE STR DUAL SENSOR (WIRE) ×4 IMPLANT
IV NS IRRIG 3000ML ARTHROMATIC (IV SOLUTION) ×2 IMPLANT
KIT TURNOVER CYSTO (KITS) ×2 IMPLANT
MANIFOLD NEPTUNE II (INSTRUMENTS) ×2 IMPLANT
PACK CYSTO (CUSTOM PROCEDURE TRAY) ×2 IMPLANT
STENT URET 6FRX26 CONTOUR (STENTS) ×1 IMPLANT
TRACTIP FLEXIVA PULS ID 200XHI (Laser) IMPLANT
TRACTIP FLEXIVA PULSE ID 200 (Laser) ×2
TUBE CONNECTING 12X1/4 (SUCTIONS) ×1 IMPLANT
TUBING UROLOGY SET (TUBING) ×2 IMPLANT

## 2021-07-25 NOTE — Anesthesia Procedure Notes (Signed)
Procedure Name: LMA Insertion Date/Time: 07/25/2021 12:14 PM Performed by: Suan Halter, CRNA Pre-anesthesia Checklist: Patient identified, Emergency Drugs available, Suction available and Patient being monitored Patient Re-evaluated:Patient Re-evaluated prior to induction Oxygen Delivery Method: Circle system utilized Preoxygenation: Pre-oxygenation with 100% oxygen Induction Type: IV induction Ventilation: Mask ventilation without difficulty LMA: LMA inserted LMA Size: 5.0 Number of attempts: 1 Airway Equipment and Method: Bite block Placement Confirmation: positive ETCO2 Tube secured with: Tape Dental Injury: Teeth and Oropharynx as per pre-operative assessment

## 2021-07-25 NOTE — Discharge Instructions (Addendum)
CYSTOSCOPY HOME CARE INSTRUCTIONS  Activity: Rest for the remainder of the day.  Do not drive or operate equipment today.  You may resume normal activities in one to two days as instructed by your physician.   Meals: Drink plenty of liquids and eat light foods such as gelatin or soup this evening.  You may return to a normal meal plan tomorrow.  Return to Work: You may return to work in one to two days or as instructed by your physician.  Special Instructions / Symptoms: Call your physician if any of these symptoms occur:   -persistent or heavy bleeding  -bleeding which continues after first few urination  -large blood clots that are difficult to pass  -urine stream diminishes or stops completely  -fever equal to or higher than 101 degrees Farenheit.  -cloudy urine with a strong, foul odor  -severe pain  Females should always wipe from front to back after elimination.  You may feel some burning pain when you urinate.  This should disappear with time.  Applying moist heat to the lower abdomen or a hot tub bath may help relieve the pain. \  Follow-Up / Date of Return Visit to Your Physician:  Call for an appointment to arrange follow-up.    No ibuprofen, Advil, Aleve, Motrin, ketorolac, meloxicam, naproxen, or other NSAIDS until after 7:00pm today if needed for pain.  Oxycodone can be taken after 6:15pm today as needed.   Remove stent in 3 days (Thursday morning)

## 2021-07-25 NOTE — Transfer of Care (Signed)
Immediate Anesthesia Transfer of Care Note  Patient: Kyle Miller  Procedure(s) Performed: Procedure(s) (LRB): CYSTOSCOPY/RETROGRADE/URETEROSCOPY/HOLMIUM LASER/STENT EXCHANGED (Left)  Patient Location: PACU  Anesthesia Type: General  Level of Consciousness: awake, oriented, sedated and patient cooperative  Airway & Oxygen Therapy: Patient Spontanous Breathing and Patient connected to face mask oxygen  Post-op Assessment: Report given to PACU RN and Post -op Vital signs reviewed and stable  Post vital signs: Reviewed and stable  Complications: No apparent anesthesia complications  Last Vitals:  Vitals Value Taken Time  BP 129/96 07/25/21 1330  Temp    Pulse 96 07/25/21 1340  Resp 13 07/25/21 1340  SpO2 98 % 07/25/21 1340  Vitals shown include unvalidated device data.  Last Pain:  Vitals:   07/25/21 1017  TempSrc: Oral  PainSc: 2          Complications: No notable events documented.

## 2021-07-25 NOTE — H&P (Signed)
Visit Report     07/19/2021   --------------------------------------------------------------------------------   Kyle Miller  MRN: [id number]  DOB: [birth year], 46 year old Male   PRIMARY CARE:  Brunetta Jeans, PA  REFERRING:  Brunetta Jeans, PA  PROVIDER:  Rexene Alberts, M.D.  LOCATION:  [facility] [phone]     --------------------------------------------------------------------------------   CC/HPI: Kyle Miller is a 46 year old male seen in followup with a left ureteral stone.   1. Nephrolithiasis:  -In 2021, he underwent an MRI of his spine for back pain subsequent fusion of his spine that demonstrated possible renal cyst. Subsequent renal bladder ultrasound on 07/15/2019 which revealed a questionable 5 mm nonobstructing calculus of the upper pole of the left kidney with no evidence for any renal cyst.  -CT renal stone protocol 05/03/2020 demonstrated 5 mm stone in the upper pole of the left kidney.  -S/p ESWL on 08/04/2021  -Unfortunately, the stone dropped and obstruct his left ureter.  -CT 07/12/2021 with 7 mm stone in the proximal left ureter  -He developed fever and signs of infection and underwent cystoscopy, left ureteral stent placement on 07/13/2021.   He is having significant stent discomfort with intermittent left flank pain, frequency, urgency. He does have dysuria at the end of the stream. He continues to take 500 mg Keflex. He denies fevers or chills.     ALLERGIES: Hydrocodone - Itching    MEDICATIONS: Omeprazole 20 mg tablet, delayed release 1 tablet PO Daily  Clonazepam 0.5 mg tablet 1 tablet PO Daily  Melatonin 1 PO Daily  Oxycodone Hcl 5 mg tablet 1-2 tablet PO Q 6 H  Sulindac 200 mg tablet 1 tablet PO Daily  Venlafaxine Hcl 37.5 mg tablet 1 tablet PO Daily  Ventolin Hfa 1 PO Daily     GU PSH: ESWL - 07/07/2021     NON-GU PSH: No Non-GU PSH    GU PMH: Flank Pain - 07/12/2021 Renal calculus - 07/12/2021, - 06/20/2021, - 05/03/2020, -  04/12/2020 Primary hypogonadism - 2017, - 2017    NON-GU PMH: Hypogonadotropic hypogonadism - 2017 Anxiety Encounter for general adult medical examination without abnormal findings, Encounter for preventive health examination Heartburn Hypertension Obstructive sleep apnea (adult) (pediatric) Other asthma    FAMILY HISTORY: 12 daughters - Runs in Family   SOCIAL HISTORY: Marital Status: Married Preferred Language: English; Ethnicity: Not Hispanic Or Latino; Race: White Current Smoking Status: Patient has never smoked.  Drinks 2 caffeinated drinks per day. Patient's occupation Recruitment consultant.    REVIEW OF SYSTEMS:    GU Review Male:   Patient denies frequent urination, hard to postpone urination, burning/ pain with urination, get up at night to urinate, leakage of urine, stream starts and stops, trouble starting your stream, have to strain to urinate , erection problems, and penile pain.  Gastrointestinal (Upper):   Patient denies nausea, vomiting, and indigestion/ heartburn.  Gastrointestinal (Lower):   Patient denies constipation and diarrhea.  Constitutional:   Patient denies fever, night sweats, weight loss, and fatigue.  Skin:   Patient denies skin rash/ lesion and itching.  Eyes:   Patient denies blurred vision and double vision.  Ears/ Nose/ Throat:   Patient denies sore throat and sinus problems.  Hematologic/Lymphatic:   Patient denies swollen glands and easy bruising.  Cardiovascular:   Patient denies leg swelling and chest pains.  Respiratory:   Patient denies cough and shortness of breath.  Endocrine:   Patient denies excessive thirst.  Musculoskeletal:   Patient denies  back pain and joint pain.  Neurological:   Patient denies headaches and dizziness.  Psychologic:   Patient denies depression and anxiety.   VITAL SIGNS: None   MULTI-SYSTEM PHYSICAL EXAMINATION:    Constitutional: Well-nourished. No physical deformities. Normally developed. Good grooming.  Respiratory:  No labored breathing, no use of accessory muscles.   Cardiovascular: Normal temperature, normal extremity pulses, no swelling, no varicosities.  Gastrointestinal: No mass, no tenderness, no rigidity, non obese abdomen.     Complexity of Data:  Source Of History:  Patient, Medical Record Summary  Urine Test Review:   Urinalysis  X-Ray Review: C.T. Abdomen/Pelvis: Reviewed Films. Reviewed Report. Discussed With Patient.     01/24/16  Hormones  Testosterone, Total 357.2 pg/dL    PROCEDURES:          Urinalysis w/Scope Dipstick Dipstick Cont'd Micro  Color: Yellow Bilirubin: Neg mg/dL WBC/hpf: 0 - 5/hpf  Appearance: Clear Ketones: Neg mg/dL RBC/hpf: 10 - 20/hpf  Specific Gravity: 1.010 Blood: 2+ ery/uL Bacteria: NS (Not Seen)  pH: 6.5 Protein: Neg mg/dL Cystals: NS (Not Seen)  Glucose: Neg mg/dL Urobilinogen: 0.2 mg/dL Casts: NS (Not Seen)    Nitrites: Neg Trichomonas: Not Present    Leukocyte Esterase: Trace leu/uL Mucous: Not Present      Epithelial Cells: 0 - 5/hpf      Yeast: NS (Not Seen)      Sperm: Not Present    ASSESSMENT:      ICD-10 Details  1 GU:   Flank Pain - R10.84   2   Ureteral calculus - N20.1    PLAN:            Medications New Meds: Ditropan Xl 10 mg tablet, extended release 24 hr 1 tablet PO Daily   #30  1 Refill(s)  Pyridium 100 mg tablet 1 tablet PO TID PRN   #30  1 Refill(s)  Pharmacy Name:  [id number]  Address:  [place]   [place],  [address]  Phone:  [phone]  Fax:  [phone]            Orders Labs CULTURE, URINE          Document Letter(s):  Created for Patient: Clinical Summary         Notes:   #1. Left ureteral stone:  -He is scheduled for cystoscopy, left retrograde pyelogram, left ureteroscopy with lithotripsy and basket traction of stone on 07/25/2021  -Continue Keflex through surgery for recent UTI.  -Urinalysis 07/19/2021 with no sign of infection. We will send urine for culture.  -We discussed risk  and benefits of procedure.  -We will plan to leave his stent on a string for 3 days and have him remove this. He   CC: Raiford Noble, PA        Next Appointment:      Next Appointment: 07/25/2021 12:00 PM    Appointment Type: Surgery     Location: [facility] [phone]    Provider: Rexene Alberts, M.D.    Reason for Visit: NE/OP CYSTO, LT URS, HLL, RPG, STENT   Urology Preoperative H&P   Chief Complaint: Left ureteral sotne  History of Present Illness: Kyle Miller is a 46 y.o. male with a left ureteral stone here for L URS/LL, L stent placement. Negative preop Ucx.     Past Medical History:  Diagnosis Date   Asthma    Bulging lumbar disc    Depression    GERD (gastroesophageal  reflux disease)    History of chicken pox    as child   History of kidney stones    Severe Sepsis ([place]) 07/13/2021   admitted x 2 days    Past Surgical History:  Procedure Laterality Date   CYSTOSCOPY WITH RETROGRADE PYELOGRAM, URETEROSCOPY AND STENT PLACEMENT Left 07/13/2021   Procedure: CYSTOSCOPY WITH RETROGRADE PYELOGRAM, AND STENT PLACEMENT;  Surgeon: Bjorn Loser, MD;  Location: [facility];  Service: Urology;  Laterality: Left;   EXTRACORPOREAL SHOCK WAVE LITHOTRIPSY Left 07/07/2021   Procedure: EXTRACORPOREAL SHOCK WAVE LITHOTRIPSY (ESWL);  Surgeon: Franchot Gallo, MD;  Location: [facility];  Service: Urology;  Laterality: Left;   WISDOM TOOTH EXTRACTION     as child    Allergies:  Allergies  Allergen Reactions   Hydrocodone Itching and Other (See Comments)    Hypersensitivity with whole pill    Family History  Problem Relation Age of Onset   Healthy Mother        Living   Healthy Father        Living   Thyroid disease Brother    Asthma Daughter    Colon polyps Neg Hx    Colon cancer Neg Hx     Social History:  reports that he has never smoked. He has never used smokeless tobacco. He reports that he does not drink alcohol and does not  use drugs.  ROS: A complete review of systems was performed.  All systems are negative except for pertinent findings as noted.  Physical Exam:  Vital signs in last 24 hours: Temp:  [96.8 F (36 C)] 96.8 F (36 C) (02/20 1017) Pulse Rate:  [95] 95 (02/20 1017) Resp:  [17] 17 (02/20 1017) BP: (143)/(103) 143/103 (02/20 1017) SpO2:  [100 %] 100 % (02/20 1017) Weight:  [97.7 kg] 97.7 kg (02/20 1017) Constitutional:  Alert and oriented, No acute distress Cardiovascular: Regular rate and rhythm Respiratory: Normal respiratory effort, Lungs clear bilaterally GI: Abdomen is soft, nontender, nondistended, no abdominal masses GU: No CVA tenderness Lymphatic: No lymphadenopathy Neurologic: Grossly intact, no focal deficits Psychiatric: Normal mood and affect  Laboratory Data:  No results for input(s): WBC, HGB, HCT, PLT in the last 72 hours.  No results for input(s): NA, K, CL, GLUCOSE, BUN, CALCIUM, CREATININE in the last 72 hours.  Invalid input(s): CO3   No results found for this or any previous visit (from the past 24 hour(s)). No results found for this or any previous visit (from the past 240 hour(s)).  Renal Function: No results for input(s): CREATININE in the last 168 hours. Estimated Creatinine Clearance: 126.3 mL/min (by C-G formula based on SCr of 0.89 mg/dL).  Radiologic Imaging: No results found.  I independently reviewed the above imaging studies.  Assessment and Plan Kyle Miller is a 46 y.o. male with left ureteral stone here for left ureteral stone here for L URS/LL, L stent exchange.  -The risks, benefits and alternatives of cystoscopy with L URS/LL, L stent placement. JJ stent placement was discussed with the patient.  Risks include, but are not limited to: bleeding, urinary tract infection, ureteral injury, ureteral stricture disease, chronic pain, urinary symptoms, bladder injury, stent migration, the need for nephrostomy tube placement, MI, CVA, DVT, PE and  the inherent risks with general anesthesia.  The patient voices understanding and wishes to proceed.   Matt R. Abdulrahman Bracey MD 07/25/2021, 10:58 AM  [facility] Pager: [phone]

## 2021-07-25 NOTE — Op Note (Signed)
Operative Note  Preoperative diagnosis:  1.  Left ureteral stone  Postoperative diagnosis: 1.  Left renal stone  Procedure(s): 1.  Cystoscopy 2. Left ureteroscopy with laser lithotripsy and basket extraction of stones 3. Left retrograde pyelogram 4. Left ureteral stent exchange 5. Fluoroscopy with intraoperative interpretation  Surgeon: Rexene Alberts, MD  Assistants:  None  Anesthesia:  General  Complications:  None  EBL:  Minimal  Specimens: 1. Stones for stone analysis (to be done at [facility])  Drains/Catheters: 1.  Left 6Fr x 26cm ureteral stent WITH a tether string  Intraoperative findings:   Cystoscopy demonstrated elevated bladder neck, slight narrowing in bulbar urethra, easily passable with scope.  Left ureteroscopy demonstrated no stone in the ureter. There was an approximately 37mm left upper pole stone that was dusted into sand. A small portion was removed for analysis but all other fragments were tiny, less than the size of the laser fiber. Left retrograde pyelogram demonstrated no hydronephrosis. Successful left stent placement.  Indication:  Kyle Miller is a 46 y.o. male with a history of left renal stone. He underwent ESWL and unfortunately the stone dropped into the ureter and he developed obstruction s/p left stent placement. He is here today for definitive treatment of his stone.  Description of procedure: After informed consent was obtained from the patient, the patient was identified and taken to the operating room and placed in the supine position.  General anesthesia was administered as well as perioperative IV antibiotics.  At the beginning of the case, a time-out was performed to properly identify the patient, the surgery to be performed, and the surgical site.  Sequential compression devices were applied to the lower extremities at the beginning of the case for DVT prophylaxis.  The patient was then placed in the dorsal lithotomy supine  position, prepped and draped in sterile fashion.  We then passed the 21-French rigid cystoscope through the urethra and into the bladder under vision without any difficulty, noting a normal urethra without strictures and a a mildly obstructing prostate with an elevated bladder neck.  A systematic evaluation of the bladder revealed no evidence of any suspicious bladder lesions.  Ureteral orifices were in normal position.    The distal aspect of the ureteral stent was seen protruding from the left ureteral orifice.  We then used the alligator-tooth forceps and grasped the distal end of the ureteral stent and brought it out the urethral meatus while watching the proximal coil straighten out nicely on fluoroscopy. Through the ureteral stent, we then passed a 0.038 sensor wire up to the level of the renal pelvis.  The ureteral stent was then removed, leaving the sensor wire up the left ureter.    Under cystoscopic and flouroscopic guidance, we cannulated the left ureteral orifice with a 5-French open-ended ureteral catheter and a gentle retrograde pyelogram was performed, revealing a normal caliber ureter without any filling defects. There was no hydronephrosis of the collecting system. A 0.038 sensor wire was then passed up to the level of the renal pelvis and secured to the drape as a safety wire. The ureteral catheter and cystoscope were removed, leaving the safety wire in place.   A semi-rigid ureteroscope was passed alongside the wire up the distal ureter which appeared normal. A second 0.038 sensor wire was passed under direct vision and the semirigid scope was removed. The flexible ureteroscope was advanced into the collecting system. The collecting system was inspected. The calculus was identified at the left upper pole.  Using the 200 micron holmium laser fiber, the stone was fragmented completely. A 2.2 Fr zero tip basket was used to remove the fragments under visual guidance. These were sent for  chemical analysis. With the ureteroscope in the kidney, a gentle pyelogram was performed to delineate the calyceal system and we evaluated the calyces systematically. We encountered no significant stone fragment residual. The rest of the stone fragments were very tiny and these were  irrigated away gently. The calyces were re-inspected and there were no significant stone fragment residual.   We then withdrew the ureteroscope back down the ureter, noting no evidence of any stones along the course of the ureter.  Prior to removing the ureteroscope, we did pass the Glidewire back up to the ureter to the renal pelvis.  Once the ureteroscope was removed, we then used the Glidewire under fluoroscopic guidance and passed up a 6-French x 26 cm double-pigtail ureteral stent up the ureter, making sure that the proximal and distal ends coiled within the kidney and bladder respectively.  Once the ureteroscope was removed, the Glidewire was backloaded through the rigid cystoscope, which was then advanced down the urethra and into the bladder. We then used the Glidewire under direct vision through the rigid cystoscope and under fluoroscopic guidance and passed up a 6-French, 26 cm double-pigtail ureteral stent up ureter, making sure that the proximal and distal ends coiled within the kidney and bladder respectively.  Note that we left a long tether string attached to the distal end of the ureteral stent and it exited the urethral meatus and was secured to the penile shaft with a tegaderm adhesive.  The cystoscope was then advanced back into the bladder under vision.  We were able to see the distal stent coiling nicely within the bladder.  The bladder was then emptied with irrigation solution.  The cystoscope was then removed.    The patient tolerated the procedure well and there was no complication. Patient was awoken from anesthesia and taken to the recovery room in stable condition. I was present and scrubbed for the  entirety of the case.  Plan:  Patient will be discharged home. He will remove his stent at home in 3 days. F/u as scheduled.   [facility]  Pager: [phone]

## 2021-07-25 NOTE — Anesthesia Preprocedure Evaluation (Addendum)
Anesthesia Evaluation  Patient identified by MRN, date of birth, ID band Patient awake    Reviewed: Allergy & Precautions, NPO status , Patient's Chart, lab work & pertinent test results  Airway Mallampati: I  TM Distance: >3 FB Neck ROM: Full    Dental  (+) Teeth Intact, Dental Advisory Given   Pulmonary asthma ,    breath sounds clear to auscultation       Cardiovascular  Rhythm:Regular Rate:Normal     Neuro/Psych PSYCHIATRIC DISORDERS Anxiety Depression    GI/Hepatic Neg liver ROS, GERD  ,  Endo/Other  negative endocrine ROS  Renal/GU Renal disease     Musculoskeletal negative musculoskeletal ROS (+)   Abdominal Normal abdominal exam  (+)   Peds  Hematology negative hematology ROS (+)   Anesthesia Other Findings   Reproductive/Obstetrics                            Anesthesia Physical Anesthesia Plan  ASA: 2  Anesthesia Plan: General   Post-op Pain Management:    Induction: Intravenous  PONV Risk Score and Plan: 3 and Ondansetron, Dexamethasone and Midazolam  Airway Management Planned: LMA  Additional Equipment: None  Intra-op Plan:   Post-operative Plan: Extubation in OR  Informed Consent: I have reviewed the patients History and Physical, chart, labs and discussed the procedure including the risks, benefits and alternatives for the proposed anesthesia with the patient or authorized representative who has indicated his/her understanding and acceptance.     Dental advisory given  Plan Discussed with: CRNA  Anesthesia Plan Comments:        Anesthesia Quick Evaluation

## 2021-07-25 NOTE — Anesthesia Postprocedure Evaluation (Signed)
Anesthesia Post Note  Patient: Kyle Miller  Procedure(s) Performed: CYSTOSCOPY/RETROGRADE/URETEROSCOPY/HOLMIUM LASER/STENT EXCHANGED (Left: Ureter)     Patient location during evaluation: PACU Anesthesia Type: General Level of consciousness: awake Pain management: pain level controlled Respiratory status: spontaneous breathing Cardiovascular status: stable Postop Assessment: no apparent nausea or vomiting Anesthetic complications: no   No notable events documented.  Last Vitals:  Vitals:   07/25/21 1359 07/25/21 1400  BP:  (!) 152/94  Pulse: 97 100  Resp: 16 13  Temp:    SpO2: 99% 96%    Last Pain:  Vitals:   07/25/21 1400  TempSrc:   PainSc: 7                  Julene Rahn

## 2021-07-25 NOTE — Progress Notes (Signed)
Patient very agitated and anxious and despite being A&O x 4, he is unreasonable in listening to nurse's instructions post-op.  States emphatically that his penis is burning and that he needs to pee.  Demanding to get OOB.  Attempts to calm patient unsuccessful as he continues to moan, groan and move around in bed grabbing at penis.  Instructed not to pull at stent string.  Patient allowed to sit on side of bed to attempt to void.  Immediately bolted to standing because as stated "I just can't sit down right now."  Stood with patient as he attempted to void unsuccessfully.  Patient laid back down on stretcher.  Will continue to monitor and will administer pain meds as ordered.

## 2021-07-26 ENCOUNTER — Encounter (HOSPITAL_BASED_OUTPATIENT_CLINIC_OR_DEPARTMENT_OTHER): Payer: Self-pay | Admitting: Urology

## 2021-07-29 ENCOUNTER — Other Ambulatory Visit: Payer: Self-pay | Admitting: Urology

## 2021-08-04 ENCOUNTER — Ambulatory Visit: Payer: BC Managed Care – PPO | Admitting: Medical

## 2021-08-04 ENCOUNTER — Other Ambulatory Visit: Payer: Self-pay | Admitting: Medical

## 2021-08-10 ENCOUNTER — Encounter: Payer: Self-pay | Admitting: Medical

## 2021-08-10 ENCOUNTER — Ambulatory Visit: Payer: BC Managed Care – PPO | Admitting: Medical

## 2021-08-10 VITALS — BP 135/85 | HR 69 | Temp 98.2°F | Resp 18 | Ht 73.0 in | Wt 210.0 lb

## 2021-08-10 DIAGNOSIS — I10 Essential (primary) hypertension: Secondary | ICD-10-CM

## 2021-08-10 DIAGNOSIS — N179 Acute kidney failure, unspecified: Secondary | ICD-10-CM | POA: Diagnosis not present

## 2021-08-10 DIAGNOSIS — F411 Generalized anxiety disorder: Secondary | ICD-10-CM | POA: Diagnosis not present

## 2021-08-10 MED ORDER — LOSARTAN POTASSIUM 25 MG PO TABS: 25.0000 mg | ORAL_TABLET | Freq: Every day | ORAL | 11 refills | Status: DC

## 2021-08-10 NOTE — Progress Notes (Signed)
Subjective:    Patient ID: Kyle Miller, male    DOB: Jul 27, 1975, 46 y.o.   MRN: [id number]  HPI  Pt in for follow up.  Pt summarizes that had rough month after lithotripsy and then stone that blocked ureter. He describes subsequent infection(sepsis) and then needed procedures to remove stone. Pt states pain is now resolved. Reviewed 07-25-21 admission.  Pt states his bp was high in hospital. On review of bp readings they are high in past as well. Pt thinks his bp is elevated in health care settings.   As teenager he remembers told diastolic high. His dad had htn.  At home his bp is High 120's/80 at  home. Checks twice a week or 3 times a week.    Pt had one event near syncope. Referred to cardiologist.   "He has had 1 isolated episode occurred uprights during micturition did not lose consciousness and I do not think he requires an extensive evaluation.  I did tell him with his EKG finding incomplete right bundle branch block that we could do an event monitor and neither of [place] feel is necessary at this time unless he has recurrent episodes.  This episode was quite subtle He has incomplete right bundle branch block typically a variant of normal.  I think he should have yearly EKGs done with wellness exams He may well of hypertension I have asked him to use his digital blood pressure cuff described how to check his blood pressure good technique record numbers and may require antihypertensive therapy."  Generalized anxiety. Taking celexa 10 mg 1/2 tab daily.  Insomnia- pt on trazadone.  Review of Systems  Constitutional:  Negative for chills, fatigue and fever.  Respiratory:  Negative for cough, chest tightness, shortness of breath and wheezing.   Cardiovascular:  Negative for chest pain and palpitations.  Gastrointestinal:  Negative for abdominal pain.  Genitourinary:  Negative for dysuria, flank pain and frequency.  Musculoskeletal:  Negative for back pain, joint swelling and neck  pain.  Neurological:  Negative for dizziness, speech difficulty, light-headedness, numbness and headaches.  Hematological:  Negative for adenopathy. Does not bruise/bleed easily.  Psychiatric/Behavioral:  Negative for behavioral problems, confusion and dysphoric mood.     Past Medical History:  Diagnosis Date   Asthma    Bulging lumbar disc    Depression    GERD (gastroesophageal reflux disease)    History of chicken pox    as child   History of kidney stones    Severe Sepsis ([place]) 07/13/2021   admitted x 2 days     Social History   Socioeconomic History   Marital status: Married    Spouse name: Not on file   Number of children: Not on file   Years of education: Not on file   Highest education level: Not on file  Occupational History   Not on file  Tobacco Use   Smoking status: Never   Smokeless tobacco: Never  Vaping Use   Vaping Use: Never used  Substance and Sexual Activity   Alcohol use: No    Alcohol/week: 0.0 standard drinks   Drug use: No   Sexual activity: Yes    Partners: Female  Other Topics Concern   Not on file  Social History Narrative   He live with wife and youngest daughter.   He works as a [employer] fast streamer.   Highest level of education:  Secretary/administrator, BS   Social Determinants of Health   Financial Resource Strain: Not on  file  Food Insecurity: Not on file  Transportation Needs: Not on file  Physical Activity: Not on file  Stress: Not on file  Social Connections: Not on file  Intimate Partner Violence: Not on file    Past Surgical History:  Procedure Laterality Date   CYSTOSCOPY WITH RETROGRADE PYELOGRAM, URETEROSCOPY AND STENT PLACEMENT Left 07/13/2021   Procedure: Guadalupe Guerra, AND STENT PLACEMENT;  Surgeon: Bjorn Loser, MD;  Location: [facility];  Service: Urology;  Laterality: Left;   CYSTOSCOPY/URETEROSCOPY/HOLMIUM LASER/STENT PLACEMENT Left 07/25/2021   Procedure: CYSTOSCOPY/RETROGRADE/URETEROSCOPY/HOLMIUM LASER/STENT  EXCHANGED;  Surgeon: Janith Lima, MD;  Location: [facility];  Service: Urology;  Laterality: Left;   EXTRACORPOREAL SHOCK WAVE LITHOTRIPSY Left 07/07/2021   Procedure: EXTRACORPOREAL SHOCK WAVE LITHOTRIPSY (ESWL);  Surgeon: Franchot Gallo, MD;  Location[facility];  Service: Urology;  Laterality: Left;   WISDOM TOOTH EXTRACTION     as child    Family History  Problem Relation Age of Onset   Healthy Mother        Living   Healthy Father        Living   Thyroid disease Brother    Asthma Daughter    Colon polyps Neg Hx    Colon cancer Neg Hx     Allergies  Allergen Reactions   Hydrocodone Itching and Other (See Comments)    Hypersensitivity with whole pill    Current Outpatient Medications on File Prior to Visit  Medication Sig Dispense Refill   albuterol (VENTOLIN HFA) 108 (90 Base) MCG/ACT inhaler TAKE 2 PUFFS BY MOUTH EVERY 6 HOURS AS NEEDED FOR WHEEZE OR SHORTNESS OF BREATH (Patient taking differently: 2 puffs every 6 (six) hours as needed for wheezing. TAKE 2 PUFFS BY MOUTH EVERY 6 HOURS AS NEEDED FOR WHEEZE OR SHORTNESS OF BREATH) 18 each 3   azelastine (ASTELIN) 0.1 % nasal spray Place 2 sprays into both nostrils 2 (two) times daily. Use in each nostril as directed (Patient taking differently: Place 1 spray into both nostrils as needed.) 30 mL 3   celecoxib (CELEBREX) 100 MG capsule TAKE 1 CAPSULE BY MOUTH TWICE A DAY (Patient taking differently: Take 100 mg by mouth daily as needed for moderate pain.) 30 capsule 0   docusate sodium (COLACE) 100 MG capsule Take 1 capsule (100 mg total) by mouth daily as needed for up to 30 doses. 30 capsule 0   Melatonin 5 MG TABS Take 5 mg by mouth at bedtime as needed (Sleep). gummy     oxybutynin (DITROPAN-XL) 10 MG 24 hr tablet Take 10 mg by mouth daily.     oxyCODONE (ROXICODONE) 5 MG immediate release tablet Take 1 tablet (5 mg total) by mouth every 8 (eight) hours as needed. (Patient taking  differently: Take 5 mg by mouth every 8 (eight) hours as needed for severe pain.) 10 tablet 0   pantoprazole (PROTONIX) 40 MG tablet Take 1 tablet (40 mg total) by mouth 2 (two) times daily. (Patient taking differently: Take 40 mg by mouth at bedtime.) 180 tablet 1   phenazopyridine (PYRIDIUM) 100 MG tablet Take 100 mg by mouth 3 (three) times daily.     tamsulosin (FLOMAX) 0.4 MG CAPS capsule Take 1 capsule (0.4 mg total) by mouth daily after supper. (Patient taking differently: Take 0.4 mg by mouth daily after breakfast.) 30 capsule 0   traZODone (DESYREL) 50 MG tablet TAKE 1/2-1 TABLET BY MOUTH AT BEDTIME AS NEEDED FOR SLEEP. (Patient taking differently: Take 50 mg  by mouth at bedtime.) 90 tablet 2   Vitamin D, Ergocalciferol, (DRISDOL) 1.25 MG (50000 UNIT) CAPS capsule Take 1 capsule (50,000 Units total) by mouth every 7 (seven) days. (Patient taking differently: Take 50,000 Units by mouth every 7 (seven) days. saturday) 12 capsule 3   No current facility-administered medications on file prior to visit.    BP 133/87    Pulse 69    Temp 98.2 F (36.8 C)    Resp 18    Ht '6\' 1"'$  (1.854 m)    Wt 210 lb (95.3 kg)    SpO2 99%    BMI 27.71 kg/m        Objective:   Physical Exam  General Mental Status- Alert. General Appearance- Not in acute distress.   Skin General: Color- Normal Color. Moisture- Normal Moisture.  Neck Carotid Arteries- Normal color. Moisture- Normal Moisture. No carotid bruits. No JVD.  Chest and Lung Exam Auscultation: Breath Sounds:-Normal.  Cardiovascular Auscultation:Rythm- Regular. Murmurs & Other Heart Sounds:Auscultation of the heart reveals- No Murmurs.  Abdomen Inspection:-Inspeection Normal. Palpation/Percussion:Note:No mass. Palpation and Percussion of the abdomen reveal- Non Tender, Non Distended + BS, no rebound or guarding.  Neurologic Cranial Nerve exam:- CN III-XII intact(No nystagmus), symmetric smile. Strength:- 5/5 equal and symmetric  strength both upper and lower extremities.       Assessment & Plan:   Patient Instructions  Your bp in past has been high in our office and in other health care setting. Bp better at home. Will presribe low dose losartan 25 mg daily. Check bp daily over next 10 days and send me my chart bp update while on med.    Resolved kidney injury after kidney stone removed. Bmp early feb showed good gfr and Cr.  Anxiety and insomnia. Well controlled/treated with celexa and trazadone.  Follow up date to be determined after bp update by my chart in 10 days.     Mackie Pai, PA-C   Time spent with patient today was  30 minutes which consisted of chart review, discussing diagnosis, treatment and documentation.

## 2021-08-10 NOTE — Patient Instructions (Addendum)
Your bp in past has been high in our office and in other health care setting. Bp better at home. Will presribe low dose losartan 25 mg daily. Check bp daily over next 10 days and send me my chart bp update while on med.  ? ? ?Resolved kidney injury after kidney stone removed. Bmp early feb showed good gfr and Cr. ? ?Anxiety and insomnia. Well controlled/treated with celexa and trazadone. ? ?Follow up date to be determined after bp update by my chart in 10 days. ? ? ? ? ?

## 2021-09-01 ENCOUNTER — Other Ambulatory Visit: Payer: Self-pay | Admitting: Medical

## 2021-09-04 ENCOUNTER — Other Ambulatory Visit: Payer: Self-pay | Admitting: Medical

## 2022-02-09 ENCOUNTER — Ambulatory Visit (INDEPENDENT_AMBULATORY_CARE_PROVIDER_SITE_OTHER): Payer: BC Managed Care – PPO | Admitting: Medical

## 2022-02-09 ENCOUNTER — Encounter: Payer: Self-pay | Admitting: Medical

## 2022-02-09 VITALS — BP 136/84 | HR 83 | Resp 18 | Ht 73.0 in | Wt 224.0 lb

## 2022-02-09 DIAGNOSIS — Z23 Encounter for immunization: Secondary | ICD-10-CM | POA: Diagnosis not present

## 2022-02-09 DIAGNOSIS — J3489 Other specified disorders of nose and nasal sinuses: Secondary | ICD-10-CM

## 2022-02-09 DIAGNOSIS — F411 Generalized anxiety disorder: Secondary | ICD-10-CM | POA: Diagnosis not present

## 2022-02-09 DIAGNOSIS — J3089 Other allergic rhinitis: Secondary | ICD-10-CM

## 2022-02-09 DIAGNOSIS — H6690 Otitis media, unspecified, unspecified ear: Secondary | ICD-10-CM

## 2022-02-09 DIAGNOSIS — F32A Depression, unspecified: Secondary | ICD-10-CM

## 2022-02-09 MED ORDER — LEVOCETIRIZINE DIHYDROCHLORIDE 5 MG PO TABS: 5.0000 mg | ORAL_TABLET | Freq: Every evening | ORAL | 11 refills | Status: DC

## 2022-02-09 MED ORDER — CITALOPRAM HYDROBROMIDE 10 MG PO TABS: 10.0000 mg | ORAL_TABLET | Freq: Every day | ORAL | 3 refills | Status: DC

## 2022-02-09 MED ORDER — FLUTICASONE PROPIONATE 50 MCG/ACT NA SUSP: 2.0000 | Freq: Every day | NASAL | 1 refills | Status: DC

## 2022-02-09 MED ORDER — AZITHROMYCIN 250 MG PO TABS: ORAL_TABLET | ORAL | 0 refills | Status: AC

## 2022-02-09 NOTE — Progress Notes (Signed)
Subjective:    Patient ID: Kyle Miller, male    DOB: 05/30/1976, 46 y.o.   MRN: [id number]  HPI Last 2 weeks he notes sinus pressure. Points to frontal and maxillary sinus pressure. He states early onset allergies. Some left ear pressure when chews. Feels pnd. Covid test negative. Using sinus tylenol last 2 weeks. Pt is using astelin recently for 2 weeks.   Pt states not taking bp medication. He only takes sporadically. Pt states when he took his bp was well controlled. He states when he checks bp at home his bp without med less than 140/90.   Pt states anxious and short tempered all the time. He states looses his temper easily. Previously on celexa and trazadone. He uses trazadone for insominia. Gad-7 19 score. Phq-9 9  Jerrye Bushy takes protinix.    Review of Systems  Constitutional:  Negative for chills and fatigue.  HENT:  Positive for congestion, ear pain, postnasal drip and sinus pressure. Negative for sore throat.   Respiratory:  Negative for cough, chest tightness, shortness of breath and wheezing.   Cardiovascular:  Negative for chest pain and palpitations.  Gastrointestinal:  Negative for abdominal pain.  Musculoskeletal:  Negative for back pain.  Neurological:  Negative for dizziness and light-headedness.  Psychiatric/Behavioral:  Positive for dysphoric mood and sleep disturbance. Negative for behavioral problems and suicidal ideas. The patient is nervous/anxious.     Past Medical History:  Diagnosis Date   Asthma    Bulging lumbar disc    Depression    GERD (gastroesophageal reflux disease)    History of chicken pox    as child   History of kidney stones    Severe Sepsis ([place]) 07/13/2021   admitted x 2 days     Social History   Socioeconomic History   Marital status: Married    Spouse name: Not on file   Number of children: Not on file   Years of education: Not on file   Highest education level: Not on file  Occupational History   Not on file  Tobacco Use    Smoking status: Never   Smokeless tobacco: Never  Vaping Use   Vaping Use: Never used  Substance and Sexual Activity   Alcohol use: No    Alcohol/week: 0.0 standard drinks of alcohol   Drug use: No   Sexual activity: Yes    Partners: Female  Other Topics Concern   Not on file  Social History Narrative   He live with wife and youngest daughter.   He works as a [employer] fast streamer.   Highest level of education:  Secretary/administrator, BS   Social Determinants of Health   Financial Resource Strain: Not on file  Food Insecurity: Not on file  Transportation Needs: Not on file  Physical Activity: Not on file  Stress: Not on file  Social Connections: Not on file  Intimate Partner Violence: Not on file    Past Surgical History:  Procedure Laterality Date   CYSTOSCOPY WITH RETROGRADE PYELOGRAM, URETEROSCOPY AND STENT PLACEMENT Left 07/13/2021   Procedure: [place], AND STENT PLACEMENT;  Surgeon: Bjorn Loser, MD;  Location: [facility];  Service: Urology;  Laterality: Left;   CYSTOSCOPY/URETEROSCOPY/HOLMIUM LASER/STENT PLACEMENT Left 07/25/2021   Procedure: CYSTOSCOPY/RETROGRADE/URETEROSCOPY/HOLMIUM LASER/STENT EXCHANGED;  Surgeon: Janith Lima, MD;  Location: [facility];  Service: Urology;  Laterality: Left;   EXTRACORPOREAL SHOCK WAVE LITHOTRIPSY Left 07/07/2021   Procedure: EXTRACORPOREAL SHOCK WAVE LITHOTRIPSY (ESWL);  Surgeon: Franchot Gallo, MD;  Location: [facility];  Service: Urology;  Laterality: Left;   WISDOM TOOTH EXTRACTION     as child    Family History  Problem Relation Age of Onset   Healthy Mother        Living   Healthy Father        Living   Thyroid disease Brother    Asthma Daughter    Colon polyps Neg Hx    Colon cancer Neg Hx     Allergies  Allergen Reactions   Hydrocodone Itching and Other (See Comments)    Hypersensitivity with whole pill    Current Outpatient Medications on File Prior to Visit   Medication Sig Dispense Refill   albuterol (VENTOLIN HFA) 108 (90 Base) MCG/ACT inhaler TAKE 2 PUFFS BY MOUTH EVERY 6 HOURS AS NEEDED FOR WHEEZE OR SHORTNESS OF BREATH (Patient taking differently: 2 puffs every 6 (six) hours as needed for wheezing. TAKE 2 PUFFS BY MOUTH EVERY 6 HOURS AS NEEDED FOR WHEEZE OR SHORTNESS OF BREATH) 18 each 3   azelastine (ASTELIN) 0.1 % nasal spray Place 2 sprays into both nostrils 2 (two) times daily. Use in each nostril as directed (Patient taking differently: Place 1 spray into both nostrils as needed.) 30 mL 3   Melatonin 5 MG TABS Take 5 mg by mouth at bedtime as needed (Sleep). gummy     pantoprazole (PROTONIX) 40 MG tablet Take 1 tablet (40 mg total) by mouth 2 (two) times daily. (Patient taking differently: Take 40 mg by mouth at bedtime.) 180 tablet 1   phenazopyridine (PYRIDIUM) 100 MG tablet Take 100 mg by mouth 3 (three) times daily.     traZODone (DESYREL) 50 MG tablet Take 1 tablet (50 mg total) by mouth at bedtime. 90 tablet 1   Vitamin D, Ergocalciferol, (DRISDOL) 1.25 MG (50000 UNIT) CAPS capsule TAKE 1 CAPSULE (50,000 UNITS TOTAL) BY MOUTH EVERY 7 (SEVEN) DAYS 12 capsule 3   No current facility-administered medications on file prior to visit.    BP (!) 126/94   Pulse 83   Resp 18   Ht '6\' 1"'$  (1.854 m)   Wt 224 lb (101.6 kg)   SpO2 100%   BMI 29.55 kg/m        Objective:   Physical Exam  General- No acute distress. Pleasant patient. Neck- Full range of motion, no jvd Lungs- Clear, even and unlabored. Heart- regular rate and rhythm. Neurologic- CNII- XII grossly intact.  Heent- left tm mild dull pinkish red. Boggy turbinatess and pnd. Sounds nasal congested. Mild sinus pressure.      Assessment & Plan:   Patient Instructions  Allergic rhinitis- continue astelin, add on flonase and start xyzal anthistamine. If still having symtoms by early next week let me know and would add on tapered prednisone.  Ear infection on left side and  may have sinus infection related to above. Rx azithromycin.  For depression, anxiety and insomnia. Refilled celexa today and continue trazadone. Considered filling clonopin and eventually putting you on contract and getting uds if you think this is adequate for anxiety. After discussion we decided that you will update me what you had used previously and then may be able to prescribe that med.  Follow up in one month or sooner if needed.    Mackie Pai, PA-C

## 2022-02-09 NOTE — Addendum Note (Signed)
Addended by: Jeronimo Greaves on: 02/09/2022 09:10 AM   Modules accepted: Orders

## 2022-02-09 NOTE — Patient Instructions (Addendum)
Allergic rhinitis- continue astelin, add on flonase and start xyzal anthistamine. If still having symtoms by early next week let me know and would add on tapered prednisone.  Ear infection on left side and may have sinus infection related to above. Rx azithromycin.  For depression, anxiety and insomnia. Refilled celexa today and continue trazadone. Considered filling clonopin and eventually putting you on contract and getting uds if you think this is adequate for anxiety. After discussion we decided that you will update me what you had used previously and then may be able to prescribe that med.  Follow up in one month or sooner if needed.

## 2022-03-02 ENCOUNTER — Other Ambulatory Visit: Payer: Self-pay | Admitting: Medical

## 2022-03-04 ENCOUNTER — Other Ambulatory Visit: Payer: Self-pay | Admitting: Medical

## 2022-03-07 ENCOUNTER — Other Ambulatory Visit: Payer: Self-pay | Admitting: Medical

## 2022-04-07 ENCOUNTER — Other Ambulatory Visit: Payer: Self-pay | Admitting: Medical

## 2022-05-17 ENCOUNTER — Other Ambulatory Visit: Payer: Self-pay | Admitting: Medical

## 2022-05-31 ENCOUNTER — Other Ambulatory Visit: Payer: Self-pay | Admitting: Medical

## 2022-08-28 ENCOUNTER — Other Ambulatory Visit: Payer: Self-pay | Admitting: Medical

## 2022-09-26 ENCOUNTER — Other Ambulatory Visit: Payer: Self-pay | Admitting: Medical

## 2022-11-06 ENCOUNTER — Telehealth: Payer: BC Managed Care – PPO | Admitting: Physician Assistant

## 2022-11-06 DIAGNOSIS — J019 Acute sinusitis, unspecified: Secondary | ICD-10-CM | POA: Diagnosis not present

## 2022-11-06 DIAGNOSIS — B9789 Other viral agents as the cause of diseases classified elsewhere: Secondary | ICD-10-CM | POA: Diagnosis not present

## 2022-11-06 MED ORDER — PREDNISONE 10 MG (21) PO TBPK: ORAL_TABLET | ORAL | 0 refills | Status: DC

## 2022-11-06 NOTE — Progress Notes (Signed)
I have spent 5 minutes in review of e-visit questionnaire, review and updating patient chart, medical decision making and response to patient.   Jyron Turman Cody Timiyah Romito, PA-C    

## 2022-11-06 NOTE — Progress Notes (Signed)
E-Visit for Sinus Problems  We are sorry that you are not feeling well.  Here is how we plan to help!  Based on what you have shared with me it looks like you have sinusitis.  Sinusitis is inflammation and infection in the sinus cavities of the head.  Based on your presentation I believe you most likely have Acute Viral Sinusitis.This is an infection most likely caused by a virus. There is not specific treatment for viral sinusitis other than to help you with the symptoms until the infection runs its course.  You may use an oral decongestant such as Mucinex D or if you have glaucoma or high blood pressure use plain Mucinex. Saline nasal spray help and can safely be used as often as needed for congestion, I have prescribed: a prednisone dose pack to add on to current regimen to help calm sinus inflammation and pressure. I do recommend a follow-up with your PCP for ongoing management of allergic/sinus symptoms.   Some authorities believe that zinc sprays or the use of Echinacea may shorten the course of your symptoms.  Sinus infections are not as easily transmitted as other respiratory infection, however we still recommend that you avoid close contact with loved ones, especially the very young and elderly.  Remember to wash your hands thoroughly throughout the day as this is the number one way to prevent the spread of infection!  Home Care: Only take medications as instructed by your medical team. Do not take these medications with alcohol. A steam or ultrasonic humidifier can help congestion.  You can place a towel over your head and breathe in the steam from hot water coming from a faucet. Avoid close contacts especially the very young and the elderly. Cover your mouth when you cough or sneeze. Always remember to wash your hands.  Get Help Right Away If: You develop worsening fever or sinus pain. You develop a severe head ache or visual changes. Your symptoms persist after you have completed  your treatment plan.  Make sure you Understand these instructions. Will watch your condition. Will get help right away if you are not doing well or get worse.   Thank you for choosing an e-visit.  Your e-visit answers were reviewed by a board certified advanced clinical practitioner to complete your personal care plan. Depending upon the condition, your plan could have included both over the counter or prescription medications.  Please review your pharmacy choice. Make sure the pharmacy is open so you can pick up prescription now. If there is a problem, you may contact your provider through [employer] and have the prescription routed to another pharmacy.  Your safety is important to [place]. If you have drug allergies check your prescription carefully.   For the next 24 hours you can use MyChart to ask questions about today's visit, request a non-urgent call back, or ask for a work or school excuse. You will get an email in the next two days asking about your experience. I hope that your e-visit has been valuable and will speed your recovery.

## 2022-11-07 ENCOUNTER — Other Ambulatory Visit: Payer: Self-pay | Admitting: Medical

## 2022-11-24 ENCOUNTER — Other Ambulatory Visit: Payer: Self-pay | Admitting: Medical

## 2023-01-18 ENCOUNTER — Encounter: Payer: Self-pay | Admitting: Medical

## 2023-01-23 ENCOUNTER — Emergency Department (HOSPITAL_BASED_OUTPATIENT_CLINIC_OR_DEPARTMENT_OTHER)
Admission: EM | Admit: 2023-01-23 | Discharge: 2023-01-23 | Disposition: A | Discharge status: Home / Self Care | Payer: BC Managed Care – PPO | Attending: Emergency Medicine | Admitting: Emergency Medicine

## 2023-01-23 ENCOUNTER — Encounter (HOSPITAL_BASED_OUTPATIENT_CLINIC_OR_DEPARTMENT_OTHER): Payer: Self-pay

## 2023-01-23 ENCOUNTER — Other Ambulatory Visit: Payer: Self-pay

## 2023-01-23 DIAGNOSIS — M5431 Sciatica, right side: Secondary | ICD-10-CM | POA: Diagnosis not present

## 2023-01-23 DIAGNOSIS — M79604 Pain in right leg: Secondary | ICD-10-CM | POA: Diagnosis present

## 2023-01-23 MED ORDER — LIDOCAINE 5 % EX PTCH: 1.0000 | MEDICATED_PATCH | CUTANEOUS | 0 refills | Status: DC

## 2023-01-23 MED ORDER — LIDOCAINE 5 % EX PTCH
3.0000 | MEDICATED_PATCH | CUTANEOUS | Status: DC
  Administered 2023-01-23: 3 via TRANSDERMAL
  Filled 2023-01-23: qty 3

## 2023-01-23 MED ORDER — MELOXICAM 15 MG PO TABS: 15.0000 mg | ORAL_TABLET | Freq: Every day | ORAL | 0 refills | Status: DC

## 2023-01-23 MED ORDER — METHOCARBAMOL 500 MG PO TABS
500.0000 mg | ORAL_TABLET | Freq: Once | ORAL | Status: AC
  Administered 2023-01-23: 500 mg via ORAL
  Filled 2023-01-23: qty 1

## 2023-01-23 MED ORDER — KETOROLAC TROMETHAMINE 60 MG/2ML IM SOLN
60.0000 mg | Freq: Once | INTRAMUSCULAR | Status: AC
  Administered 2023-01-23: 60 mg via INTRAMUSCULAR
  Filled 2023-01-23: qty 2

## 2023-01-23 MED ORDER — METHOCARBAMOL 500 MG PO TABS: 500.0000 mg | ORAL_TABLET | Freq: Two times a day (BID) | ORAL | 0 refills | Status: DC

## 2023-01-23 NOTE — ED Triage Notes (Addendum)
Pt reports R leg pain going from hip to knee. No injury reported.

## 2023-01-23 NOTE — ED Provider Notes (Signed)
[place] [facility] AT [facility] Provider Note   CSN: [id number] Arrival date & time: 01/23/23  0201     History  Chief Complaint  Patient presents with   Leg Pain    Kyle Miller is a 47 y.o. male.   Leg Pain Lower extremity pain location: upper lateral R leg. Injury: no   Pain details:    Quality:  Shooting   Severity:  Severe   Onset quality:  Sudden   Timing:  Constant   Progression:  Unchanged Chronicity:  New Dislocation: no   Relieved by:  Nothing Worsened by:  Nothing Ineffective treatments:  None tried Associated symptoms: no fever, no neck pain and no swelling   Associated symptoms comment:  Had some low back pain that resolved       Home Medications Prior to Admission medications   Medication Sig Start Date End Date Taking? Authorizing Provider  lidocaine (LIDODERM) 5 % Place 1 patch onto the skin daily. Remove & Discard patch within 12 hours or as directed by MD 01/23/23  Yes Lavonda Thal, MD  meloxicam (MOBIC) 15 MG tablet Take 1 tablet (15 mg total) by mouth daily. 01/23/23  Yes Lucky Alverson, MD  methocarbamol (ROBAXIN) 500 MG tablet Take 1 tablet (500 mg total) by mouth 2 (two) times daily. 01/23/23  Yes Ciarrah Rae, MD  albuterol (VENTOLIN HFA) 108 (90 Base) MCG/ACT inhaler TAKE 2 PUFFS BY MOUTH EVERY 6 HOURS AS NEEDED FOR WHEEZE OR SHORTNESS OF BREATH 05/17/22   Saguier, Ramon Dredge, PA-C  azelastine (ASTELIN) 0.1 % nasal spray Place 2 sprays into both nostrils 2 (two) times daily. Use in each nostril as directed Patient taking differently: Place 1 spray into both nostrils as needed. 05/06/21   Saguier, Ramon Dredge, PA-C  citalopram (CELEXA) 10 MG tablet TAKE 1 TABLET BY MOUTH EVERY DAY 03/06/22   Saguier, Ramon Dredge, PA-C  fluticasone [facility]) 50 MCG/ACT nasal spray SPRAY 2 SPRAYS INTO EACH NOSTRIL EVERY DAY 03/07/22   Saguier, Ramon Dredge, PA-C  levocetirizine (XYZAL) 5 MG tablet Take 1 tablet (5 mg total) by mouth every evening. 02/09/22    Saguier, Ramon Dredge, PA-C  Melatonin 5 MG TABS Take 5 mg by mouth at bedtime as needed (Sleep). gummy    [provider]  pantoprazole (PROTONIX) 40 MG tablet TAKE 1 TABLET BY MOUTH EVERY DAY 09/27/22   Saguier, Ramon Dredge, PA-C  phenazopyridine (PYRIDIUM) 100 MG tablet Take 100 mg by mouth 3 (three) times daily.    [provider]  predniSONE (STERAPRED UNI-PAK 21 TAB) 10 MG (21) TBPK tablet Take following package directions 11/06/22   Waldon Merl, PA-C  traZODone (DESYREL) 50 MG tablet TAKE 1 TABLET BY MOUTH EVERYDAY AT BEDTIME 11/24/22   Saguier, Ramon Dredge, PA-C  Vitamin D, Ergocalciferol, (DRISDOL) 1.25 MG (50000 UNIT) CAPS capsule TAKE 1 CAPSULE (50,000 UNITS TOTAL) BY MOUTH EVERY 7 (SEVEN) DAYS 11/08/22   Saguier, Ramon Dredge, PA-C      Allergies    Hydrocodone    Review of Systems   Review of Systems  Constitutional:  Negative for fever.  Gastrointestinal:  Negative for vomiting.  Musculoskeletal:  Negative for neck pain.  Neurological:  Negative for weakness and numbness.  All other systems reviewed and are negative.   Physical Exam Updated Vital Signs BP 138/85 (BP Location: Right Arm)   Pulse 74   Temp 98 F (36.7 C) (Oral)   Resp 15   Ht 6\' 1"  (1.854 m)   Wt 98.4 kg  SpO2 99%   BMI 28.63 kg/m  Physical Exam Vitals and nursing note reviewed.  Constitutional:      General: He is not in acute distress.    Appearance: Normal appearance. He is well-developed. He is not diaphoretic.  HENT:     Head: Normocephalic and atraumatic.     Nose: Nose normal.  Eyes:     Conjunctiva/sclera: Conjunctivae normal.     Pupils: Pupils are equal, round, and reactive to light.  Cardiovascular:     Rate and Rhythm: Normal rate and regular rhythm.     Pulses: Normal pulses.     Heart sounds: Normal heart sounds.  Pulmonary:     Effort: Pulmonary effort is normal.     Breath sounds: Normal breath sounds. No wheezing or rales.  Abdominal:     General: Bowel sounds are normal.      Palpations: Abdomen is soft.     Tenderness: There is no abdominal tenderness. There is no guarding or rebound.  Musculoskeletal:        General: Normal range of motion.     Cervical back: Normal, normal range of motion and neck supple.     Thoracic back: Normal.     Lumbar back: Normal.     Right hip: Normal.     Right upper leg: Normal.     Right knee: Normal.  Skin:    General: Skin is warm and dry.     Capillary Refill: Capillary refill takes less than 2 seconds.  Neurological:     General: No focal deficit present.     Mental Status: He is alert and oriented to person, place, and time.     Deep Tendon Reflexes: Reflexes normal.  Psychiatric:        Mood and Affect: Mood normal.        Behavior: Behavior normal.     ED Results / Procedures / Treatments   Labs (all labs ordered are listed, but only abnormal results are displayed) Labs Reviewed - No data to display  EKG None  Radiology No results found.  Procedures Procedures    Medications Ordered in ED Medications  lidocaine (LIDODERM) 5 % 3 patch (3 patches Transdermal Patch Applied 01/23/23 0342)  ketorolac (TORADOL) injection 60 mg (60 mg Intramuscular Given 01/23/23 0341)  methocarbamol (ROBAXIN) tablet 500 mg (500 mg Oral Given 01/23/23 0341)    ED Course/ Medical Decision Making/ A&P                                 Medical Decision Making Patient with lateral upper leg pain that awoke the patient.  No trauma   Amount and/or Complexity of Data Reviewed Independent Historian: spouse    Details: See above  External Data Reviewed: notes.    Details: Previous notes reviewed   Risk Prescription drug management. Risk Details: Symptoms are consistent with sciatica.  Without trauma, no indication for imaging.  Will start medication and have patient follow up with his spine surgeon for ongoing care.  Stable for discharge.      Final Clinical Impression(s) / ED Diagnoses Final diagnoses:  Sciatica of  right side   Return for intractable cough, coughing up blood, fevers > 100.4 unrelieved by medication, shortness of breath, intractable vomiting, chest pain, shortness of breath, weakness, numbness, changes in speech, facial asymmetry, abdominal pain, passing out, Inability to tolerate liquids or food, cough, altered mental status or any  concerns. No signs of systemic illness or infection. The patient is nontoxic-appearing on exam and vital signs are within normal limits.  I have reviewed the triage vital signs and the nursing notes. Pertinent labs & imaging results that were available during my care of the patient were reviewed by me and considered in my medical decision making (see chart for details). After history, exam, and medical workup I feel the patient has been appropriately medically screened and is safe for discharge home. Pertinent diagnoses were discussed with the patient. Patient was given return precautions.  Rx / DC Orders ED Discharge Orders          Ordered    methocarbamol (ROBAXIN) 500 MG tablet  2 times daily        01/23/23 0424    lidocaine (LIDODERM) 5 %  Every 24 hours        01/23/23 0424    meloxicam (MOBIC) 15 MG tablet  Daily        01/23/23 0424              Teagen Mcleary, MD 01/23/23 1610

## 2023-01-24 ENCOUNTER — Ambulatory Visit: Payer: BC Managed Care – PPO | Admitting: Internal Medicine

## 2023-01-24 ENCOUNTER — Encounter: Payer: Self-pay | Admitting: Internal Medicine

## 2023-01-24 VITALS — BP 132/76 | HR 76 | Temp 98.1°F | Resp 16 | Ht 73.0 in | Wt 216.0 lb

## 2023-01-24 DIAGNOSIS — M25551 Pain in right hip: Secondary | ICD-10-CM

## 2023-01-24 MED ORDER — PREDNISONE 10 MG PO TABS: ORAL_TABLET | ORAL | 0 refills | Status: DC

## 2023-01-24 NOTE — Patient Instructions (Addendum)
Please reach out to [facility], [phone]  We have put referral to see the general orthopedist.  Let us know if they are not able to see you soon.  Prednisone for few days as prescribed  Combined Tylenol and ibuprofen for pain control.  Call immediately if you have fever or chills.  Call if you have a rash anywhere around your hip, stomach, back.  === Tylenol  500 mg OTC 2 tabs a day every 8 hours as needed for pain  IBUPROFEN (Advil or Motrin) 200 mg 2 tablets every 6 hours as needed for pain.  Always take it with food because may cause gastritis and ulcers.  If you notice nausea, stomach pain, change in the color of stools --->  Stop the medicine and let us know

## 2023-01-24 NOTE — Progress Notes (Signed)
Subjective:    Patient ID: Kyle Miller, male    DOB: Mar 09, 1976, 47 y.o.   MRN: [id number]  DOS:  01/24/2023 Type of visit - description: acute  Was recently diagnosed with COVID, tested + 01/15/2023.  Did not require antivirals  He recuperated well and went back to work Monday, 01/22/2023. The next day, was awake by intense pain at the right hip, anterior aspect, with radiation to the right side. The pain is steady, much worse after he stands or walks for few minutes. The only position that helped to some extent used laying down and turning to the right.  No fever or chills in the last few days. No low back pain.  No rash. History of L4-L5 problems 2 years ago but never had surgery. No hernia or groin bulge noted.   Review of Systems See above   Past Medical History:  Diagnosis Date   Asthma    Bulging lumbar disc    Depression    GERD (gastroesophageal reflux disease)    History of chicken pox    as child   History of kidney stones    Severe Sepsis (HCC) 07/13/2021   admitted x 2 days    Past Surgical History:  Procedure Laterality Date   CYSTOSCOPY WITH RETROGRADE PYELOGRAM, URETEROSCOPY AND STENT PLACEMENT Left 07/13/2021   Procedure: CYSTOSCOPY WITH RETROGRADE PYELOGRAM, AND STENT PLACEMENT;  Surgeon: Alfredo Martinez, MD;  Location: [facility];  Service: Urology;  Laterality: Left;   CYSTOSCOPY/URETEROSCOPY/HOLMIUM LASER/STENT PLACEMENT Left 07/25/2021   Procedure: CYSTOSCOPY/RETROGRADE/URETEROSCOPY/HOLMIUM LASER/STENT EXCHANGED;  Surgeon: Jannifer Hick, MD;  Location: [facility];  Service: Urology;  Laterality: Left;   EXTRACORPOREAL SHOCK WAVE LITHOTRIPSY Left 07/07/2021   Procedure: EXTRACORPOREAL SHOCK WAVE LITHOTRIPSY (ESWL);  Surgeon: Marcine Matar, MD;  Location: [facility];  Service: Urology;  Laterality: Left;   WISDOM TOOTH EXTRACTION     as child    Current Outpatient Medications  Medication Instructions    albuterol (VENTOLIN HFA) 108 (90 Base) MCG/ACT inhaler TAKE 2 PUFFS BY MOUTH EVERY 6 HOURS AS NEEDED FOR WHEEZE OR SHORTNESS OF BREATH   azelastine (ASTELIN) 0.1 % nasal spray 2 sprays, Each Nare, 2 times daily, Use in each nostril as directed   citalopram (CELEXA) 10 mg, Oral, Daily   fluticasone (FLONASE) 50 MCG/ACT nasal spray SPRAY 2 SPRAYS INTO EACH NOSTRIL EVERY DAY   levocetirizine (XYZAL) 5 mg, Oral, Every evening   lidocaine (LIDODERM) 5 % 1 patch, Transdermal, Every 24 hours, Remove & Discard patch within 12 hours or as directed by MD   melatonin 5 mg, Oral, At bedtime PRN, gummy   meloxicam (MOBIC) 15 mg, Oral, Daily   methocarbamol (ROBAXIN) 500 mg, Oral, 2 times daily   pantoprazole (PROTONIX) 40 mg, Oral, Daily   phenazopyridine (PYRIDIUM) 100 mg, Oral, 3 times daily   predniSONE (STERAPRED UNI-PAK 21 TAB) 10 MG (21) TBPK tablet Take following package directions   traZODone (DESYREL) 50 MG tablet TAKE 1 TABLET BY MOUTH EVERYDAY AT BEDTIME   Vitamin D (Ergocalciferol) (DRISDOL) 50,000 Units, Oral, Every 7 days       Objective:   Physical Exam Skin:         Comments: Pain location, see graphic  BP 132/76   Pulse 76   Temp 98.1 F (36.7 C) (Oral)   Resp 16   Ht 6\' 1"  (1.854 m)   Wt 216 lb (98 kg)   SpO2 97%   BMI 28.50 kg/m  General:   Well developed, NAD, BMI noted. + Antalgic gait and posture noted.  Pain triggered by simply walk few steps.  Limping HEENT:  Normocephalic . Face symmetric, atraumatic Right groin: No hernia, no LAD. Lower extremities: no pretibial edema bilaterally.  Hip rotation is full bilaterally however pain is triggered by  R hip rotation. Pinprick examination of the thighs-- Essentially normal. Skin: I see a couple of excoriations at the lateral right hip, patient said they were not blisters rather from a heating pad. Neurologic:  alert & oriented X3.  Speech normal, gait appropriate for age and unassisted Motor and DTR symmetric Psych--   Cognition and judgment appear intact.  Cooperative with normal attention span and concentration.  Behavior appropriate. No anxious or depressed appearing.      Assessment   47 year old male, history of urolithiasis, anxiety, asthma, presents with  Acute right hip pain. The pain seems to be generated by the hip noting he has no fever or chills. The pain location coincides w/ what could be meralgia paresthetica but again pain seems to be more hip related. Doubt radiculopathy. He recently had COVID, unclear if there is any relationship. Plan: Refer to [facility], he has been seen there before. Round of prednisone, judicious use of ibuprofen and Tylenol. Call immediately if fever chills or a rash (had a couple of excoriations around the hip but he said this is related to a heating pad).

## 2023-01-31 ENCOUNTER — Ambulatory Visit: Payer: BC Managed Care – PPO | Admitting: Medical

## 2023-02-13 ENCOUNTER — Encounter: Payer: Self-pay | Admitting: Medical

## 2023-02-13 ENCOUNTER — Ambulatory Visit: Payer: BC Managed Care – PPO | Admitting: Medical

## 2023-02-13 VITALS — BP 155/80 | HR 69 | Temp 97.8°F | Resp 16 | Wt 209.8 lb

## 2023-02-13 DIAGNOSIS — R300 Dysuria: Secondary | ICD-10-CM | POA: Diagnosis not present

## 2023-02-13 DIAGNOSIS — M5126 Other intervertebral disc displacement, lumbar region: Secondary | ICD-10-CM | POA: Diagnosis not present

## 2023-02-13 DIAGNOSIS — I10 Essential (primary) hypertension: Secondary | ICD-10-CM | POA: Diagnosis not present

## 2023-02-13 DIAGNOSIS — R3912 Poor urinary stream: Secondary | ICD-10-CM | POA: Diagnosis not present

## 2023-02-13 DIAGNOSIS — Z125 Encounter for screening for malignant neoplasm of prostate: Secondary | ICD-10-CM

## 2023-02-13 DIAGNOSIS — R350 Frequency of micturition: Secondary | ICD-10-CM

## 2023-02-13 LAB — POCT URINALYSIS DIPSTICK
Bilirubin, UA: NEGATIVE
Blood, UA: NEGATIVE
Glucose, UA: NEGATIVE
Leukocytes, UA: NEGATIVE
Nitrite, UA: NEGATIVE
Protein, UA: NEGATIVE
Spec Grav, UA: 1.01 (ref 1.010–1.025)
Urobilinogen, UA: 0.2 U/dL
pH, UA: 6.5 (ref 5.0–8.0)

## 2023-02-13 MED ORDER — DIAZEPAM 5 MG PO TABS: ORAL_TABLET | ORAL | 0 refills | Status: DC

## 2023-02-13 NOTE — Progress Notes (Signed)
Subjective:    Patient ID: Kyle Miller, male    DOB: 1976-05-16, 47 y.o.   MRN: [id number]  HPI Discussed the use of AI scribe software for clinical note transcription with the patient, who gave verbal consent to proceed.  History of Present Illness   The patient, with a history of a slightly herniated disc at L2, L3, presented with severe pain radiating down the right leg, stopping just before the knee. The pain, which started three weeks ago, was initially severe but has since mostly resolved. The patient was prescribed a regimen of medications, including prednisone, which was poorly tolerated and led to significant discomfort and tenseness.  Following the initiation of the prednisone, the patient began experiencing urinary problems, characterized by a weak stream and a constant need to urinate. The patient has been constantly checking the strength of his urine stream, which has been causing significant distress. Over the past two days, the patient has also experienced a burning sensation during urination. But can urinate.  The medication has been effective in controlling the pain and allowing the patient to sleep. The patient has a history of prostatitis, which was successfully treated with antibiotics seven to eight years ago.  The patient also reported muscular pain in the right lower back, which he attributes to trying to hold himself up to prevent the pain from the back radiating down the leg. The pain occasionally shoots randomly to the right leg, particularly after prolonged periods of walking.  The patient has been experiencing elevated blood pressure, which he believes may be due to the stress and worry caused by his urinary symptoms. The patient has a history of kidney stones, but the current pain is not consistent with that previous experience.       Review of Systems See hpi    Objective:   Physical Exam  General Appearance- Not in acute distress.    Chest and Lung  Exam Auscultation: Breath sounds:-Normal. Clear even and unlabored. Adventitious sounds:- No Adventitious sounds.  Cardiovascular Auscultation:Rythm - Regular, rate and rythm. Heart Sounds -Normal heart sounds.  Abdomen Inspection:-Inspection Normal.  Palpation/Perucssion: Palpation and Percussion of the abdomen reveal- Non Tender, No Rebound tenderness, No rigidity(Guarding) and No Palpable abdominal masses.  Liver:-Normal.  Spleen:- Normal.   Back Pain more rt sided para lumbar spine tenderness to palpation. Pain on straight leg lift. Pain on lateral movements and flexion/extension of the spine. -no cva tenderness.   Lower ext neurologic  L5-S1 sensation intact bilaterally. Normal patellar reflexes bilaterally. No foot drop bilaterally.       Assessment & Plan:   Assessment and Plan    Patient Instructions  Urinary Symptoms New onset of urinary frequency, hesitancy, and dysuria. No history of similar symptoms. No fever, chills, or sweats. No perineal pain or numbness. Urinalysis did not show obvious signs of infection. -Send urine for culture. -Order PSA. -If PSA is elevated, start antibiotics. -later tonight if difficulty urinating can try tamsuloin  Lumbar Disc Herniation Pain radiating down the right leg from the lower back. MRI confirmed slight herniation at L2-L3. Neurosurgeon recommended conservative treatment. Pain controlled with Gabapentin 300mg  at night. -Continue Gabapentin 300mg  at night. -If symptoms worsen (severe pain, leg weakness, foot drop, numbness between legs), let me know and would try to contact Dr. Danielle Dess for potential repeat MRI.  Hypertension Possible elevated blood pressure, potentially related to stress from current symptoms. -Check blood pressure at home. -Confirm current medication (Losartan 25mg ). -If blood pressure remains elevated, increase  Losartan to 50mg . please udate me on bp reading tomorrow morning and dose. -Order metabolic  panel to assess kidney function.  Anxiety dealing with recent medical problems. - will rx shot course of valium 5 mg. 1  tab po daily prn anxiety. continue citalopram   Follow up in 10 days or sooner if needed.       Time spent with patient today was 50  minutes which consisted of chart review, discussing diagnosis, work up treatment and documentation.

## 2023-02-13 NOTE — Patient Instructions (Addendum)
Urinary Symptoms New onset of urinary frequency, hesitancy, and dysuria. No history of similar symptoms. No fever, chills, or sweats. No perineal pain or numbness. Urinalysis did not show obvious signs of infection. -Send urine for culture. -Order PSA. -If PSA is elevated, start antibiotics. -later tonight if difficulty urinating can try tamsuloin  Lumbar Disc Herniation Pain radiating down the right leg from the lower back. MRI confirmed slight herniation at L2-L3. Neurosurgeon recommended conservative treatment. Pain controlled with Gabapentin 300mg  at night. -Continue Gabapentin 300mg  at night. -If symptoms worsen (severe pain, leg weakness, foot drop, numbness between legs), let me know and would try to contact Dr. Danielle Dess for potential repeat MRI.  Hypertension Possible elevated blood pressure, potentially related to stress from current symptoms. -Check blood pressure at home. -Confirm current medication (Losartan 25mg ). -If blood pressure remains elevated, increase Losartan to 50mg . please udate me on bp reading tomorrow morning and dose. -Order metabolic panel to assess kidney function.  Anxiety dealing with recent medical problems. - will rx shot course of valium 5 mg. 1  tab po daily prn anxiety. continue citalopram   Follow up in 10 days or sooner if needed.

## 2023-02-14 LAB — COMPREHENSIVE METABOLIC PANEL
ALT: 18 U/L (ref 0–53)
AST: 13 U/L (ref 0–37)
Albumin: 4.6 g/dL (ref 3.5–5.2)
Alkaline Phosphatase: 57 U/L (ref 39–117)
BUN: 11 mg/dL (ref 6–23)
CO2: 29 meq/L (ref 19–32)
Calcium: 9.9 mg/dL (ref 8.4–10.5)
Chloride: 101 meq/L (ref 96–112)
Creatinine, Ser: 0.99 mg/dL (ref 0.40–1.50)
GFR: 89.95 mL/min (ref >60.00)
Glucose, Bld: 80 mg/dL (ref 70–99)
Potassium: 4.5 meq/L (ref 3.5–5.1)
Sodium: 139 meq/L (ref 135–145)
Total Bilirubin: 0.5 mg/dL (ref 0.2–1.2)
Total Protein: 7.1 g/dL (ref 6.0–8.3)

## 2023-02-14 LAB — URINE CULTURE
MICRO NUMBER:: 15445970
Result:: NO GROWTH
SPECIMEN QUALITY:: ADEQUATE

## 2023-02-14 LAB — CBC WITH DIFFERENTIAL/PLATELET
Basophils Absolute: 0.1 10*3/uL (ref 0.0–0.1)
Basophils Relative: 0.8 % (ref 0.0–3.0)
Eosinophils Absolute: 0.4 10*3/uL (ref 0.0–0.7)
Eosinophils Relative: 4.1 % (ref 0.0–5.0)
HCT: 45.3 % (ref 39.0–52.0)
Hemoglobin: 14.6 g/dL (ref 13.0–17.0)
Lymphocytes Relative: 29.4 % (ref 12.0–46.0)
Lymphs Abs: 2.8 10*3/uL (ref 0.7–4.0)
MCHC: 32.3 g/dL (ref 30.0–36.0)
MCV: 91.5 fl (ref 78.0–100.0)
Monocytes Absolute: 0.8 10*3/uL (ref 0.1–1.0)
Monocytes Relative: 8.4 % (ref 3.0–12.0)
Neutro Abs: 5.4 10*3/uL (ref 1.4–7.7)
Neutrophils Relative %: 57.3 % (ref 43.0–77.0)
Platelets: 273 10*3/uL (ref 150.0–400.0)
RBC: 4.95 Mil/uL (ref 4.22–5.81)
RDW: 13.5 % (ref 11.5–15.5)
WBC: 9.5 10*3/uL (ref 4.0–10.5)

## 2023-02-14 LAB — PSA: PSA: 0.8 ng/mL (ref 0.10–4.00)

## 2023-02-16 MED ORDER — TAMSULOSIN HCL 0.4 MG PO CAPS: 0.4000 mg | ORAL_CAPSULE | Freq: Every day | ORAL | 0 refills | Status: DC

## 2023-02-16 NOTE — Addendum Note (Signed)
Addended by: Gwenevere Abbot on: 02/16/2023 05:52 PM   Modules accepted: Orders

## 2023-02-16 NOTE — Addendum Note (Signed)
Addended by: Gwenevere Abbot on: 02/16/2023 06:05 PM   Modules accepted: Orders

## 2023-02-18 ENCOUNTER — Other Ambulatory Visit: Payer: Self-pay | Admitting: Medical

## 2023-03-13 ENCOUNTER — Other Ambulatory Visit: Payer: Self-pay | Admitting: Medical

## 2023-03-15 ENCOUNTER — Other Ambulatory Visit: Payer: Self-pay | Admitting: Medical

## 2023-03-30 ENCOUNTER — Other Ambulatory Visit: Payer: Self-pay | Admitting: Medical

## 2023-05-13 ENCOUNTER — Other Ambulatory Visit: Payer: Self-pay | Admitting: Medical

## 2023-05-22 ENCOUNTER — Other Ambulatory Visit: Payer: Self-pay | Admitting: Medical

## 2023-08-16 ENCOUNTER — Encounter: Payer: Self-pay | Admitting: *Deleted

## 2023-08-16 ENCOUNTER — Other Ambulatory Visit: Payer: Self-pay | Admitting: Medical

## 2023-10-02 ENCOUNTER — Other Ambulatory Visit: Payer: Self-pay | Admitting: Medical

## 2023-10-25 ENCOUNTER — Telehealth: Admitting: Nurse Practitioner

## 2023-10-25 DIAGNOSIS — B9689 Other specified bacterial agents as the cause of diseases classified elsewhere: Secondary | ICD-10-CM | POA: Diagnosis not present

## 2023-10-25 DIAGNOSIS — J329 Chronic sinusitis, unspecified: Secondary | ICD-10-CM

## 2023-10-25 MED ORDER — AMOXICILLIN-POT CLAVULANATE 875-125 MG PO TABS
1.0000 | ORAL_TABLET | Freq: Two times a day (BID) | ORAL | 0 refills | Status: AC
Start: 2023-10-25 — End: 2023-11-01

## 2023-10-25 NOTE — Progress Notes (Signed)

## 2023-10-25 NOTE — Progress Notes (Signed)
 I have spent 5 minutes in review of e-visit questionnaire, review and updating patient chart, medical decision making and response to patient.   Claiborne Rigg, NP

## 2023-11-07 ENCOUNTER — Ambulatory Visit: Admitting: Medical

## 2023-11-08 ENCOUNTER — Ambulatory Visit: Admitting: Medical

## 2023-11-08 VITALS — BP 135/86 | HR 70 | Temp 97.8°F | Resp 16 | Ht 73.0 in | Wt 224.0 lb

## 2023-11-08 DIAGNOSIS — F411 Generalized anxiety disorder: Secondary | ICD-10-CM | POA: Diagnosis not present

## 2023-11-08 DIAGNOSIS — I10 Essential (primary) hypertension: Secondary | ICD-10-CM

## 2023-11-08 DIAGNOSIS — M2550 Pain in unspecified joint: Secondary | ICD-10-CM

## 2023-11-08 DIAGNOSIS — R5383 Other fatigue: Secondary | ICD-10-CM

## 2023-11-08 DIAGNOSIS — M79644 Pain in right finger(s): Secondary | ICD-10-CM | POA: Diagnosis not present

## 2023-11-08 MED ORDER — BUSPIRONE HCL 7.5 MG PO TABS: 7.5000 mg | ORAL_TABLET | Freq: Two times a day (BID) | ORAL | 0 refills | Status: DC

## 2023-11-08 MED ORDER — LISINOPRIL 5 MG PO TABS: 5.0000 mg | ORAL_TABLET | Freq: Every day | ORAL | 0 refills | Status: DC

## 2023-11-08 NOTE — Progress Notes (Signed)
 Subjective:    Patient ID: Kyle N Caserta, male    DOB: 12-20-1975, 48 y.o.   MRN: [id number]  HPI  Kyle Miller is a 48 year old male with hypertension who presents with concerns about blood pressure management and medication side effects.  He experiences fluctuations in blood pressure related to his use of losartan  25 mg daily. When taking the medication consistently for one to two weeks, his blood pressure drops to around 100/70 mmHg, leading to lethargy and low energy. Discontinuing the medication results in an increase in blood pressure to approximately 155/88 or 90 mmHg, accompanied by vision changes. He has been taking losartan  consistently for the past three days.  He has been experiencing pain and limited range of motion in his right index finger for almost three months. The pain is localized to the inside of the finger and is described as shooting when pressure is applied. Initial swelling has subsided. He recalls a similar issue with his thumb in the past, which resolved with a corticosteroid injection.  He experiences persistent anxiety, irritability, and difficulty concentrating, ongoing for the past two months. He is currently on Celexa  (citalopram ) but has been taking it sporadically, though consistently for the past week. He reports a lack of sex drive for the past three to four months, which he associates with his mood changes. He recalls being prescribed Valium  in the past for anxiety but does not remember if he took it. No current use of benzodiazepines.        Review of Systems  Constitutional:  Negative for chills and fever.  HENT:  Negative for congestion, ear discharge and ear pain.   Respiratory:  Negative for cough, chest tightness and wheezing.   Cardiovascular:  Negative for chest pain and palpitations.  Gastrointestinal:  Negative for abdominal pain, blood in stool, nausea and vomiting.  Musculoskeletal:  Negative for back pain, myalgias and neck stiffness.   Skin:  Negative for rash.  Neurological:  Negative for seizures and facial asymmetry.  Hematological:  Negative for adenopathy. Does not bruise/bleed easily.  Psychiatric/Behavioral:  Positive for dysphoric mood. Negative for suicidal ideas. The patient is nervous/anxious.     Past Medical History:  Diagnosis Date   Asthma    Bulging lumbar disc    Depression    GERD (gastroesophageal reflux disease)    History of chicken pox    as child   History of kidney stones    Severe Sepsis (HCC) 07/13/2021   admitted x 2 days     Social History   Socioeconomic History   Marital status: Married    Spouse name: Not on file   Number of children: Not on file   Years of education: Not on file   Highest education level: Bachelor's degree (e.g., BA, AB, BS)  Occupational History   Not on file  Tobacco Use   Smoking status: Never   Smokeless tobacco: Never  Vaping Use   Vaping status: Never Used  Substance and Sexual Activity   Alcohol use: No    Alcohol/week: 0.0 standard drinks of alcohol   Drug use: No   Sexual activity: Yes    Partners: Female  Other Topics Concern   Not on file  Social History Narrative   He live with wife and youngest daughter.   He works as a Therapist, occupational.   Highest level of education:  College, BS   Social Drivers of Health   Financial Resource Strain: Low Risk  (11/07/2023)  Overall Financial Resource Strain (CARDIA)    Difficulty of Paying Living Expenses: Not hard at all  Food Insecurity: No Food Insecurity (11/07/2023)   Hunger Vital Sign    Worried About Running Out of Food in the Last Year: Never true    Ran Out of Food in the Last Year: Never true  Transportation Needs: No Transportation Needs (11/07/2023)   PRAPARE - Administrator, Civil Service (Medical): No    Lack of Transportation (Non-Medical): No  Physical Activity: Insufficiently Active (11/07/2023)   Exercise Vital Sign    Days of Exercise per Week: 3 days    Minutes of  Exercise per Session: 20 min  Stress: Stress Concern Present (11/07/2023)   [employer] of Occupational Health - Occupational Stress Questionnaire    Feeling of Stress : Very much  Social Connections: Socially Integrated (11/07/2023)   Social Connection and Isolation Panel [NHANES]    Frequency of Communication with Friends and Family: More than three times a week    Frequency of Social Gatherings with Friends and Family: Once a week    Attends Religious Services: More than 4 times per year    Active Member of [employer] or Organizations: Yes    Attends Engineer, structural: More than 4 times per year    Marital Status: Married  Catering manager Violence: Not on file    Past Surgical History:  Procedure Laterality Date   CYSTOSCOPY WITH RETROGRADE PYELOGRAM, URETEROSCOPY AND STENT PLACEMENT Left 07/13/2021   Procedure: CYSTOSCOPY WITH RETROGRADE PYELOGRAM, AND STENT PLACEMENT;  Surgeon: Erman Hayward, MD;  Location: [facility];  Service: Urology;  Laterality: Left;   CYSTOSCOPY/URETEROSCOPY/HOLMIUM LASER/STENT PLACEMENT Left 07/25/2021   Procedure: CYSTOSCOPY/RETROGRADE/URETEROSCOPY/HOLMIUM LASER/STENT EXCHANGED;  Surgeon: Lahoma Pigg, MD;  Location[facility];  Service: Urology;  Laterality: Left;   EXTRACORPOREAL SHOCK WAVE LITHOTRIPSY Left 07/07/2021   Procedure: EXTRACORPOREAL SHOCK WAVE LITHOTRIPSY (ESWL);  Surgeon: Trent Frizzle, MD;  Location: [facility];  Service: Urology;  Laterality: Left;   WISDOM TOOTH EXTRACTION     as child    Family History  Problem Relation Age of Onset   Healthy Mother        Living   Healthy Father        Living   Thyroid  disease Brother    Asthma Daughter    Colon polyps Neg Hx    Colon cancer Neg Hx     Allergies  Allergen Reactions   Hydrocodone  Itching and Other (See Comments)    Hypersensitivity with whole pill    Current Outpatient Medications on File Prior to Visit  Medication Sig  Dispense Refill   citalopram  (CELEXA ) 10 MG tablet TAKE 1 TABLET BY MOUTH EVERY DAY 90 tablet 2   pantoprazole  (PROTONIX ) 40 MG tablet TAKE 1 TABLET BY MOUTH EVERY DAY 90 tablet 1   traZODone  (DESYREL ) 50 MG tablet TAKE 1 TABLET BY MOUTH EVERYDAY AT BEDTIME 90 tablet 0   Vitamin D , Ergocalciferol , (DRISDOL ) 1.25 MG (50000 UNIT) CAPS capsule TAKE 1 CAPSULE (50,000 UNITS TOTAL) BY MOUTH EVERY 7 (SEVEN) DAYS 12 capsule 3   No current facility-administered medications on file prior to visit.    BP 135/86 (BP Location: Left Arm, Patient Position: Sitting, Cuff Size: Large)   Pulse 70   Temp 97.8 F (36.6 C) (Oral)   Resp 16   Ht 6\' 1"  (1.854 m)   Wt 224 lb (101.6 kg)   SpO2 100%   BMI 29.55  kg/m        Objective:   Physical Exam  General Mental Status- Alert. General Appearance- Not in acute distress.   Skin General: Color- Normal Color. Moisture- Normal Moisture.  Neck No JVD.  Chest and Lung Exam Auscultation: Breath Sounds:-Normal.  Cardiovascular Auscultation:Rythm- Regular. Murmurs & Other Heart Sounds:Auscultation of the heart reveals- No Murmurs.  Abdomen Inspection:-Inspeection Normal. Palpation/Percussion:Note:No mass. Palpation and Percussion of the abdomen reveal- Non Tender, Non Distended + BS, no rebound or guarding.   Neurologic Cranial Nerve exam:- CN III-XII intact(No nystagmus), symmetric smile. Strength:- 5/5 equal and symmetric strength both upper and lower extremities.   Rt hand- rt 2nd digit pip joint mild tender. Not swollen. Pt can't fully flex this digit. No obvious arthritic changes.    Assessment & Plan:   Patient Instructions  Hypertension  Blood pressure drops excessively on losartan , causing symptoms. Lisinopril considered as alternative due to lower starting dose and similar efficacy. - Discontinue losartan . - Start lisinopril 5 mg daily. - Monitor blood pressure daily. - Send blood pressure update in 10 days. - Consider  increasing lisinopril to 10 mg if blood pressure remains high.  Anxiety Persistent anxiety with mild depression. Inconsistent citalopram  use.  Discussed potential addition of Buspar if anxiety persists. - Encourage consistent daily use of citalopram . - Consider adding Buspar if anxiety persists after consistent use of citalopram  for two weeks. - Prescribe Buspar to have on hand if needed.  Right Index Finger Pain Chronic right index finger pain with limited range of motion and swelling. Differential includes osteoarthritis or tendon issue. - Order x-ray of right index finger. - Consider referral to hand specialist if x-ray is negative and symptoms persist.  Follow up in 5 weeks or sooner if needed   Algie Westry, PA-C

## 2023-11-08 NOTE — Patient Instructions (Signed)
 Hypertension  Blood pressure drops excessively on losartan , causing symptoms. Lisinopril considered as alternative due to lower starting dose and similar efficacy. - Discontinue losartan . - Start lisinopril 5 mg daily. - Monitor blood pressure daily. - Send blood pressure update in 10 days. - Consider increasing lisinopril to 10 mg if blood pressure remains high.  Anxiety Persistent anxiety with mild depression. Inconsistent citalopram  use.  Discussed potential addition of Buspar if anxiety persists. - Encourage consistent daily use of citalopram . - Consider adding Buspar if anxiety persists after consistent use of citalopram  for two weeks. - Prescribe Buspar to have on hand if needed.  Right Index Finger Pain Chronic right index finger pain with limited range of motion and swelling. Differential includes osteoarthritis or tendon issue. - Order x-ray of right index finger. - Consider referral to hand specialist if x-ray is negative and symptoms persist.  Follow up in 5 weeks or sooner if needed

## 2023-11-23 ENCOUNTER — Other Ambulatory Visit: Payer: Self-pay | Admitting: Medical

## 2023-11-30 ENCOUNTER — Other Ambulatory Visit: Payer: Self-pay | Admitting: Medical

## 2023-12-16 ENCOUNTER — Other Ambulatory Visit: Payer: Self-pay | Admitting: Medical

## 2024-01-10 ENCOUNTER — Telehealth: Payer: Self-pay

## 2024-01-10 NOTE — Telephone Encounter (Signed)
 Left voicemail to call office back to see if referral was completed.   Kyle Miller, CMA

## 2024-01-23 ENCOUNTER — Encounter: Payer: Self-pay | Admitting: Medical

## 2024-01-23 ENCOUNTER — Ambulatory Visit: Admitting: Medical

## 2024-01-23 ENCOUNTER — Ambulatory Visit: Payer: Self-pay | Admitting: Medical

## 2024-01-23 VITALS — BP 120/82 | HR 67 | Temp 98.7°F | Resp 16 | Ht 73.0 in | Wt 221.8 lb

## 2024-01-23 DIAGNOSIS — L299 Pruritus, unspecified: Secondary | ICD-10-CM

## 2024-01-23 DIAGNOSIS — J342 Deviated nasal septum: Secondary | ICD-10-CM

## 2024-01-23 DIAGNOSIS — H6993 Unspecified Eustachian tube disorder, bilateral: Secondary | ICD-10-CM

## 2024-01-23 DIAGNOSIS — Z125 Encounter for screening for malignant neoplasm of prostate: Secondary | ICD-10-CM

## 2024-01-23 DIAGNOSIS — R6882 Decreased libido: Secondary | ICD-10-CM

## 2024-01-23 DIAGNOSIS — Z0000 Encounter for general adult medical examination without abnormal findings: Secondary | ICD-10-CM

## 2024-01-23 DIAGNOSIS — E785 Hyperlipidemia, unspecified: Secondary | ICD-10-CM

## 2024-01-23 DIAGNOSIS — J309 Allergic rhinitis, unspecified: Secondary | ICD-10-CM

## 2024-01-23 DIAGNOSIS — Z1283 Encounter for screening for malignant neoplasm of skin: Secondary | ICD-10-CM

## 2024-01-23 DIAGNOSIS — L989 Disorder of the skin and subcutaneous tissue, unspecified: Secondary | ICD-10-CM

## 2024-01-23 DIAGNOSIS — Z0001 Encounter for general adult medical examination with abnormal findings: Secondary | ICD-10-CM | POA: Diagnosis not present

## 2024-01-23 LAB — CBC WITH DIFFERENTIAL/PLATELET
Basophils Absolute: 0 K/uL (ref 0.0–0.1)
Basophils Relative: 0.5 % (ref 0.0–3.0)
Eosinophils Absolute: 0.1 K/uL (ref 0.0–0.7)
Eosinophils Relative: 1.4 % (ref 0.0–5.0)
HCT: 46.9 % (ref 39.0–52.0)
Hemoglobin: 15.3 g/dL (ref 13.0–17.0)
Lymphocytes Relative: 35.1 % (ref 12.0–46.0)
Lymphs Abs: 2.5 K/uL (ref 0.7–4.0)
MCHC: 32.6 g/dL (ref 30.0–36.0)
MCV: 91.3 fl (ref 78.0–100.0)
Monocytes Absolute: 0.6 K/uL (ref 0.1–1.0)
Monocytes Relative: 8.6 % (ref 3.0–12.0)
Neutro Abs: 3.9 K/uL (ref 1.4–7.7)
Neutrophils Relative %: 54.4 % (ref 43.0–77.0)
Platelets: 266 K/uL (ref 150.0–400.0)
RBC: 5.14 Mil/uL (ref 4.22–5.81)
RDW: 13.4 % (ref 11.5–15.5)
WBC: 7.1 K/uL (ref 4.0–10.5)

## 2024-01-23 LAB — LIPID PANEL
Cholesterol: 256 mg/dL — ABNORMAL HIGH (ref 0–200)
HDL: 39.3 mg/dL (ref >39.00)
LDL Cholesterol: 190 mg/dL — ABNORMAL HIGH (ref 0–99)
NonHDL: 216.84
Total CHOL/HDL Ratio: 7
Triglycerides: 132 mg/dL (ref 0.0–149.0)
VLDL: 26.4 mg/dL (ref 0.0–40.0)

## 2024-01-23 LAB — COMPREHENSIVE METABOLIC PANEL WITH GFR
ALT: 19 U/L (ref 0–53)
AST: 16 U/L (ref 0–37)
Albumin: 5 g/dL (ref 3.5–5.2)
Alkaline Phosphatase: 61 U/L (ref 39–117)
BUN: 11 mg/dL (ref 6–23)
CO2: 29 meq/L (ref 19–32)
Calcium: 9.6 mg/dL (ref 8.4–10.5)
Chloride: 100 meq/L (ref 96–112)
Creatinine, Ser: 0.95 mg/dL (ref 0.40–1.50)
GFR: 93.89 mL/min (ref >60.00)
Glucose, Bld: 81 mg/dL (ref 70–99)
Potassium: 5 meq/L (ref 3.5–5.1)
Sodium: 137 meq/L (ref 135–145)
Total Bilirubin: 0.8 mg/dL (ref 0.2–1.2)
Total Protein: 7.6 g/dL (ref 6.0–8.3)

## 2024-01-23 LAB — PSA: PSA: 0.97 ng/mL (ref 0.10–4.00)

## 2024-01-23 MED ORDER — NEOMYCIN-POLYMYXIN-HC 3.5-10000-1 OT SOLN: 3.0000 [drp] | Freq: Three times a day (TID) | OTIC | 0 refills | Status: AC

## 2024-01-23 NOTE — Progress Notes (Signed)
 Subjective:    Patient ID: Kyle Miller, male    DOB: 1975-08-25, 48 y.o.   MRN: [id number]  HPI   Pt in for wellness exam  Pt is fasting today. Also notes some intermittent fasting. He feels mor mental clarity.   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 One meal a day. No caffeine. No alcohol. Non smoker.   Flu vaccine recommended sept-October. He states Does not get.  Declines hep b vaccine or hep b surface antibody testing.  Hep c screening declined.  Pt decided he will get pcv 20 vaccine when 48 years of age.   Pt has low libido. Energy good but states no desire for sex. Good energy level. No fatigue.  Kyle Miller is a 48 year old male who presents with right index finger pain and limited range of motion.  He experiences pain and limited range of motion in his right index finger, particularly at the knuckle. Swelling has decreased but remains noticeable, especially when bending the finger. 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 There is improvement in range of motion since the last visit, although pain persists when bending the finger significantly. He never got xray that I ordered.  He has experienced low libido for the past four to six months. No general fatigue is noted, but there is concern about the lack of sexual desire. He is currently on Buspar  for anxiety, which he takes daily, and recalls being prescribed   Celexa  in the past, but is unsure about his current use of Celexa  or whether it is taken as needed(he notes when used made him extremely drows(He had thought celexa  was prn med)?   He recalls being prescribed clonazepam  in the past for anxiety, which was used as needed. No longer needing and states buspar  adequate.  He reports a fluid sensation in both ears when lying down, a recent development, while itching inside the ears has been ongoing  for a long time. The itching is persistent, and there is decreased hearing in one ear compared to the other. He has a history of using Xyzal  and a nasal spray for allergies, which he takes as needed.  He is currently taking Protonix  for heartburn and confirms ongoing use.   Review of Systems  Constitutional:  Negative for chills.  HENT:  Negative for congestion.        Ear itching and pressure. See hpi.  Respiratory:  Negative for cough, chest tightness and wheezing.   Cardiovascular:  Negative for chest pain and palpitations.  Gastrointestinal:  Negative for abdominal pain, constipation and nausea.  Genitourinary:  Negative for dysuria and flank pain.       Low libido.  Musculoskeletal:  Negative for back pain.  Skin:  Negative for rash.  Neurological:  Negative for dizziness and light-headedness.  Hematological:  Negative for adenopathy. Does not bruise/bleed easily.  Psychiatric/Behavioral:  Negative for behavioral problems and decreased concentration.    Past Medical History:  Diagnosis Date   Asthma    Bulging lumbar disc    Depression    GERD (gastroesophageal reflux disease)    History of chicken pox    as child   History of kidney stones    Severe Sepsis (HCC) 07/13/2021   admitted x 2 days     Social History   Socioeconomic History   Marital status:  Married    Spouse name: Not on file   Number of children: Not on file   Years of education: Not on file   Highest education level: Bachelor's degree (e.g., BA, AB, BS)  Occupational History   Not on file  Tobacco Use   Smoking status: Never   Smokeless tobacco: Never  Vaping Use   Vaping status: Never Used  Substance and Sexual Activity   Alcohol use: No    Alcohol/week: 0.0 standard drinks of alcohol   Drug use: No   Sexual activity: Yes    Partners: Female  Other Topics Concern   Not on file  Social History Narrative   He live with wife and youngest daughter.   He works as a Therapist, occupational.   Highest level  of education:  College, BS   Social Drivers of Health   Financial Resource Strain: Low Risk  (01/18/2024)   Overall Financial Resource Strain (CARDIA)    Difficulty of Paying Living Expenses: Not hard at all  Food Insecurity: No Food Insecurity (01/18/2024)   Hunger Vital Sign    Worried About Running Out of Food in the Last Year: Never true    Ran Out of Food in the Last Year: Never true  Transportation Needs: No Transportation Needs (01/18/2024)   PRAPARE - Administrator, Civil Service (Medical): No    Lack of Transportation (Non-Medical): No  Physical Activity: Insufficiently Active (01/18/2024)   Exercise Vital Sign    Days of Exercise per Week: 2 days    Minutes of Exercise per Session: 20 min  Stress: Stress Concern Present (01/18/2024)   [employer] of Occupational Health - Occupational Stress Questionnaire    Feeling of Stress: To some extent  Social Connections: Moderately Integrated (01/18/2024)   Social Connection and Isolation Panel    Frequency of Communication with Friends and Family: Once a week    Frequency of Social Gatherings with Friends and Family: Once a week    Attends Religious Services: More than 4 times per year    Active Member of [employer] or Organizations: Yes    Attends Engineer, structural: More than 4 times per year    Marital Status: Married  Catering manager Violence: Not on file    Past Surgical History:  Procedure Laterality Date   CYSTOSCOPY WITH RETROGRADE PYELOGRAM, URETEROSCOPY AND STENT PLACEMENT Left 07/13/2021   Procedure: CYSTOSCOPY WITH RETROGRADE PYELOGRAM, AND STENT PLACEMENT;  Surgeon: Gaston Hamilton, MD;  Location: [facility];  Service: Urology;  Laterality: Left;   CYSTOSCOPY/URETEROSCOPY/HOLMIUM LASER/STENT PLACEMENT Left 07/25/2021   Procedure: CYSTOSCOPY/RETROGRADE/URETEROSCOPY/HOLMIUM LASER/STENT EXCHANGED;  Surgeon: Selma Donnice SAUNDERS, MD;  Location: [facility];  Service: Urology;  Laterality: Left;    EXTRACORPOREAL SHOCK WAVE LITHOTRIPSY Left 07/07/2021   Procedure: EXTRACORPOREAL SHOCK WAVE LITHOTRIPSY (ESWL);  Surgeon: Matilda Senior, MD;  Location: [facility];  Service: Urology;  Laterality: Left;   WISDOM TOOTH EXTRACTION     as child    Family History  Problem Relation Age of Onset   Healthy Mother        Living   Healthy Father        Living   Thyroid  disease Brother    Asthma Daughter    Colon polyps Neg Hx    Colon cancer Neg Hx     Allergies  Allergen Reactions   Hydrocodone  Itching and Other (See Comments)    Hypersensitivity with whole pill    Current Outpatient Medications on File  Prior to Visit  Medication Sig Dispense Refill   busPIRone  (BUSPAR ) 7.5 MG tablet Take 1 tablet (7.5 mg total) by mouth 2 (two) times daily. 180 tablet 0   lisinopril  (ZESTRIL ) 5 MG tablet Take 1 tablet (5 mg total) by mouth daily. 90 tablet 0   pantoprazole  (PROTONIX ) 40 MG tablet TAKE 1 TABLET BY MOUTH EVERY DAY 90 tablet 1   traZODone  (DESYREL ) 50 MG tablet TAKE 1 TABLET BY MOUTH EVERYDAY AT BEDTIME 90 tablet 0   Vitamin D , Ergocalciferol , (DRISDOL ) 1.25 MG (50000 UNIT) CAPS capsule TAKE 1 CAPSULE (50,000 UNITS TOTAL) BY MOUTH EVERY 7 (SEVEN) DAYS 12 capsule 3   No current facility-administered medications on file prior to visit.    BP 120/82   Pulse 67   Temp 98.7 F (37.1 C) (Oral)   Resp 16   Ht 6' 1 (1.854 m)   Wt 221 lb 12.8 oz (100.6 kg)   SpO2 97%   BMI 29.26 kg/m        Objective:   Physical Exam  General Mental Status- Alert. General Appearance- Not in acute distress.   Skin General: Color- Normal Color. Moisture- Normal Moisture.  Neck Carotid Arteries- Normal color. Moisture- Normal Moisture. No carotid bruits. No JVD.  Chest and Lung Exam Auscultation: Breath Sounds:-CTA  Cardiovascular Auscultation:Rythm- RRR Murmurs & Other Heart Sounds:Auscultation of the heart reveals- No Murmurs.  Abdomen Inspection:-Inspeection  Normal. Palpation/Percussion:Note:No mass. Palpation and Percussion of the abdomen reveal- Non Tender, Non Distended + BS, no rebound or guarding.   Neurologic Cranial Nerve exam:- CN III-XII intact(No nystagmus), symmetric smile.Normal strength both upper and lower extremities.      Assessment & Plan:   Patient Instructions  For you wellness exam today I have ordered cbc, cmp, psa and lipid panel.  Vaccine declined  Recommend exercise and healthy diet.  We will let you know lab results as they come in.  Follow up date appointment will be determined after lab review.     Right index finger pain and decreased range of motion, likely osteoarthritis versus gout Chronic pain and decreased range of motion, possibly osteoarthritis or gout. X-ray was not done. - Consider uric acid and inflammation studies if symptoms worsen. Can place xray order again if you want. let me know. - Monitor symptoms and report if pain or swelling increases.   Low libido Low libido for 4-6 months, possibly linked to Celexa . Testosterone  levels to be evaluated. Celexa  discontinued due to side effects. Buspar  continued for anxiety. - Order testosterone  panel early morning to avoid falsely low readings. - Discontinue Celexa . - Continue Buspar  for anxiety management.  Chronic itching of external ear canal Chronic itching of external ear canal. Hydrocortisone  to see if gives  relief. Cortisporin Otic drops prescribed. ENT referral for further evaluation. - Prescribe Cortisporin Otic drops. - Refer to ENT for further evaluation.  Eustachian tube dysfunction with intermittent ear fluid sensation Intermittent ear fluid sensation possibly due to eustachian tube dysfunction associated with allergies. - Recommend daily use of Flonase , two sprays each night.  Deviated nasal septum Chronic difficulty breathing through the right side, possibly contributing to sinus infections and eustachian tube dysfunction. -  Refer to ENT for further evaluation.  Allergic rhinitis Sporadic allergy symptoms managed with Xyzal  and nasal spray as needed. - Continue Xyzal  and nasal spray as needed.  Generalized anxiety disorder Chronic anxiety managed with Buspar . Celexa  discontinued due to side effects and lack of chronic anxiety. GAD-7 score of 5 indicates mild anxiety. -  Continue Buspar  for anxiety management. - Discontinue Celexa .  Gastroesophageal reflux disease (GERD) GERD managed with pantoprazole . - Continue pantoprazole  for GERD management.  Skin lesions (possible solar lentigo and irregular thoracic lesion) Presence of probable solar lentigo and one irregular thoracic lesion. Routine skin evaluation needed. - Refer to [facility] for routine skin evaluation.  General Health Maintenance Up to date on colonoscopy. PSA to be ordered for baseline screening. Routine wellness exam labs to be conducted. - Order PSA for baseline screening. - Conduct CBC, metabolic panel, and lipid panel as part of wellness exam.     Follow up date to be determined after lab review.  Dallas Maxwell, PA-C   [id number] charge   as did address addional dx/signs/symptoms.

## 2024-01-23 NOTE — Patient Instructions (Addendum)
 For you wellness exam today I have ordered cbc, cmp, psa and lipid panel.  Vaccine declined  Recommend exercise and healthy diet.  We will let you know lab results as they come in.  Follow up date appointment will be determined after lab review.     Right index finger pain and decreased range of motion, likely osteoarthritis versus gout Chronic pain and decreased range of motion, possibly osteoarthritis or gout. X-ray was not done. - Consider uric acid and inflammation studies if symptoms worsen. Can place xray order again if you want. let me know. - Monitor symptoms and report if pain or swelling increases.   Low libido Low libido for 50 months, possibly linked to Celexa . Testosterone  levels to be evaluated. Celexa  discontinued due to side effects. Buspar  continued for anxiety. - Order testosterone  panel early morning to avoid falsely low readings. - Discontinue Celexa . - Continue Buspar  for anxiety management.  Chronic itching of external ear canal Chronic itching of external ear canal. Hydrocortisone  to see if gives  relief. Cortisporin Otic drops prescribed. ENT referral for further evaluation. - Prescribe Cortisporin Otic drops. - Refer to ENT for further evaluation.  Eustachian tube dysfunction with intermittent ear fluid sensation Intermittent ear fluid sensation possibly due to eustachian tube dysfunction associated with allergies. - Recommend daily use of Flonase , two sprays each night.  Deviated nasal septum Chronic difficulty breathing through the right side, possibly contributing to sinus infections and eustachian tube dysfunction. - Refer to ENT for further evaluation.  Allergic rhinitis Sporadic allergy symptoms managed with Xyzal  and nasal spray as needed. - Continue Xyzal  and nasal spray as needed.  Generalized anxiety disorder Chronic anxiety managed with Buspar . Celexa  discontinued due to side effects and lack of chronic anxiety. GAD-7 score of 5 indicates  mild anxiety. - Continue Buspar  for anxiety management. - Discontinue Celexa .  Gastroesophageal reflux disease (GERD) GERD managed with pantoprazole . - Continue pantoprazole  for GERD management.  Skin lesions (possible solar lentigo and irregular thoracic lesion) Presence of probable solar lentigo and one irregular thoracic lesion. Routine skin evaluation needed. - Refer to [facility] for routine skin evaluation.  General Health Maintenance Up to date on colonoscopy. PSA to be ordered for baseline screening. Routine wellness exam labs to be conducted. - Order PSA for baseline screening. - Conduct CBC, metabolic panel, and lipid panel as part of wellness exam.  Follow up date to be determined after lab review.   Preventive Care 50-23 Years Old, Male Preventive care refers to lifestyle choices and visits with your health care provider that can promote health and wellness. Preventive care visits are also called wellness exams. What can I expect for my preventive care visit? Counseling During your preventive care visit, your health care provider may ask about your: Medical history, including: Past medical problems. Family medical history. Current health, including: Emotional well-being. Home life and relationship well-being. Sexual activity. Lifestyle, including: Alcohol, nicotine or tobacco, and drug use. Access to firearms. Diet, exercise, and sleep habits. Safety issues such as seatbelt and bike helmet use. Sunscreen use. Work and work Astronomer. Physical exam Your health care provider will check your: Height and weight. These may be used to calculate your BMI (body mass index). BMI is a measurement that tells if you are at a healthy weight. Waist circumference. This measures the distance around your waistline. This measurement also tells if you are at a healthy weight and may help predict your risk of certain diseases, such as type 2 diabetes and high blood  pressure. Heart rate and blood pressure. Body temperature. Skin for abnormal spots. What immunizations do I need?  Vaccines are usually given at various ages, according to a schedule. Your health care provider will recommend vaccines for you based on your age, medical history, and lifestyle or other factors, such as travel or where you work. What tests do I need? Screening Your health care provider may recommend screening tests for certain conditions. This may include: Lipid and cholesterol levels. Diabetes screening. This is done by checking your blood sugar (glucose) after you have not eaten for a while (fasting). Hepatitis B test. Hepatitis C test. HIV (human immunodeficiency virus) test. STI (sexually transmitted infection) testing, if you are at risk. Lung cancer screening. Prostate cancer screening. Colorectal cancer screening. Talk with your health care provider about your test results, treatment options, and if necessary, the need for more tests. Follow these instructions at home: Eating and drinking  Eat a diet that includes fresh fruits and vegetables, whole grains, lean protein, and low-fat dairy products. Take vitamin and mineral supplements as recommended by your health care provider. Do not drink alcohol if your health care provider tells you not to drink. If you drink alcohol: Limit how much you have to 0-2 drinks a day. Know how much alcohol is in your drink. In the [place] one drink equals one 12 oz bottle of beer (355 mL), one 5 oz glass of wine (148 mL), or one 1 oz glass of hard liquor (44 mL). Lifestyle Brush your teeth every morning and night with fluoride toothpaste. Floss one time each day. Exercise for at least 30 minutes 5 or more days each week. Do not use any products that contain nicotine or tobacco. These products include cigarettes, chewing tobacco, and vaping devices, such as e-cigarettes. If you need help quitting, ask your health care provider. Do not  use drugs. If you are sexually active, practice safe sex. Use a condom or other form of protection to prevent STIs. Take aspirin only as told by your health care provider. Make sure that you understand how much to take and what form to take. Work with your health care provider to find out whether it is safe and beneficial for you to take aspirin daily. Find healthy ways to manage stress, such as: Meditation, yoga, or listening to music. Journaling. Talking to a trusted person. Spending time with friends and family. Minimize exposure to UV radiation to reduce your risk of skin cancer. Safety Always wear your seat belt while driving or riding in a vehicle. Do not drive: If you have been drinking alcohol. Do not ride with someone who has been drinking. When you are tired or distracted. While texting. If you have been using any mind-altering substances or drugs. Wear a helmet and other protective equipment during sports activities. If you have firearms in your house, make sure you follow all gun safety procedures. What's next? Go to your health care provider once a year for an annual wellness visit. Ask your health care provider how often you should have your eyes and teeth checked. Stay up to date on all vaccines. This information is not intended to replace advice given to you by your health care provider. Make sure you discuss any questions you have with your health care provider. Document Revised: 11/17/2020 Document Reviewed: 11/17/2020 Elsevier Patient Education  2024 [employer].

## 2024-01-24 NOTE — Addendum Note (Signed)
 Addended by: DORINA DALLAS HERO on: 01/24/2024 09:08 AM   Modules accepted: Orders

## 2024-02-21 ENCOUNTER — Other Ambulatory Visit: Payer: Self-pay | Admitting: Medical

## 2024-03-06 ENCOUNTER — Other Ambulatory Visit: Payer: Self-pay | Admitting: Medical

## 2024-03-18 ENCOUNTER — Encounter: Payer: Self-pay | Admitting: Dietician

## 2024-03-18 ENCOUNTER — Encounter: Attending: Medical | Admitting: Dietician

## 2024-03-18 VITALS — Wt 225.0 lb

## 2024-03-18 DIAGNOSIS — E785 Hyperlipidemia, unspecified: Secondary | ICD-10-CM | POA: Insufficient documentation

## 2024-03-18 NOTE — Progress Notes (Signed)
 Medical Nutrition Therapy  Appointment Start time:  [phone]  Appointment End time:  1015  Primary concerns today: bringing cholesterol down   Referral diagnosis: HLD Preferred learning style: no preference indicated Learning readiness: ready   NUTRITION ASSESSMENT   Anthropometrics   Wt 03/18/24: 225 lb  Clinical Medical Hx: asthma, depression, GERD, HLD Medications: reviewed Labs: 01/23/24: chol 256, LDL 190 Notable Signs/Symptoms: none reported Food Allergies: none reported  Lifestyle & Dietary Hx  Pt reports he is normally around 205 lb and would like to lose 15 lb. Pt states he recently had labs done and cholesterol/LDL were high and wants to work toward bringing these down to avoid medication.   Pt reports he fasts for 16 hours once a week and finds it brings him mental clarity.   Pt states a few years ago he had a kidney stone and since then has cut out sweet tea and dark sodas. Pt reports he has sprite around 6 times a month when eating out. Pt reports drinking beer socially (maybe once a month). Otherwise most fluid intake is water (reports he feels water intake is low), and one cup coffee.   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 Pt states work is high stress.   Pt reports his daughter (77) is in volleyball so about 4 nights a week right now they are having quick dinners, eating out more, but feels this will change after volleyball season.   Pt reports he does yoga daily for 30 minutes, and does 15 min strength training 3x/wk.   Estimated daily fluid intake: 48 oz Supplements: vitamin D , vitamin K2 Sleep: 10pm-3am. Wakes up early and can't fall back asleep.  Stress / self-care: moderate to high stress; reports monthly massage, outdoor activities, and yoga all help with stress relief.  Current average weekly physical activity: yoga and free weights 3x/wk  24-Hr Dietary Recall First Meal:  skips Snack: none Second Meal: ham sandwiches and chips Snack: granola bar and beef stick Third Meal: home: protein, vegetable, starch OR chickfila (12 piece nugget, fries, sprite), [place] restaurant Snack: popcorn OR cookies Beverages: coffee, 48 oz water, sparkling water, sprite 6 times a month   NUTRITION DIAGNOSIS  NB-1.1 Food and nutrition-related knowledge deficit As related to lack of prior education by a registered dietitian.  As evidenced by pt report.   NUTRITION INTERVENTION  Nutrition education (E-1) on the following topics:   LDL lowering nutrition therapy:  Soluble fiber and impact on lowering cholesterol: Sources: Oats, barley, beans, lentils, fruits (apples, oranges), vegetables (carrots, broccoli), and flaxseeds. Benefits: Soluble fiber reduces the absorption of cholesterol into your bloodstream. Choose Healthy Unsaturated Fats and Omega 3's. Sources: Olive oil, canola oil, avocados, nuts, and Fatty fish (salmon, trout), walnuts, flaxseeds, and chia seeds. Benefits: Helps raise HDL cholesterol and lower LDL cholesterol. Omega-3s help reduce triglycerides and raise HDL cholesterol. Limit Saturated Fats: Sources: Red meat, full-fat dairy products, butter, and coconut oil. Benefits: Reducing intake helps lower LDL cholesterol levels. Eat Plenty of Fruits and Vegetables. Benefits: High in fiber and antioxidants, which help lower LDL cholesterol. Eat Whole Grains. Sources: Brown rice, whole wheat bread, quinoa, and whole grain pasta. Benefits: Whole grains contain more fiber and nutrients than refined grains. Regular Physical Activity: Aim for at least 30 minutes of moderate-intensity exercise most days of the week.  Plate Method Food Groups Fruits & Vegetables: Aim to fill half your plate  with a variety of fruits and vegetables. They are rich in vitamins, minerals, and fiber, and can help reduce the risk of chronic diseases. Choose a colorful assortment of fruits and vegetables  to ensure you get a wide range of nutrients. Grains and Starches: Make at least half of your grain choices whole grains, such as brown rice, whole wheat bread, and oats. Whole grains provide fiber, which aids in digestion and healthy cholesterol levels. Aim for whole forms of starchy vegetables such as potatoes, sweet potatoes, beans, peas, and corn, which are fiber rich and provide many vitamins and minerals.  Protein: Incorporate lean sources of protein, such as poultry, fish, beans, nuts, and seeds, into your meals. Protein is essential for building and repairing tissues, staying full, balancing blood sugar, as well as supporting immune function. Dairy: Include low-fat or fat-free dairy products like milk, yogurt, and cheese in your diet. Dairy foods are excellent sources of calcium and vitamin D , which are crucial for bone health.    Handouts Provided Include  Plate Method Nutrition Care Manual: LDL-Lowering Nutrition Therapy  Learning Style & Readiness for Change Teaching method utilized: Visual & Auditory  Demonstrated degree of understanding via: Teach Back  Barriers to learning/adherence to lifestyle change: none  Goals Established by Pt  Goal 1: take more time to plan your meal each day.   Goal 2: pack a vegetable to have with lunch.   Goal 3: eat beans at least 1-2 times a week.  Increase more of these foods for LDL lowering: all beans/lentils/peas, oats, berries, whole grains, chia seeds, flax seeds.   MONITORING & EVALUATION Dietary intake, weekly physical activity, and follow up in 6 weeks.  Next Steps  Patient is to call for questions.

## 2024-03-18 NOTE — Patient Instructions (Signed)
 Goals Established by Pt  Goal 1: take more time to plan your meal each day.   Goal 2: pack a vegetable to have with lunch.   Goal 3: eat beans at least 1-2 times a week.  Increase more of these foods for LDL lowering: all beans/lentils/peas, oats, berries, whole grains, chia seeds, flax seeds.

## 2024-03-31 ENCOUNTER — Ambulatory Visit (INDEPENDENT_AMBULATORY_CARE_PROVIDER_SITE_OTHER): Admitting: Otolaryngology

## 2024-03-31 ENCOUNTER — Ambulatory Visit (INDEPENDENT_AMBULATORY_CARE_PROVIDER_SITE_OTHER): Admitting: Audiology

## 2024-03-31 ENCOUNTER — Encounter (INDEPENDENT_AMBULATORY_CARE_PROVIDER_SITE_OTHER): Payer: Self-pay | Admitting: Otolaryngology

## 2024-03-31 VITALS — BP 153/96 | HR 67 | Ht 73.0 in | Wt 220.0 lb

## 2024-03-31 DIAGNOSIS — J3489 Other specified disorders of nose and nasal sinuses: Secondary | ICD-10-CM | POA: Diagnosis not present

## 2024-03-31 DIAGNOSIS — H608X3 Other otitis externa, bilateral: Secondary | ICD-10-CM

## 2024-03-31 DIAGNOSIS — H903 Sensorineural hearing loss, bilateral: Secondary | ICD-10-CM

## 2024-03-31 DIAGNOSIS — H938X3 Other specified disorders of ear, bilateral: Secondary | ICD-10-CM

## 2024-03-31 DIAGNOSIS — J343 Hypertrophy of nasal turbinates: Secondary | ICD-10-CM

## 2024-03-31 DIAGNOSIS — J342 Deviated nasal septum: Secondary | ICD-10-CM

## 2024-03-31 DIAGNOSIS — H6993 Unspecified Eustachian tube disorder, bilateral: Secondary | ICD-10-CM

## 2024-03-31 DIAGNOSIS — H93293 Other abnormal auditory perceptions, bilateral: Secondary | ICD-10-CM | POA: Diagnosis not present

## 2024-03-31 DIAGNOSIS — Z0110 Encounter for examination of ears and hearing without abnormal findings: Secondary | ICD-10-CM

## 2024-03-31 DIAGNOSIS — R0981 Nasal congestion: Secondary | ICD-10-CM

## 2024-03-31 MED ORDER — BETAMETHASONE DIPROPIONATE 0.05 % EX CREA: TOPICAL_CREAM | Freq: Two times a day (BID) | CUTANEOUS | 0 refills | Status: AC

## 2024-03-31 MED ORDER — AZELASTINE HCL 0.1 % NA SOLN: 2.0000 | Freq: Two times a day (BID) | NASAL | 12 refills | Status: AC

## 2024-03-31 MED ORDER — FLUOCINOLONE ACETONIDE 0.01 % OT OIL: 4.0000 [drp] | TOPICAL_OIL | Freq: Two times a day (BID) | OTIC | 1 refills | Status: AC

## 2024-03-31 NOTE — Patient Instructions (Addendum)
 Use two sprays of flonase  in each nostril twice per day  right after, use astelin  spray two sprays each nostril twice per day Pop your ears multiple times per day  If nasal symptoms are bad, can use Neti Pot.   Use dermotic drops 4 drops each ear twice daily followed by betamethasone ointment (just to outside of ear canal) twice daily for 10 days. Then use them once a day two days out of the week.

## 2024-03-31 NOTE — Progress Notes (Signed)
  [address] [place], [place] [address] [phone]  Audiological Evaluation    Name: Kyle Miller     DOB:   [birth year]      MRN:   [id number]                                                                                     Service Date: 03/31/2024     Accompanied by: unaccompanied   Patient comes today after Dr. Tobie, ENT sent a referral for a hearing evaluation due to concerns with fluid in his ears.   Symptoms Yes Details  Hearing loss  []    Tinnitus  []    Ear pain/ infections/pressure  [x]  Reports fluid in his ears - noticed with seasonal changes  Balance problems  []    Noise exposure history  [x]  In the past some heavy equipment.  Previous ear surgeries  []    Family history of hearing loss  []    Amplification  []    Other  []      Otoscopy: Right ear: Clear external ear canal and notable landmarks visualized on the tympanic membrane. Left ear:  Clear external ear canal and notable landmarks visualized on the tympanic membrane.  Tympanometry: Right ear: Normal external ear canal volume with normal middle ear pressure and tympanic membrane compliance (Type A). Findings are suggestive of normal middle ear function.  Left ear: Normal external ear canal volume with normal middle ear pressure and tympanic membrane compliance (Type A). Findings are suggestive of normal middle ear function.  Hearing Evaluation The hearing test results were completed under headphones and results are deemed to be of good reliability. Test technique:  conventional    Pure tone Audiometry: Both ears- Normal hearing from [phone] .  Speech Audiometry: Right ear- Speech Reception Threshold (SRT) was obtained at 5 dBHL. Left ear-Speech Reception Threshold (SRT) was obtained at 5 dBHL.   Word Recognition Score Tested using NU-6 (recorded) Right ear: 100% was obtained at a presentation level of 45 dBHL with contralateral masking which is deemed as  excellent. Left ear: 100%  was obtained at a presentation level of 45 dBHL with contralateral masking which is deemed as  excellent.   Impression: There is not a significant difference in pure-tone thresholds between ears. There is not a significant difference in the word recognition score in between ears.    Recommendations: Follow up with ENT as scheduled for today.  Return for a hearing evaluation if concerns with hearing changes arise or per MD recommendation.   Mekhi Sonn MARIE LEROUX-MARTINEZ, AUD

## 2024-03-31 NOTE — Progress Notes (Signed)
 Dear Dr. Dorina, Here is my assessment for our mutual patient, Kyle Miller. Thank you for allowing me the opportunity to care for your patient. Please do not hesitate to contact me should you have any other questions. Sincerely, Dr. Eldora Blanch  Otolaryngology Clinic Note Referring provider: Dr. Dorina HPI:  Discussed the use of AI scribe software for clinical note transcription with the patient, who gave verbal consent to proceed.  History of Present Illness Kyle Miller is a 48 year old male who presents with seasonal ear fullness and itching.  He experiences significant ear congestion and itching, particularly during seasonal changes from cold to warm weather. The congestion is severe when transitioning from cold environments to warm ones. This issue has persisted for approximately four to five years and occurs intermittently. During these episodes, he feels a sensation of fluid behind his ear, especially prominent when lying down. Muffled hearing and ear fullness also exist. The ear itching is more chronic and persistent, localized to the outside of ear canal,, and is described as severe. Triamcinolone  ointment provides relief for about a week, while previously prescribed ear drops were ineffective. He also uses flonase  which helps with ear fullness. Otherwise denies sinonasal sx except for nasal obstruction, attributed to a deviated septum. Mild, constant tinnitus is present. No history of sinus infections, facial pain/pressure, need for abx/steroids, ear pain, ear drainage, or ear surgeries. Has had prior allergy testing, unclear what positives.  Patient additionally denies: deep pain in ear canal, eustachian tube symptoms such as popping, crackling, sensitive to pressure changes Patient also denies barotrauma, vestibular suppressant use, ototoxic medication use Prior ear surgery: no  Currently he is doing fairly well  H&N Surgery: no Personal or FHx of bleeding dz or anesthesia  difficulty: no  GLP-1: no AP/AC: no  Tobacco: no  PMHx: HTN, Asthma, GAD, GERD  Independent Review of Additional Tests or Records:  [name] (01/23/2024): noted chronic itching of external ear canal, and ETD with intermittent ear fluid sensation with deviated septum with difficulty breathing through right side. Dx: as above; Rx: floase, cortisporin drops Zelda Fleming 10/25/2023: evisit for Sinusitis it appears - Rx: augmentin  Dr. Carlie 02/20/2017: noted congestion and sinus problems w/ h/o allergies; Noted left serous otitis media with CHL, allergies thought to contribute.  Labs CBC and CMP 01/23/2024: BUN/Cr 11/0.95; WBC 7.1, Eos 100 03/2024 Audiogram was independently reviewed and interpreted by me and it reveals -- exsentially normal hearing thresholds except 8kHz borderline AU; A/A tymps; WRT 100% AU at 45dB   SNHL= Sensorineural hearing loss   PMH/Meds/All/SocHx/FamHx/ROS:   Past Medical History:  Diagnosis Date   Asthma    Bulging lumbar disc    Depression    GERD (gastroesophageal reflux disease)    History of chicken pox    as child   History of kidney stones    Severe Sepsis (HCC) 07/13/2021   admitted x 2 days     Past Surgical History:  Procedure Laterality Date   CYSTOSCOPY WITH RETROGRADE PYELOGRAM, URETEROSCOPY AND STENT PLACEMENT Left 07/13/2021   Procedure: CYSTOSCOPY WITH RETROGRADE PYELOGRAM, AND STENT PLACEMENT;  Surgeon: Gaston Hamilton, MD;  Location: [facility];  Service: Urology;  Laterality: Left;   CYSTOSCOPY/URETEROSCOPY/HOLMIUM LASER/STENT PLACEMENT Left 07/25/2021   Procedure: CYSTOSCOPY/RETROGRADE/URETEROSCOPY/HOLMIUM LASER/STENT EXCHANGED;  Surgeon: Selma Donnice SAUNDERS, MD;  Location[facility];  Service: Urology;  Laterality: Left;   EXTRACORPOREAL SHOCK WAVE LITHOTRIPSY Left 07/07/2021   Procedure: EXTRACORPOREAL SHOCK WAVE LITHOTRIPSY (ESWL);  Surgeon: Matilda Senior, MD;  Location: [facility];  Service:  Urology;  Laterality: Left;   WISDOM TOOTH EXTRACTION     as child    Family History  Problem Relation Age of Onset   Healthy Mother        Living   Healthy Father        Living   Thyroid  disease Brother    Asthma Daughter    Colon polyps Neg Hx    Colon cancer Neg Hx      Social Connections: Moderately Integrated (01/18/2024)   Social Connection and Isolation Panel    Frequency of Communication with Friends and Family: Once a week    Frequency of Social Gatherings with Friends and Family: Once a week    Attends Religious Services: More than 4 times per year    Active Member of [employer] or Organizations: Yes    Attends Engineer, Structural: More than 4 times per year    Marital Status: Married      Current Outpatient Medications:    azelastine  (ASTELIN ) 0.1 % nasal spray, Place 2 sprays into both nostrils 2 (two) times daily. Use in each nostril as directed, Disp: 30 mL, Rfl: 12   betamethasone dipropionate 0.05 % cream, Apply topically 2 (two) times daily for 10 days., Disp: 30 g, Rfl: 0   busPIRone  (BUSPAR ) 7.5 MG tablet, Take 1 tablet (7.5 mg total) by mouth 2 (two) times daily., Disp: 180 tablet, Rfl: 1   citalopram  (CELEXA ) 10 MG tablet, Take 10 mg by mouth daily., Disp: , Rfl:    Fluocinolone Acetonide (DERMOTIC) 0.01 % OIL, Place 4 drops in ear(s) in the morning and at bedtime for 10 days., Disp: 20 mL, Rfl: 1   lisinopril  (ZESTRIL ) 5 MG tablet, Take 1 tablet (5 mg total) by mouth daily., Disp: 90 tablet, Rfl: 1   neomycin -polymyxin-hydrocortisone  (CORTISPORIN) OTIC solution, Place 3 drops into both ears 3 (three) times daily., Disp: 10 mL, Rfl: 0   pantoprazole  (PROTONIX ) 40 MG tablet, TAKE 1 TABLET BY MOUTH EVERY DAY, Disp: 90 tablet, Rfl: 1   traZODone  (DESYREL ) 50 MG tablet, Take 1 tablet (50 mg total) by mouth at bedtime., Disp: 90 tablet, Rfl: 1   Vitamin D , Ergocalciferol , (DRISDOL ) 1.25 MG (50000 UNIT) CAPS capsule, TAKE 1 CAPSULE (50,000 UNITS TOTAL) BY  MOUTH EVERY 7 (SEVEN) DAYS, Disp: 12 capsule, Rfl: 3   Physical Exam:   BP (!) 153/96 (BP Location: Left Arm, Patient Position: Sitting, Cuff Size: Large)   Pulse 67   Ht 6' 1 (1.854 m)   Wt 220 lb (99.8 kg)   SpO2 96%   BMI 29.03 kg/m   Salient findings:  CN II-XII intact  Bilateral EAC clear and TM intact with well pneumatized middle ear spaces; bilateral eczematoid ear change Weber 512: mid Rinne 512: AC > BC b/l  Anterior rhinoscopy: Septum deviates right; bilateral inferior turbinates with left > right hypertrophy No lesions of oral cavity/oropharynx; mild TMJ crepitus No obviously palpable neck masses/lymphadenopathy/thyromegaly No respiratory distress or stridor  Seprately Identifiable Procedures:  Prior to initiating any procedures, risks/benefits/alternatives were explained to the patient and verbal consent obtained. None  Impression & Plans:  Cason Taliercio is a 48 y.o. male with:  1. Dysfunction of both eustachian tubes   2. Chronic eczematous otitis externa of both ears   3. Nasal obstruction   4. Hypertrophy of both inferior nasal turbinates   5. Nasal septal deviation   6. Nasal congestion   7. Sensation  of fullness in both ears   8. Sensorineural hearing loss, bilateral    Appears sx more seasonal, and seem consistent with ETD. We discussed options and makes most sense to do seasonal (regular) nasal sprays with autoinsufflation. For ezcematoid OE, will treat. Septal deviation - he feels like he is doing ok from breathing standpoint so will hold off on any intervention  - Prescribe betamethasone ointment for application to the external ear twice daily for 10 days. - Prescribe Dermotic drops, 4 drops in each ear twice daily for 10 days. - After initial treatment, use betamethasone ointment and Dermotic drops once a day, two days a week for maintenance. - Continue using Flonase  nasal spray regularly around season changes. - Prescribe Astelin  spray to be used with  Flonase  during seasonal changes. - Recommend using a rinses for nasal symptoms during seasonal changes. - Advise performing ear popping exercises multiple times a day.  See below regarding exact medications prescribed this encounter including dosages and route: Meds ordered this encounter  Medications   azelastine  (ASTELIN ) 0.1 % nasal spray    Sig: Place 2 sprays into both nostrils 2 (two) times daily. Use in each nostril as directed    Dispense:  30 mL    Refill:  12   Fluocinolone Acetonide (DERMOTIC) 0.01 % OIL    Sig: Place 4 drops in ear(s) in the morning and at bedtime for 10 days.    Dispense:  20 mL    Refill:  1   betamethasone dipropionate 0.05 % cream    Sig: Apply topically 2 (two) times daily for 10 days.    Dispense:  30 g    Refill:  0      Thank you for allowing me the opportunity to care for your patient. Please do not hesitate to contact me should you have any other questions.  Sincerely, Eldora Blanch, MD Otolaryngologist (ENT), [facility] Phone: [phone] Fax: [phone]  03/31/2024, 11:02 AM   MDM:  Level 4 - [phone] Complexity/Problems addressed: mod - chronic problems Data complexity: mod - independent review of note, labs, ordering test - Morbidity: mod  - Prescription Drug prescribed or managed: y

## 2024-04-09 ENCOUNTER — Other Ambulatory Visit: Payer: Self-pay | Admitting: Medical

## 2024-04-27 ENCOUNTER — Other Ambulatory Visit: Payer: Self-pay | Admitting: Medical

## 2024-04-29 NOTE — Telephone Encounter (Signed)
 Have pt schedule follow up visit with me. Vit d rx refill given. If needs refill need vit d level before next refill

## 2024-04-29 NOTE — Telephone Encounter (Signed)
 Called pt and notified him of pcp advice and needing a lab visit for future vit D refill in two months. Pt says he will look at his schedule and schedule an appointment via mychart

## 2024-05-20 ENCOUNTER — Encounter: Admitting: Dietician

## 2024-07-04 ENCOUNTER — Ambulatory Visit: Admitting: Medical

## 2024-07-30 ENCOUNTER — Ambulatory Visit: Admitting: Medical
